# Patient Record
Sex: Female | Born: 1950 | Race: White | Hispanic: No | Marital: Married | Smoking: Never smoker
Health system: Southern US, Community
[De-identification: ages and names within clinical notes are randomized; demographics above are authoritative.]

## PROBLEM LIST (undated history)

## (undated) DIAGNOSIS — T8859XA Other complications of anesthesia, initial encounter: Secondary | ICD-10-CM

## (undated) DIAGNOSIS — K219 Gastro-esophageal reflux disease without esophagitis: Secondary | ICD-10-CM

## (undated) DIAGNOSIS — G4730 Sleep apnea, unspecified: Secondary | ICD-10-CM

## (undated) DIAGNOSIS — T4145XA Adverse effect of unspecified anesthetic, initial encounter: Secondary | ICD-10-CM

## (undated) DIAGNOSIS — E039 Hypothyroidism, unspecified: Secondary | ICD-10-CM

## (undated) DIAGNOSIS — F419 Anxiety disorder, unspecified: Secondary | ICD-10-CM

## (undated) DIAGNOSIS — M1990 Unspecified osteoarthritis, unspecified site: Secondary | ICD-10-CM

## (undated) DIAGNOSIS — K3533 Acute appendicitis with perforation and localized peritonitis, with abscess: Secondary | ICD-10-CM

## (undated) DIAGNOSIS — K3532 Acute appendicitis with perforation and localized peritonitis, without abscess: Secondary | ICD-10-CM

## (undated) HISTORY — PX: CHOLECYSTECTOMY: SHX55

## (undated) HISTORY — PX: REPLACEMENT TOTAL KNEE BILATERAL: SUR1225

## (undated) HISTORY — PX: JOINT REPLACEMENT: SHX530

## (undated) HISTORY — DX: Acute appendicitis with perforation, localized peritonitis, and gangrene, with abscess: K35.33

## (undated) HISTORY — PX: BREAST BIOPSY: SHX20

## (undated) HISTORY — PX: TONSILLECTOMY: SUR1361

## (undated) HISTORY — DX: Acute appendicitis with perforation and localized peritonitis, without abscess: K35.32

## (undated) HISTORY — PX: DILATION AND CURETTAGE OF UTERUS: SHX78

## (undated) HISTORY — PX: NASAL SEPTUM SURGERY: SHX37

## (undated) HISTORY — PX: TOTAL HIP ARTHROPLASTY: SHX124

## (undated) HISTORY — PX: APPENDECTOMY: SHX54

## (undated) SURGERY — APPENDECTOMY, LAPAROSCOPIC
Anesthesia: General

---

## 2001-04-07 ENCOUNTER — Other Ambulatory Visit: Admission: RE | Admit: 2001-04-07 | Discharge: 2001-04-07 | Payer: Self-pay | Admitting: Family Medicine

## 2004-09-15 ENCOUNTER — Ambulatory Visit: Payer: Self-pay | Admitting: General Practice

## 2004-10-26 ENCOUNTER — Ambulatory Visit: Payer: Self-pay | Admitting: Internal Medicine

## 2004-11-30 ENCOUNTER — Encounter: Admission: RE | Admit: 2004-11-30 | Discharge: 2005-02-28 | Payer: Self-pay | Admitting: Orthopedic Surgery

## 2005-04-15 ENCOUNTER — Inpatient Hospital Stay: Payer: Self-pay | Admitting: General Practice

## 2005-04-21 ENCOUNTER — Encounter: Payer: Self-pay | Admitting: Internal Medicine

## 2005-07-24 ENCOUNTER — Emergency Department: Payer: Self-pay | Admitting: Unknown Physician Specialty

## 2005-11-03 ENCOUNTER — Ambulatory Visit: Payer: Self-pay | Admitting: Internal Medicine

## 2005-11-25 ENCOUNTER — Ambulatory Visit: Payer: Self-pay | Admitting: Ophthalmology

## 2007-04-24 ENCOUNTER — Ambulatory Visit: Payer: Self-pay | Admitting: General Practice

## 2007-05-11 ENCOUNTER — Inpatient Hospital Stay: Payer: Self-pay | Admitting: General Practice

## 2007-05-18 ENCOUNTER — Encounter: Payer: Self-pay | Admitting: General Practice

## 2007-06-09 ENCOUNTER — Encounter: Payer: Self-pay | Admitting: General Practice

## 2007-07-10 ENCOUNTER — Encounter: Payer: Self-pay | Admitting: General Practice

## 2007-09-06 ENCOUNTER — Ambulatory Visit: Payer: Self-pay | Admitting: Internal Medicine

## 2008-09-09 ENCOUNTER — Ambulatory Visit: Payer: Self-pay | Admitting: Internal Medicine

## 2009-08-07 ENCOUNTER — Ambulatory Visit: Payer: Self-pay | Admitting: Neurology

## 2009-09-11 ENCOUNTER — Ambulatory Visit: Payer: Self-pay | Admitting: Internal Medicine

## 2010-11-20 ENCOUNTER — Ambulatory Visit: Payer: Self-pay | Admitting: Internal Medicine

## 2011-05-21 ENCOUNTER — Ambulatory Visit: Payer: Self-pay | Admitting: Obstetrics and Gynecology

## 2011-06-07 ENCOUNTER — Ambulatory Visit: Payer: Self-pay | Admitting: Obstetrics and Gynecology

## 2011-06-14 ENCOUNTER — Ambulatory Visit: Payer: Self-pay | Admitting: Obstetrics and Gynecology

## 2011-11-30 ENCOUNTER — Ambulatory Visit: Payer: Self-pay | Admitting: Gastroenterology

## 2012-01-13 ENCOUNTER — Ambulatory Visit: Payer: Self-pay | Admitting: Internal Medicine

## 2012-02-09 ENCOUNTER — Ambulatory Visit: Payer: Self-pay | Admitting: Internal Medicine

## 2012-05-31 ENCOUNTER — Other Ambulatory Visit: Payer: Self-pay | Admitting: Physical Medicine and Rehabilitation

## 2012-05-31 DIAGNOSIS — M545 Low back pain: Secondary | ICD-10-CM

## 2012-06-04 ENCOUNTER — Ambulatory Visit
Admission: RE | Admit: 2012-06-04 | Discharge: 2012-06-04 | Disposition: A | Discharge status: Home / Self Care | Payer: BC Managed Care – PPO | Source: Ambulatory Visit | Attending: Physical Medicine and Rehabilitation | Admitting: Physical Medicine and Rehabilitation

## 2012-06-04 DIAGNOSIS — M545 Low back pain: Secondary | ICD-10-CM

## 2012-08-08 ENCOUNTER — Ambulatory Visit: Payer: Self-pay | Admitting: Obstetrics and Gynecology

## 2012-08-08 LAB — URINALYSIS, COMPLETE
Bilirubin,UR: NEGATIVE
Glucose,UR: NEGATIVE mg/dL (ref 0–75)
Hyaline Cast: 1
Nitrite: NEGATIVE
WBC UR: 1 /HPF (ref 0–5)

## 2012-08-14 ENCOUNTER — Ambulatory Visit: Payer: Self-pay | Admitting: Obstetrics and Gynecology

## 2012-08-15 LAB — PATHOLOGY REPORT

## 2014-04-09 ENCOUNTER — Ambulatory Visit: Payer: Self-pay | Admitting: General Practice

## 2014-04-09 LAB — URINALYSIS, COMPLETE
Bilirubin,UR: NEGATIVE
Blood: NEGATIVE
Glucose,UR: NEGATIVE mg/dL (ref 0–75)
Ketone: NEGATIVE
LEUKOCYTE ESTERASE: NEGATIVE
Nitrite: NEGATIVE
PROTEIN: NEGATIVE
Ph: 7 (ref 4.5–8.0)
RBC,UR: <1 /HPF (ref 0–5)
Specific Gravity: 1.008 (ref 1.003–1.030)
WBC UR: <1 /HPF (ref 0–5)

## 2014-04-09 LAB — BASIC METABOLIC PANEL
Anion Gap: 7 (ref 7–16)
BUN: 15 mg/dL (ref 7–18)
CHLORIDE: 101 mmol/L (ref 98–107)
CREATININE: 0.79 mg/dL (ref 0.60–1.30)
Calcium, Total: 9.2 mg/dL (ref 8.5–10.1)
Co2: 30 mmol/L (ref 21–32)
Glucose: 89 mg/dL (ref 65–99)
OSMOLALITY: 276 (ref 275–301)
POTASSIUM: 3.6 mmol/L (ref 3.5–5.1)
SODIUM: 138 mmol/L (ref 136–145)

## 2014-04-09 LAB — PROTIME-INR
INR: 1
Prothrombin Time: 13.1 secs (ref 11.5–14.7)

## 2014-04-09 LAB — CBC
HCT: 42.6 % (ref 35.0–47.0)
HGB: 14.4 g/dL (ref 12.0–16.0)
MCH: 32 pg (ref 26.0–34.0)
MCHC: 33.8 g/dL (ref 32.0–36.0)
MCV: 95 fL (ref 80–100)
Platelet: 214 10*3/uL (ref 150–440)
RBC: 4.51 10*6/uL (ref 3.80–5.20)
RDW: 13.6 % (ref 11.5–14.5)
WBC: 7.2 10*3/uL (ref 3.6–11.0)

## 2014-04-09 LAB — SEDIMENTATION RATE: ERYTHROCYTE SED RATE: 15 mm/h (ref 0–30)

## 2014-04-09 LAB — APTT: ACTIVATED PTT: 36.6 s — AB (ref 23.6–35.9)

## 2014-04-09 LAB — MRSA PCR SCREENING

## 2014-04-11 LAB — URINE CULTURE

## 2014-04-22 ENCOUNTER — Inpatient Hospital Stay: Payer: Self-pay | Admitting: General Practice

## 2014-04-23 LAB — BASIC METABOLIC PANEL
ANION GAP: 5 — AB (ref 7–16)
BUN: 7 mg/dL (ref 7–18)
CREATININE: 0.8 mg/dL (ref 0.60–1.30)
Calcium, Total: 8 mg/dL — ABNORMAL LOW (ref 8.5–10.1)
Chloride: 97 mmol/L — ABNORMAL LOW (ref 98–107)
Co2: 29 mmol/L (ref 21–32)
EGFR (African American): >60
GLUCOSE: 110 mg/dL — AB (ref 65–99)
OSMOLALITY: 261 (ref 275–301)
Potassium: 3.9 mmol/L (ref 3.5–5.1)
SODIUM: 131 mmol/L — AB (ref 136–145)

## 2014-04-23 LAB — HEMOGLOBIN: HGB: 11.6 g/dL — ABNORMAL LOW (ref 12.0–16.0)

## 2014-04-23 LAB — PLATELET COUNT: PLATELETS: 187 10*3/uL (ref 150–440)

## 2014-04-24 LAB — BASIC METABOLIC PANEL
Anion Gap: 6 — ABNORMAL LOW (ref 7–16)
BUN: 6 mg/dL — AB (ref 7–18)
CALCIUM: 7.9 mg/dL — AB (ref 8.5–10.1)
CO2: 28 mmol/L (ref 21–32)
Chloride: 99 mmol/L (ref 98–107)
Creatinine: 0.74 mg/dL (ref 0.60–1.30)
Glucose: 106 mg/dL — ABNORMAL HIGH (ref 65–99)
Osmolality: 264 (ref 275–301)
Potassium: 3.7 mmol/L (ref 3.5–5.1)
SODIUM: 133 mmol/L — AB (ref 136–145)

## 2014-04-24 LAB — HEMOGLOBIN: HGB: 11.8 g/dL — ABNORMAL LOW (ref 12.0–16.0)

## 2014-04-24 LAB — PLATELET COUNT: PLATELETS: 191 10*3/uL (ref 150–440)

## 2014-04-25 ENCOUNTER — Encounter: Payer: Self-pay | Admitting: Internal Medicine

## 2014-04-27 LAB — URINALYSIS, COMPLETE
BILIRUBIN, UR: NEGATIVE
Blood: NEGATIVE
Glucose,UR: NEGATIVE mg/dL (ref 0–75)
Ketone: NEGATIVE
Leukocyte Esterase: NEGATIVE
Nitrite: NEGATIVE
PH: 6 (ref 4.5–8.0)
Protein: NEGATIVE
RBC, UR: NONE SEEN /HPF (ref 0–5)
Specific Gravity: 1.016 (ref 1.003–1.030)
Squamous Epithelial: 2

## 2014-04-30 LAB — URINE CULTURE

## 2014-05-08 ENCOUNTER — Encounter: Payer: Self-pay | Admitting: Internal Medicine

## 2014-06-08 ENCOUNTER — Encounter: Payer: Self-pay | Admitting: Internal Medicine

## 2014-08-07 DIAGNOSIS — Z8639 Personal history of other endocrine, nutritional and metabolic disease: Secondary | ICD-10-CM | POA: Insufficient documentation

## 2014-08-07 DIAGNOSIS — N189 Chronic kidney disease, unspecified: Secondary | ICD-10-CM | POA: Insufficient documentation

## 2014-08-07 DIAGNOSIS — E669 Obesity, unspecified: Secondary | ICD-10-CM | POA: Insufficient documentation

## 2014-08-07 DIAGNOSIS — G4730 Sleep apnea, unspecified: Secondary | ICD-10-CM | POA: Insufficient documentation

## 2014-08-07 DIAGNOSIS — M1990 Unspecified osteoarthritis, unspecified site: Secondary | ICD-10-CM | POA: Insufficient documentation

## 2014-08-14 DIAGNOSIS — M5416 Radiculopathy, lumbar region: Secondary | ICD-10-CM | POA: Insufficient documentation

## 2014-08-14 DIAGNOSIS — M5136 Other intervertebral disc degeneration, lumbar region: Secondary | ICD-10-CM | POA: Insufficient documentation

## 2014-11-04 ENCOUNTER — Other Ambulatory Visit: Payer: Self-pay | Admitting: Physical Medicine and Rehabilitation

## 2014-11-04 DIAGNOSIS — M5416 Radiculopathy, lumbar region: Secondary | ICD-10-CM

## 2014-11-16 ENCOUNTER — Ambulatory Visit
Admission: RE | Admit: 2014-11-16 | Discharge: 2014-11-16 | Disposition: A | Discharge status: Home / Self Care | Payer: Medicare Other | Source: Ambulatory Visit | Attending: Physical Medicine and Rehabilitation | Admitting: Physical Medicine and Rehabilitation

## 2014-11-16 DIAGNOSIS — M5416 Radiculopathy, lumbar region: Secondary | ICD-10-CM

## 2015-02-25 NOTE — Op Note (Signed)
PATIENT NAME:  NONA, GRACEY MR#:  [id number] DATE OF BIRTH:  10/28/1951  DATE OF PROCEDURE:  08/14/2012  PREOPERATIVE DIAGNOSIS: Postmenopausal bleeding and suspected polyps.   POSTOPERATIVE DIAGNOSIS: Postmenopausal bleeding and suspected polyps.   PROCEDURES:  1. Dilation and curettage.  2. Hysteroscopy.  3. NovaSure ablation.   SURGEON: Ricky L. Amalia Hailey, M.D.   ANESTHESIA: General orotracheal.   FINDINGS: Stenotic cervix. Cavity with possible small polyp at 12 o'clock, at the internal os and one possibly at the left posterior mid uterus.   SPECIMENS: Endometrial curettings.  COMPLICATIONS: Small single-tooth laceration of the cervix controlled with figure-of-eight of 0 chromic.   DRAINS: In and out at end of case with a red rubber.   DESCRIPTION OF PROCEDURE: The patient consented, preoperative antibiotics given. She was taken to the operating room and placed in the supine position where anesthesia was initiated and then placed in the dorsal lithotomy position using Allen stirrups. She was prepped and draped in the usual sterile fashion. The cervix was visualized and grasped with a single-tooth tenaculum and with some difficulty was able to dilate the cervix. Had to alternate between hysteroscope to find the cervical canal and one arm of a Kelly. There was a persistent obstruction at 12 o'clock which is either a polyp or fibroid which after dilation and curettage this area was shaved enough to permit the introduction of the NovaSure device which showed a cavity length of 4 cm and a width of 2.5 and cycle was carried out over approximately 65 seconds.   The instruments were removed. There was a small laceration seen at 12 o'clock  from the single-tooth tenaculum, of the cervix which, was controlled with 0 chromic figure-of-eight. All instrument, needle, and sponge counts were correct. The patient tolerated the procedure well. I anticipate a routine postoperative course.   ____________________________ Rockey Situ. Amalia Hailey, MD rle:slb D: 08/14/2012 08:25:59 ET T: 08/14/2012 09:02:55 ET JOB#: [id number]  cc: Ricky L. Amalia Hailey, MD, <Dictator> Selmer Dominion MD ELECTRONICALLY SIGNED 08/15/2012 8:44

## 2015-03-01 NOTE — Discharge Summary (Signed)
PATIENT NAME:  Kelsey Woods, WITTING MR#:  [id number] DATE OF BIRTH:  1951/01/20  DATE OF ADMISSION:  04/22/2014 DATE OF DISCHARGE:  04/25/2014  DICTATING FOR: Jeneen Rinks P. Holley Bouche., MD  ADMITTING DIAGNOSIS: Degenerative arthrosis of the left knee.   DISCHARGE DIAGNOSIS: Degenerative arthrosis of the left knee.   HISTORY: The patient is a pleasant 64 year old female who has been followed at [facility] for progression of left knee pain. The patient received a series of Synvisc injections in the past, with modest improvement of her symptoms. She had reported start-up stiffness as well as easy fatigability. The pain was noted to be worse with prolonged standing or weight-bearing activities. At the time of surgery, she was not using any ambulatory aid. She has been unable to walk any significant distance due to progressive left knee pain. Her pain had progressed to the point that it was significantly interfering with her activities of daily living. X-rays taken in [facility] showed narrowing of the lateral cartilage space with bone-on-bone articulation and being associated with valgus alignment. Osteophyte as well as subchondral sclerosis were noted. After discussion of the risks and benefits of surgical intervention, the patient expressed her understanding of the risks and benefits and agreed for plans for surgical intervention.   PROCEDURE: Left total knee arthroplasty using computer-assisted navigation.   ANESTHESIA: Spinal.   SOFT TISSUE RELEASE: Anterior cruciate ligament, posterior cruciate ligament, deep medial collateral ligament, patellofemoral ligament as well as posterolateral corner.   IMPLANTS UTILIZED: DePuy PFC Sigma size 4 narrow posterior stabilized femoral component (cemented), size 3 MBT tibial component (cemented), 32 mm 3 pegged oval dome patella (cemented), and a 10 mm stabilized rotating platform polyethylene insert. Gentamicin cement was utilized.   HOSPITAL COURSE: The  patient tolerated the procedure very well. She had no complications. She was then taken to the PACU, where she was stabilized and then transferred to the orthopedic floor. She began receiving anticoagulation therapy of Lovenox 30 mg subcutaneous q.12 hours. The patient was also fitted with the AVI compression foot pumps set at 80 mmHg. Her calves have been nontender. There has been no evidence of any DVTs. Negative Homans sign. Heels were elevated off the bed.   The patient has denied any chest pain or shortness of breath. Vital signs have been stable. She has been afebrile. Hemodynamically, she was stable, and no transfusions were given.   Physical therapy was initiated on day 1 for gait training and transfers. She has done very well. She has had no complications. However, she did not meet requirements for going home and subsequently was recommended short-term rehab. Occupational therapy was also initiated on day 1 for ADLs and assistive devices.   The patient's IV, Foley and Hemovac were discontinued on day 2 along with a dressing change. The wound was free of any drainage or signs of infection. Polar Care was reapplied to the surgical leg, maintaining a temperature of 40 to 50 degrees Fahrenheit.   DISPOSITION: The patient is being discharged to skilled nursing facility in improved stable condition.   DISCHARGE INSTRUCTIONS: She will continue weight-bearing as tolerated. Continue with PT for gait training and transfers. OT for ADLs and assistive devices. Will continue with TED stockings. These are allowed to be removed 1 hour per 8-hour shift. Also recommended that she continue incentive spirometer q.1 hour while awake. Also encouraged the patient to do coughing and deep breathing q.2 hours while awake. She is placed on a diabetic diet.  Also continue with the Polar  Care, maintaining a temperature of 40 to 50 degrees Fahrenheit. Was instructed on wound care. She has a followup appointment with [facility] on June 30th at 8:45. Call the clinic sooner if any complications.   DRUG ALLERGIES: ALEVE, MORPHINE, SULFA DRUGS, VIOXX, RICE, GRAPES, SUGAR, COLA, MALT, EGG, SOY.    MEDICATIONS:  1. Fluconazole nasal spray 1 spray in each nostril b.i.d. 2. Vitamin B12 1 tablet daily.  3. Sertraline 50 mg at bedtime.  4. Saline nasal mist 2 sprays to each nostril daily as needed.  5. Tramadol 50 to 100 mg q.4-6 hours p.r.n. for pain. 6. Lovenox 30 mg subcutaneous q.12 hours for 14 days, then discontinue and begin taking one 81 mg enteric-coated aspirin unless contraindicated.  7. Oxycodone 5 to 10 mg q.4-6 hours p.r.n. for pain. 8. OxyContin 10 mg q.12 hours.  9. Tylenol 500 to 1000 mg q.4-6 hours p.r.n. for temperatures of 100.4 or greater.  10. Milk of Magnesia 30 mL b.i.d. p.r.n.  11. Senokot-S 1 tablet b.i.d.  12. Skelaxin 800 mg 1 tablet every 8 hours p.r.n. 13. Pantoprazole 40 mg b.i.d.   PAST MEDICAL HISTORY: 1. Depression/anxiety.  2. Hypothyroidism.  3. Shingles.  4. Obesity.  5. Sleep apnea, for which she uses CPAP.  6. Chronic limb movements.   ____________________________ Vance Peper, PA jrw:lb D: 05/02/2014 08:19:01 ET T: 05/02/2014 08:42:14 ET JOB#: [id number]  cc: Vance Peper, PA, <Dictator> JON WOLFE PA ELECTRONICALLY SIGNED 05/05/2014 7:20

## 2015-03-01 NOTE — Op Note (Signed)
PATIENT NAME:  Kelsey Woods, BOXX MR#:  [id number] DATE OF BIRTH:  Feb 20, 1951  DATE OF PROCEDURE:  04/22/2014  PREOPERATIVE DIAGNOSIS: Degenerative arthrosis of the left knee.   POSTOPERATIVE DIAGNOSIS: Degenerative arthrosis of the left knee.   PROCEDURE PERFORMED: Left total knee arthroplasty using computer-assisted navigation.   SURGEON: Dr. Skip Estimable  ASSISTANT:  Vance Peper, PA (required to maintain retraction throughout the procedure)   ANESTHESIA: Spinal.   ESTIMATED BLOOD LOSS: 100 mL.   FLUIDS REPLACED: 2000 mL of crystalloid.   TOURNIQUET TIME: 109 minutes.   DRAINS: Two medium drains to reinfusion system.   SOFT TISSUE RELEASES: Anterior cruciate ligament, posterior cruciate ligament, deep medial collateral ligament, patellofemoral ligament, and posterolateral corner.   IMPLANTS UTILIZED: DePuy PFC Sigma size 4N (narrow) posterior stabilized femoral component (cemented), size 3 MBT tibial component (cemented), 32 mm 3 peg oval dome patella (cemented), and a 10 mm stabilized rotating platform polyethylene insert. Gentamicin cement was utilized.   INDICATIONS FOR SURGERY: The patient is a 64 year old female who has been seen for complaints of progressive left knee pain. X-rays demonstrated severe degenerative changes in tricompartmental fashion with relative valgus deformity. After discussion of the risks and benefits of surgical intervention, the patient expressed understanding of the risks and benefits and agreed with plans for surgical intervention.   PROCEDURE IN DETAIL: The patient was brought into the operating room, and after adequate spinal anesthesia was achieved, a tourniquet was placed on the patient's upper left thigh. The patient's left knee and leg were cleaned and prepped with alcohol and DuraPrep draped in the usual sterile fashion. A "timeout" was performed as per usual protocol. The left lower extremity was exsanguinated using an Esmarch, the tourniquet was  inflated to 300 mmHg. An anterior longitudinal incision was made followed by a standard mid vastus approach. A large effusion was evacuated. The deep fibers of the medial collateral ligament were elevated in a subperiosteal fashion off the medial flare of the tibia so as to maintain a continuous soft tissue sleeve. The patella was subluxed laterally and the patellofemoral ligament was incised. Inspection of the knee demonstrates severe degenerative changes in tricompartmental fashion with full-thickness loss of articular cartilage to the lateral compartment. Prominent osteophytes were debrided using a rongeur. Anterior and posterior cruciate ligaments were excised. Two 4.0 mm Schanz pins were inserted into the femur and into the tibia for attachment of the ray of trackers used for computer-assisted navigation. Hip center was identified using circumduction technique. Distal landmarks were mapped using the computer. The distal femur and proximal tibia were mapped using the computer. The distal femoral cutting guide was positioned using computer-assisted navigation so as to achieve a 5 degrees distal valgus cut. Cut was performed and verified using the computer. Distal femur was sized and it was felt that a size 4N (narrow) femoral component was appropriate. A size 4 cutting guide was positioned and the anterior cut was performed and verified using the computer. This was followed by completion of the posterior and chamfer cuts. Femoral cutting guide for the central box was then positioned and the central box cut was performed. Attention was then directed to the proximal tibia. Medial and lateral menisci were excised. The extramedullary tibial cutting guide was positioned using computer-assisted navigation so as to achieve 0 degree varus valgus alignment and 0 degree posterior slope. Cut was performed and verified using the computer. The proximal tibia was sized and it was felt that a size 3 tibial tray was appropriate.  Tibial  and femoral trials were inserted followed by insertion of a 10 mm polyethylene insert.  The knee was tight laterally and there was difficulty with achieving full extension. The extramedullary tibial cutting guide was positioned and an additional 2 mm of bone was resected from the proximal tibia. The knee was then brought into full extension and distracted using the Moreland retractors. The posterolateral corner was carefully released using a combination of electrocautery and Metzenbaum scissors. This allowed for excellent mediolateral soft tissue balancing. The tibial and femoral trials were reinserted followed by insertion of a 10 mm polyethylene insert. This allowed for excellent mediolateral soft tissue balancing both in full extension and in flexion. Finally, the  patella was cut and prepared so as to accommodate a 32 mm 3 peg oval dome patella. Patellar trial was placed and the knee was placed through a range of motion with excellent patellar tracking appreciated.  Femoral trial was removed. Central post hole for the tibial component was reamed followed by insertion of a keel punch. Tibial trials were then removed.  Cut surfaces of the bone were irrigated with copious amounts of normal saline with antibiotic solution using pulsatile lavage and then suctioned dry. Polymethylmethacrylate cement with gentamicin was prepared in the usual fashion using a vacuum mixer. Cement was applied to the cut surface of the proximal tibia as well as on the undersurface of a size 3 MBT tibial component. The tibial component was positioned and impacted into place. Excess cement was removed using freer elevators. Cement was then applied to the cut surface of the femur as well as on the posterior flanges of a size 4N (narrow) posterior stabilized femoral component. Femoral component was positioned and impacted into place. Excess cement was removed using freer elevators. A 10 mm polyethylene insert was inserted and the knee  was brought into full extension with steady axial compression applied. Finally, cement was applied to the back side of a 32 mm 3 peg oval dome patella, and the patellar component was positioned, and patellar clamp applied. Excess cement was removed using freer elevators.   After adequate curing of cement, the tourniquet was deflated after a total tourniquet time of 109 minutes. Hemostasis was achieved using electrocautery. The knee was irrigated with copious amounts of normal saline with antibiotic solution using pulsatile lavage and then suctioned dry. The knee was inspected for any residual cement debris, 20 mL of 1.3% Exparel and 40 mL of normal saline was injected along the posterior capsule, medial and lateral gutters, and along the arthrotomy site. A 10 mm stabilized rotating platform polyethylene insert was inserted and the knee was placed through range of motion. Excellent mediolateral soft tissue balancing was appreciated and excellent patellar tracking appreciated. Two medium drains were placed in the wound bed and brought through separate stab incision to be attached to reinfusion system. The medial parapatellar portion of sutures were reapproximated using interrupted sutures of #1 Vicryl. The subcutaneous tissue was approximated in layers using first #0 Vicryl followed by #2-0 Vicryl. Then, 30 mL of 0.25% Marcaine with epinephrine was injected along the incision site into the subcutaneous tissue. Skin was then reapproximated using skin staples. Sterile dressing was applied. The patient tolerated the procedure well. She was transported to the recovery room in stable condition.    ____________________________ Laurice Record. Holley Bouche., MD jph:dd D: 04/23/2014 00:14:57 ET T: 04/23/2014 04:48:38 ET JOB#: [id number]  cc: Laurice Record. Holley Bouche., MD, <Dictator> JAMES P Holley Bouche MD ELECTRONICALLY SIGNED 04/26/2014 16:17

## 2015-09-24 ENCOUNTER — Other Ambulatory Visit: Payer: Self-pay | Admitting: Obstetrics and Gynecology

## 2015-09-24 DIAGNOSIS — Z1231 Encounter for screening mammogram for malignant neoplasm of breast: Secondary | ICD-10-CM

## 2015-09-30 ENCOUNTER — Ambulatory Visit: Payer: PRIVATE HEALTH INSURANCE

## 2015-10-08 ENCOUNTER — Other Ambulatory Visit: Payer: Self-pay | Admitting: Obstetrics and Gynecology

## 2015-10-08 ENCOUNTER — Ambulatory Visit
Admission: RE | Admit: 2015-10-08 | Discharge: 2015-10-08 | Disposition: A | Discharge status: Home / Self Care | Payer: Medicare Other | Source: Ambulatory Visit | Attending: Obstetrics and Gynecology | Admitting: Obstetrics and Gynecology

## 2015-10-08 DIAGNOSIS — Z1231 Encounter for screening mammogram for malignant neoplasm of breast: Secondary | ICD-10-CM | POA: Diagnosis not present

## 2016-07-13 ENCOUNTER — Emergency Department
Admission: EM | Admit: 2016-07-13 | Discharge: 2016-07-13 | Disposition: A | Discharge status: Home / Self Care | Payer: Medicare Other | Attending: Emergency Medicine | Admitting: Emergency Medicine

## 2016-07-13 DIAGNOSIS — Z79899 Other long term (current) drug therapy: Secondary | ICD-10-CM | POA: Insufficient documentation

## 2016-07-13 DIAGNOSIS — R220 Localized swelling, mass and lump, head: Secondary | ICD-10-CM | POA: Diagnosis present

## 2016-07-13 DIAGNOSIS — T783XXA Angioneurotic edema, initial encounter: Secondary | ICD-10-CM | POA: Diagnosis not present

## 2016-07-13 DIAGNOSIS — Z791 Long term (current) use of non-steroidal anti-inflammatories (NSAID): Secondary | ICD-10-CM | POA: Insufficient documentation

## 2016-07-13 HISTORY — DX: Unspecified osteoarthritis, unspecified site: M19.90

## 2016-07-13 MED ORDER — PREDNISONE 50 MG PO TABS: 50.0000 mg | ORAL_TABLET | Freq: Every day | ORAL | 0 refills | Status: DC

## 2016-07-13 MED ORDER — FAMOTIDINE IN NACL 20-0.9 MG/50ML-% IV SOLN
20.0000 mg | Freq: Once | INTRAVENOUS | Status: AC
  Administered 2016-07-13: 20 mg via INTRAVENOUS

## 2016-07-13 MED ORDER — SODIUM CHLORIDE 0.9 % IV SOLN
1000.0000 mL | Freq: Once | INTRAVENOUS | Status: AC
  Administered 2016-07-13: 1000 mL via INTRAVENOUS

## 2016-07-13 MED ORDER — DIPHENHYDRAMINE HCL 50 MG/ML IJ SOLN
25.0000 mg | Freq: Once | INTRAMUSCULAR | Status: AC
  Administered 2016-07-13: 25 mg via INTRAVENOUS

## 2016-07-13 MED ORDER — METHYLPREDNISOLONE SODIUM SUCC 125 MG IJ SOLR
125.0000 mg | Freq: Once | INTRAMUSCULAR | Status: AC
  Administered 2016-07-13: 125 mg via INTRAVENOUS

## 2016-07-13 NOTE — ED Triage Notes (Signed)
Pt arrived via POV for c/o tongue swelling - tongue is noted to be swollen and thick - speech is thick and she has to speak slowly to get words out - pt has had this happen before but unknown cause - denies pain - has been dizzy for several days but denies any other symptoms - states no new medication or foods - MD at bedside

## 2016-07-13 NOTE — ED Notes (Signed)
Pt arrived via POV for c/o tongue swelling - tongue is noted to be swollen and thick - speech is thick and she has to speak slowly to get words out - pt has had this happen before but unknown cause - denies pain - has been dizzy for several days but denies any other symptoms - states no new medication or foods

## 2016-07-13 NOTE — ED Notes (Signed)
Pt remains without shortness of breath and no difficulty breathing - tongue remains swollen but pt denies any swelling or fullness in throat - skin warm/dry - color race appropriate

## 2016-07-13 NOTE — ED Notes (Signed)
Pt tongue appears to be decreasing in size and she states that it feels some better - continues without difficulty breathing or swallowing

## 2016-07-13 NOTE — ED Provider Notes (Signed)
[facility] Emergency Department Provider Note   ____________________________________________    I have reviewed the triage vital signs and the nursing notes.   HISTORY  Chief Complaint Edema     HPI Kelsey Woods is a 65 y.o. female who presents with complaints of tongue swelling. Patient reports she noticed swelling to the left side of her tongue which started approximately 2 hours prior to arrival. She reports she has had swelling of the lips in the past. She is not on lisinopril. She denies throat involvement or difficulty breathing. She did take an Ativan last night to help her sleep. No fevers or chills. No dental pain.   Past Medical History:  Diagnosis Date  . Arthritis     There are no active problems to display for this patient.   Past Surgical History:  Procedure Laterality Date  . CESAREAN SECTION    . CHOLECYSTECTOMY    . REPLACEMENT TOTAL KNEE BILATERAL    . TOTAL HIP ARTHROPLASTY      Prior to Admission medications   Not on File     Allergies Aleve [naproxen sodium]  No family history on file.  Social History Social History  Substance Use Topics  . Smoking status: Never Smoker  . Smokeless tobacco: Never Used  . Alcohol use Yes     Comment: weekends    Review of Systems  Constitutional: No fever/chills  ENT: No Throat swelling or difficulty swallowing Cardiovascular: Denies chest pain. Respiratory: Denies shortness of breath. Gastrointestinal: No nausea, no vomiting.     Skin: Negative for rash. Neurological: Negative for headaches or weakness  10-point ROS otherwise negative.  ____________________________________________   PHYSICAL EXAM:  VITAL SIGNS: ED Triage Vitals  Enc Vitals Group     BP 07/13/16 1136 133/61     Pulse Rate 07/13/16 1136 72     Resp 07/13/16 1136 14     Temp --      Temp src --      SpO2 07/13/16 1136 99 %     Weight 07/13/16 1124 190 lb (86.2 kg)     Height  07/13/16 1124 5\' 6"  (1.676 m)     Head Circumference --      Peak Flow --      Pain Score 07/13/16 1124 0     Pain Loc --      Pain Edu? --      Excl. in [place]? --     Constitutional: Alert and oriented. No acute distress. Pleasant and interactive Eyes: Conjunctivae are normal.  Head: Atraumatic. Nose: No congestion/rhinnorhea. Mouth/Throat: Mucous membranes are moist.  Left side of tongue is swollen, no pharyngeal swelling, no stridor Neck:  Painless ROM Cardiovascular: Normal rate, regular rhythm. Grossly normal heart sounds.  Good peripheral circulation. Respiratory: Normal respiratory effort.  No retractions. Lungs CTAB. Gastrointestinal: Soft and nontender. No distention.   Genitourinary: deferred Musculoskeletal: No lower extremity tenderness nor edema.  Neurologic:  Normal speech and language. No gross focal neurologic deficits are appreciated.  Skin:  Skin is warm, dry and intact. No rash noted. Psychiatric: Mood and affect are normal. Speech and behavior are normal.  ____________________________________________   LABS (all labs ordered are listed, but only abnormal results are displayed)  Labs Reviewed - No data to display ____________________________________________  EKG  None ____________________________________________  RADIOLOGY  None ____________________________________________   PROCEDURES  Procedure(s) performed: No    Critical Care performed: No ____________________________________________   INITIAL IMPRESSION / ASSESSMENT AND  PLAN / ED COURSE  Pertinent labs & imaging results that were available during my care of the patient were reviewed by me and considered in my medical decision making (see chart for details).  Patient presents with left-sided tongue swelling. We will give IV Solu-Medrol, IV Benadryl, IV Pepcid and closely observe in the emergency department. At this time no airway involvement.  Clinical Course  Patient reports improving  symptoms  ----------------------------------------- 3:07 PM on 07/13/2016 ----------------------------------------- Tongue swelling has resolved completely on my exam. Patient is appropriate for discharge at this time  ____________________________________________   FINAL CLINICAL IMPRESSION(S) / ED DIAGNOSES  Final diagnoses:  Angioedema, initial encounter      NEW MEDICATIONS STARTED DURING THIS VISIT:  New Prescriptions   No medications on file     Note:  This document was prepared using Dragon voice recognition software and may include unintentional dictation errors.    Lavonia Drafts, MD 07/13/16 [id number]

## 2016-07-13 NOTE — ED Notes (Signed)
Pt is A&O x3 - respirations even and unlabored - denies shortness of breath or swelling in throat - denies fullness in throat - tongue remains swollen and thick but not increased from admission - pt spouse at bedside

## 2016-07-27 DIAGNOSIS — E8809 Other disorders of plasma-protein metabolism, not elsewhere classified: Secondary | ICD-10-CM | POA: Insufficient documentation

## 2016-07-27 DIAGNOSIS — E756 Lipid storage disorder, unspecified: Secondary | ICD-10-CM | POA: Insufficient documentation

## 2016-09-28 ENCOUNTER — Other Ambulatory Visit: Payer: Self-pay | Admitting: Obstetrics and Gynecology

## 2016-09-28 DIAGNOSIS — Z1231 Encounter for screening mammogram for malignant neoplasm of breast: Secondary | ICD-10-CM

## 2016-11-17 ENCOUNTER — Ambulatory Visit
Admission: RE | Admit: 2016-11-17 | Discharge: 2016-11-17 | Disposition: A | Discharge status: Home / Self Care | Payer: Medicare Other | Source: Ambulatory Visit | Attending: Obstetrics and Gynecology | Admitting: Obstetrics and Gynecology

## 2016-11-17 DIAGNOSIS — Z1231 Encounter for screening mammogram for malignant neoplasm of breast: Secondary | ICD-10-CM | POA: Insufficient documentation

## 2017-03-17 DIAGNOSIS — G5603 Carpal tunnel syndrome, bilateral upper limbs: Secondary | ICD-10-CM | POA: Insufficient documentation

## 2017-11-28 ENCOUNTER — Inpatient Hospital Stay
Admission: EM | Admit: 2017-11-28 | Discharge: 2017-12-02 | DRG: 373 | Disposition: A | Discharge status: Home / Self Care | Payer: Medicare Other | Attending: General Surgery | Admitting: General Surgery

## 2017-11-28 ENCOUNTER — Other Ambulatory Visit: Payer: Self-pay

## 2017-11-28 ENCOUNTER — Emergency Department: Payer: Medicare Other

## 2017-11-28 ENCOUNTER — Encounter: Payer: Self-pay | Admitting: Emergency Medicine

## 2017-11-28 DIAGNOSIS — Z96653 Presence of artificial knee joint, bilateral: Secondary | ICD-10-CM | POA: Diagnosis present

## 2017-11-28 DIAGNOSIS — K3533 Acute appendicitis with perforation and localized peritonitis, with abscess: Principal | ICD-10-CM

## 2017-11-28 DIAGNOSIS — Z96649 Presence of unspecified artificial hip joint: Secondary | ICD-10-CM | POA: Diagnosis present

## 2017-11-28 DIAGNOSIS — Z9049 Acquired absence of other specified parts of digestive tract: Secondary | ICD-10-CM

## 2017-11-28 DIAGNOSIS — K3532 Acute appendicitis with perforation and localized peritonitis, without abscess: Secondary | ICD-10-CM

## 2017-11-28 DIAGNOSIS — Z79899 Other long term (current) drug therapy: Secondary | ICD-10-CM | POA: Diagnosis not present

## 2017-11-28 DIAGNOSIS — Z7982 Long term (current) use of aspirin: Secondary | ICD-10-CM | POA: Diagnosis not present

## 2017-11-28 HISTORY — DX: Acute appendicitis with perforation, localized peritonitis, and gangrene, without abscess: K35.32

## 2017-11-28 LAB — COMPREHENSIVE METABOLIC PANEL
ALBUMIN: 2.7 g/dL — AB (ref 3.5–5.0)
ALT: 10 U/L — ABNORMAL LOW (ref 14–54)
ANION GAP: 9 (ref 5–15)
AST: 19 U/L (ref 15–41)
Alkaline Phosphatase: 78 U/L (ref 38–126)
BUN: 9 mg/dL (ref 6–20)
CO2: 27 mmol/L (ref 22–32)
Calcium: 8 mg/dL — ABNORMAL LOW (ref 8.9–10.3)
Chloride: 97 mmol/L — ABNORMAL LOW (ref 101–111)
Creatinine, Ser: 0.72 mg/dL (ref 0.44–1.00)
GFR calc Af Amer: >60 mL/min (ref >60)
GFR calc non Af Amer: >60 mL/min (ref >60)
GLUCOSE: 107 mg/dL — AB (ref 65–99)
POTASSIUM: 3.5 mmol/L (ref 3.5–5.1)
SODIUM: 133 mmol/L — AB (ref 135–145)
Total Bilirubin: 0.7 mg/dL (ref 0.3–1.2)
Total Protein: 7 g/dL (ref 6.5–8.1)

## 2017-11-28 LAB — CBC
HCT: 36.4 % (ref 35.0–47.0)
Hemoglobin: 11.9 g/dL — ABNORMAL LOW (ref 12.0–16.0)
MCH: 31.2 pg (ref 26.0–34.0)
MCHC: 32.8 g/dL (ref 32.0–36.0)
MCV: 95.3 fL (ref 80.0–100.0)
PLATELETS: 329 10*3/uL (ref 150–440)
RBC: 3.82 MIL/uL (ref 3.80–5.20)
RDW: 13.2 % (ref 11.5–14.5)
WBC: 11.7 10*3/uL — ABNORMAL HIGH (ref 3.6–11.0)

## 2017-11-28 LAB — URINALYSIS, COMPLETE (UACMP) WITH MICROSCOPIC
Glucose, UA: NEGATIVE mg/dL
Hgb urine dipstick: NEGATIVE
KETONES UR: NEGATIVE mg/dL
Leukocytes, UA: NEGATIVE
Nitrite: NEGATIVE
Protein, ur: 30 mg/dL — AB
SPECIFIC GRAVITY, URINE: 1.034 — AB (ref 1.005–1.030)
pH: 5 (ref 5.0–8.0)

## 2017-11-28 LAB — LIPASE, BLOOD: Lipase: 20 U/L (ref 11–51)

## 2017-11-28 MED ORDER — ONDANSETRON HCL 4 MG/2ML IJ SOLN: 4.0000 mg | Freq: Four times a day (QID) | INTRAMUSCULAR | Status: DC | PRN

## 2017-11-28 MED ORDER — MORPHINE SULFATE (PF) 4 MG/ML IV SOLN
4.0000 mg | INTRAVENOUS | Status: DC | PRN
  Administered 2017-11-29 – 2017-11-30 (×3): 4 mg via INTRAVENOUS
  Filled 2017-11-28 (×3): qty 1

## 2017-11-28 MED ORDER — ONDANSETRON 4 MG PO TBDP: 4.0000 mg | ORAL_TABLET | Freq: Four times a day (QID) | ORAL | Status: DC | PRN

## 2017-11-28 MED ORDER — LACTATED RINGERS IV SOLN
INTRAVENOUS | Status: DC
  Administered 2017-11-28 – 2017-12-01 (×7): via INTRAVENOUS

## 2017-11-28 MED ORDER — DIPHENHYDRAMINE HCL 12.5 MG/5ML PO ELIX
12.5000 mg | ORAL_SOLUTION | Freq: Four times a day (QID) | ORAL | Status: DC | PRN
  Filled 2017-11-28: qty 5

## 2017-11-28 MED ORDER — PIPERACILLIN-TAZOBACTAM 3.375 G IVPB 30 MIN
3.3750 g | Freq: Once | INTRAVENOUS | Status: AC
  Administered 2017-11-28: 3.375 g via INTRAVENOUS
  Filled 2017-11-28: qty 50

## 2017-11-28 MED ORDER — DIPHENHYDRAMINE HCL 50 MG/ML IJ SOLN: 12.5000 mg | Freq: Four times a day (QID) | INTRAMUSCULAR | Status: DC | PRN

## 2017-11-28 MED ORDER — SODIUM CHLORIDE 0.9 % IV BOLUS (SEPSIS)
1000.0000 mL | Freq: Once | INTRAVENOUS | Status: AC
Start: 2017-11-28 — End: 2017-11-28
  Administered 2017-11-28: 1000 mL via INTRAVENOUS

## 2017-11-28 MED ORDER — FENTANYL CITRATE (PF) 100 MCG/2ML IJ SOLN
100.0000 ug | Freq: Once | INTRAMUSCULAR | Status: AC
  Administered 2017-11-28: 100 ug via INTRAVENOUS
  Filled 2017-11-28: qty 2

## 2017-11-28 MED ORDER — PANTOPRAZOLE SODIUM 40 MG IV SOLR
40.0000 mg | Freq: Every day | INTRAVENOUS | Status: DC
  Administered 2017-11-28 – 2017-12-01 (×4): 40 mg via INTRAVENOUS
  Filled 2017-11-28 (×4): qty 40

## 2017-11-28 MED ORDER — IOPAMIDOL (ISOVUE-300) INJECTION 61%
100.0000 mL | Freq: Once | INTRAVENOUS | Status: AC | PRN
  Administered 2017-11-28: 100 mL via INTRAVENOUS
  Filled 2017-11-28: qty 100

## 2017-11-28 MED ORDER — ONDANSETRON HCL 4 MG/2ML IJ SOLN
4.0000 mg | Freq: Once | INTRAMUSCULAR | Status: AC
  Administered 2017-11-28: 4 mg via INTRAVENOUS
  Filled 2017-11-28: qty 2

## 2017-11-28 MED ORDER — PIPERACILLIN-TAZOBACTAM 3.375 G IVPB
3.3750 g | Freq: Three times a day (TID) | INTRAVENOUS | Status: DC
  Administered 2017-11-29 – 2017-12-01 (×8): 3.375 g via INTRAVENOUS
  Filled 2017-11-28 (×8): qty 50

## 2017-11-28 MED ORDER — HYDRALAZINE HCL 20 MG/ML IJ SOLN: 10.0000 mg | INTRAMUSCULAR | Status: DC | PRN

## 2017-11-28 MED ORDER — FENTANYL CITRATE (PF) 100 MCG/2ML IJ SOLN
INTRAMUSCULAR | Status: AC
  Filled 2017-11-28: qty 2

## 2017-11-28 MED ORDER — KETOROLAC TROMETHAMINE 30 MG/ML IJ SOLN
30.0000 mg | Freq: Four times a day (QID) | INTRAMUSCULAR | Status: DC | PRN
  Administered 2017-11-29 – 2017-12-01 (×4): 30 mg via INTRAVENOUS
  Filled 2017-11-28 (×4): qty 1

## 2017-11-28 NOTE — ED Triage Notes (Addendum)
R lower abdominal pain x 3 weeks. Has been seeing Dr Edwina Barth at [facility]. Began fevers 2 weeks ago.

## 2017-11-28 NOTE — ED Provider Notes (Signed)
[facility] Emergency Department Provider Note  ____________________________________________  Time seen: Approximately 7:58 PM  I have reviewed the triage vital signs and the nursing notes.   HISTORY  Chief Complaint Abdominal Pain   HPI Kelsey Woods is a 67 y.o. female who presents for evaluation of abdominal pain.Patient reports that her abdominal pain started 3 weeks ago initially generalized. Thought to be a side effect of NSAIDs that she was placed on for arthritis by PCP. After discontinuing the NSAIDs the pain improved. About 2 weeks ago she started having pain on the right side of her abdomen which is sharp, constant, initially on the right upper quadrant but recently migrated down to the right lower quadrant. She reports 2 weeks of fever as high as 102F. She has had nausea but no vomiting. No dysuria or hematuria. Patient denies ever having similar pain. She reports the pain now radiates to her back.  Past Medical History:  Diagnosis Date  . Arthritis     There are no active problems to display for this patient.   Past Surgical History:  Procedure Laterality Date  . BREAST BIOPSY Right 1980's   Benign  . CESAREAN SECTION    . CHOLECYSTECTOMY    . REPLACEMENT TOTAL KNEE BILATERAL    . TOTAL HIP ARTHROPLASTY      Prior to Admission medications   Medication Sig Start Date End Date Taking? Authorizing Provider  esomeprazole (NEXIUM) 20 MG capsule Take 20 mg by mouth daily.    [provider]  meloxicam (MOBIC) 7.5 MG tablet Take 7.5 mg by mouth 2 (two) times daily.    [provider]  predniSONE (DELTASONE) 50 MG tablet Take 1 tablet (50 mg total) by mouth daily with breakfast. 07/13/16   Lavonia Drafts, MD    Allergies Aleve [naproxen sodium]  No family history on file.  Social History Social History   Tobacco Use  . Smoking status: Never Smoker  . Smokeless tobacco: Never Used  Substance Use Topics  .  Alcohol use: Yes    Comment: weekends  . Drug use: No    Review of Systems  Constitutional: Negative for fever. Eyes: Negative for visual changes. ENT: Negative for sore throat. Neck: No neck pain  Cardiovascular: Negative for chest pain. Respiratory: Negative for shortness of breath. Gastrointestinal: + R sided abdominal pain and nausea. No vomiting or diarrhea. Genitourinary: Negative for dysuria. Musculoskeletal: Negative for back pain. Skin: Negative for rash. Neurological: Negative for headaches, weakness or numbness. Psych: No SI or HI  ____________________________________________   PHYSICAL EXAM:  VITAL SIGNS: ED Triage Vitals  Enc Vitals Group     BP 11/28/17 1432 119/66     Pulse Rate 11/28/17 1432 85     Resp 11/28/17 1432 16     Temp 11/28/17 1432 99.2 F (37.3 C)     Temp Source 11/28/17 1432 Oral     SpO2 11/28/17 1432 97 %     Weight 11/28/17 1432 270 lb (122.5 kg)     Height 11/28/17 1432 5\' 7"  (1.702 m)     Head Circumference --      Peak Flow --      Pain Score 11/28/17 1436 4     Pain Loc --      Pain Edu? --      Excl. in [place]? --     Constitutional: Alert and oriented. Well appearing and in no apparent distress. HEENT:      Head: Normocephalic  and atraumatic.         Eyes: Conjunctivae are normal. Sclera is non-icteric.       Mouth/Throat: Mucous membranes are moist.       Neck: Supple with no signs of meningismus. Cardiovascular: Regular rate and rhythm. No murmurs, gallops, or rubs. 2+ symmetrical distal pulses are present in all extremities. No JVD. Respiratory: Normal respiratory effort. Lungs are clear to auscultation bilaterally. No wheezes, crackles, or rhonchi.  Gastrointestinal: Obese, non tender, and non distended with positive bowel sounds. No rebound or guarding. Musculoskeletal: Nontender with normal range of motion in all extremities. No edema, cyanosis, or erythema of extremities. Neurologic: Normal speech and language. Face is  symmetric. Moving all extremities. No gross focal neurologic deficits are appreciated. Skin: Skin is warm, dry and intact. No rash noted. Psychiatric: Mood and affect are normal. Speech and behavior are normal.  ____________________________________________   LABS (all labs ordered are listed, but only abnormal results are displayed)  Labs Reviewed  CBC - Abnormal; Notable for the following components:      Result Value   WBC 11.7 (*)    Hemoglobin 11.9 (*)    All other components within normal limits  URINALYSIS, COMPLETE (UACMP) WITH MICROSCOPIC - Abnormal; Notable for the following components:   Color, Urine AMBER (*)    APPearance CLOUDY (*)    Specific Gravity, Urine 1.034 (*)    Bilirubin Urine SMALL (*)    Protein, ur 30 (*)    Bacteria, UA RARE (*)    Squamous Epithelial / LPF 6-30 (*)    All other components within normal limits  COMPREHENSIVE METABOLIC PANEL - Abnormal; Notable for the following components:   Sodium 133 (*)    Chloride 97 (*)    Glucose, Bld 107 (*)    Calcium 8.0 (*)    Albumin 2.7 (*)    ALT 10 (*)    All other components within normal limits  LIPASE, BLOOD   ____________________________________________  EKG  none  ____________________________________________  RADIOLOGY  CT a/p:  6.1 cm right lower quadrant retroperitoneal abscess. The presence of a focal calcification suggests this may be secondary to ruptured appendicitis, containing appendicolith. The appendix is not discretely identified. This is likely approachable for percutaneous drainage, if needed. ____________________________________________   PROCEDURES  Procedure(s) performed: None Procedures Critical Care performed: yes  CRITICAL CARE Performed by: Rudene Re  ?  Total critical care time: 35 min  Critical care time was exclusive of separately billable procedures and treating other patients.  Critical care was necessary to treat or prevent imminent or  life-threatening deterioration.  Critical care was time spent personally by me on the following activities: development of treatment plan with patient and/or surrogate as well as nursing, discussions with consultants, evaluation of patient's response to treatment, examination of patient, obtaining history from patient or surrogate, ordering and performing treatments and interventions, ordering and review of laboratory studies, ordering and review of radiographic studies, pulse oximetry and re-evaluation of patient's condition.  ____________________________________________   INITIAL IMPRESSION / ASSESSMENT AND PLAN / ED COURSE  67 y.o. female who presents for evaluation of RLQ abdominal pain x 2 weeks associated with fever 102F daily and nausea. Patient is well-appearing, has a low-grade fever of 99.2, hemodynamically stable, abdomen obese with no tenderness on exam. Labs showing mild leukocytosis with white count of 11.7. Urinalysis with no evidence of infection. differential diagnoses including appendicitis versus diverticulitis versus colitis versus kidney stone. CT consistent with a ruptured appendix with  a 6.1 cm retroperitoneal abscess. Patient was started on Zosyn. Discussed with Dr. Adonis Huguenin for surgical management.       As part of my medical decision making, I reviewed the following data within the [facility] notes reviewed and incorporated, Labs reviewed , Old chart reviewed, Radiograph reviewed , Discussed with admitting physician , Notes from prior ED visits and [place] Controlled Substance Database    Pertinent labs & imaging results that were available during my care of the patient were reviewed by me and considered in my medical decision making (see chart for details).    ____________________________________________   FINAL CLINICAL IMPRESSION(S) / ED DIAGNOSES  Final diagnoses:  Acute appendicitis with perforation and peritoneal abscess      NEW  MEDICATIONS STARTED DURING THIS VISIT:  ED Discharge Orders    None       Note:  This document was prepared using Dragon voice recognition software and may include unintentional dictation errors.    Alfred Levins, [place], MD 11/28/17 2008

## 2017-11-28 NOTE — H&P (Signed)
Patient ID: Kelsey Woods, female   DOB: May 13, 1951, 67 y.o.   MRN: [id number]  CC: Abdominal pain  HPI Kelsey Woods is a 67 y.o. female who presents the emergency room for further workup of 3 weeks of abdominal pain and fevers and chills.  Patient reports that it started 67 pain abdominal pain and gradually localized to the right lower quadrant.  Her history was complicated by being started on new medication by her primary care provider in December which was known to cause abdominal discomfort.  She was treated by having the medication stopped approximately 10 days ago that was followed by some relief.  That relief lasted for only approximately 2 days and then the pain returned and gradually worsened prompting her to present for evaluation today.  She was referred to the ER by her primary care provider due to the length of symptoms, fevers, right lower quadrant abdominal pain and fear of appendicitis.  She was found to have a ruptured appendicitis on presentation.  Patient reports she has had significant nausea and fevers but denies any chest pain, shortness of breath, vomiting, diarrhea, constipation.  HPI  Past Medical History:  Diagnosis Date  . Arthritis     Past Surgical History:  Procedure Laterality Date  . BREAST BIOPSY Right 1980's   Benign  . CESAREAN SECTION    . CHOLECYSTECTOMY    . REPLACEMENT TOTAL KNEE BILATERAL    . TOTAL HIP ARTHROPLASTY      No family history on file.  Patient denies any known family history of cancer, diabetes, heart disease  Social History Social History   Tobacco Use  . Smoking status: Never Smoker  . Smokeless tobacco: Never Used  Substance Use Topics  . Alcohol use: Yes    Comment: weekends  . Drug use: No    Allergies  Allergen Reactions  . Aleve [Naproxen Sodium]     Current Facility-Administered Medications  Medication Dose Route Frequency Provider Last Rate Last Dose  . piperacillin-tazobactam (ZOSYN) IVPB 3.375 g   3.375 g Intravenous Once Alfred Levins, [place], MD 100 mL/hr at 11/28/17 2005 3.375 g at 11/28/17 2005   Current Outpatient Medications  Medication Sig Dispense Refill  . aspirin EC 81 MG tablet Take 81 mg by mouth daily.    . cetirizine (ZYRTEC) 10 MG tablet Take 10 mg by mouth daily.    . meloxicam (MOBIC) 7.5 MG tablet Take 7.5 mg by mouth daily.     . predniSONE (DELTASONE) 50 MG tablet Take 1 tablet (50 mg total) by mouth daily with breakfast. (Patient not taking: Reported on 11/28/2017) 3 tablet 0     Review of Systems A multi-point review of systems was asked and was negative except for the findings documented in the HPI  Physical Exam Blood pressure 114/64, pulse 75, temperature 99.6 F (37.6 C), temperature source Oral, resp. rate 16, height 5\' 7"  (1.702 m), weight 122.5 kg (270 lb), SpO2 100 %. CONSTITUTIONAL: Resting in no acute distress. EYES: Pupils are equal, round, and reactive to light, Sclera are non-icteric. EARS, NOSE, MOUTH AND THROAT: The oropharynx is clear. The oral mucosa is pink and moist. Hearing is intact to voice. LYMPH NODES:  Lymph nodes in the neck are normal. RESPIRATORY:  Lungs are clear. There is normal respiratory effort, with equal breath sounds bilaterally, and without pathologic use of accessory muscles. CARDIOVASCULAR: Heart is regular without murmurs, gallops, or rubs. GI: The abdomen is obese, soft, mildly tender to deep palpation in the  right lower quadrant but without rebound or guarding, and nondistended. There are no palpable masses. There is no hepatosplenomegaly. There are normal bowel sounds in all quadrants. GU: Rectal deferred.   MUSCULOSKELETAL: Normal muscle strength and tone. No cyanosis or edema.   SKIN: Turgor is good and there are no pathologic skin lesions or ulcers. NEUROLOGIC: Motor and sensation is grossly normal. Cranial nerves are grossly intact. PSYCH:  Oriented to person, place and time. Affect is normal.  Data Reviewed Images  and labs reviewed which showed a white count of 11.7, multiple electrolyte abnormality including a sodium of 133, chloride of 97, glucose 107, albumin of 2.7, ALT of 10.  The remainder of her labs are within normal limits.  CT scan of the abdomen shows a 6 cm abscess with a well-formed wall in the right lower quadrant with a fecalith within the middle of the abscess. I have personally reviewed the patient's imaging, laboratory findings and medical records.    Assessment    Ruptured appendicitis with abscess    Plan    67 year old female with a ruptured appendicitis and abscess.  Discussed with the patient and her husband about the diagnosis and the treatment options and plans.  Discussed the recommendation is for admission, IV fluids, IV antibiotics, percutaneous drainage of the abscess.  Discussed that this should allow for clearing the infection prior to surgical intervention.  If this fails to improve her infection she may require an urgent operation with a higher risk of damage or removal of additional sections of bowel other than the appendix.  They voiced understanding and agree with this plan.     Time spent with the patient was 50 minutes, with more than 50% of the time spent in face-to-face education, counseling and care coordination.     Clayburn Pert, MD FACS General Surgeon 11/28/2017, 8:33 PM

## 2017-11-28 NOTE — ED Notes (Signed)
ED Provider at bedside. 

## 2017-11-29 ENCOUNTER — Other Ambulatory Visit: Payer: Self-pay

## 2017-11-29 ENCOUNTER — Inpatient Hospital Stay: Payer: Medicare Other

## 2017-11-29 DIAGNOSIS — K3533 Acute appendicitis with perforation and localized peritonitis, with abscess: Principal | ICD-10-CM

## 2017-11-29 LAB — COMPREHENSIVE METABOLIC PANEL
ALBUMIN: 2.6 g/dL — AB (ref 3.5–5.0)
ALT: 10 U/L — ABNORMAL LOW (ref 14–54)
AST: 22 U/L (ref 15–41)
Alkaline Phosphatase: 79 U/L (ref 38–126)
Anion gap: 9 (ref 5–15)
BILIRUBIN TOTAL: 0.9 mg/dL (ref 0.3–1.2)
BUN: 7 mg/dL (ref 6–20)
CHLORIDE: 100 mmol/L — AB (ref 101–111)
CO2: 26 mmol/L (ref 22–32)
Calcium: 7.9 mg/dL — ABNORMAL LOW (ref 8.9–10.3)
Creatinine, Ser: 0.73 mg/dL (ref 0.44–1.00)
GFR calc Af Amer: >60 mL/min (ref >60)
GFR calc non Af Amer: >60 mL/min (ref >60)
GLUCOSE: 92 mg/dL (ref 65–99)
POTASSIUM: 3.4 mmol/L — AB (ref 3.5–5.1)
Sodium: 135 mmol/L (ref 135–145)
TOTAL PROTEIN: 6.6 g/dL (ref 6.5–8.1)

## 2017-11-29 LAB — PROTIME-INR
INR: 1.26
Prothrombin Time: 15.7 seconds — ABNORMAL HIGH (ref 11.4–15.2)

## 2017-11-29 LAB — APTT: aPTT: 36 seconds (ref 24–36)

## 2017-11-29 LAB — CBC
HEMATOCRIT: 34 % — AB (ref 35.0–47.0)
HEMOGLOBIN: 11.3 g/dL — AB (ref 12.0–16.0)
MCH: 31.5 pg (ref 26.0–34.0)
MCHC: 33.2 g/dL (ref 32.0–36.0)
MCV: 94.7 fL (ref 80.0–100.0)
Platelets: 317 10*3/uL (ref 150–440)
RBC: 3.59 MIL/uL — ABNORMAL LOW (ref 3.80–5.20)
RDW: 13.2 % (ref 11.5–14.5)
WBC: 11.2 10*3/uL — ABNORMAL HIGH (ref 3.6–11.0)

## 2017-11-29 LAB — PHOSPHORUS: Phosphorus: 3.4 mg/dL (ref 2.5–4.6)

## 2017-11-29 LAB — MAGNESIUM: Magnesium: 2.1 mg/dL (ref 1.7–2.4)

## 2017-11-29 MED ORDER — MIDAZOLAM HCL 2 MG/2ML IJ SOLN
INTRAMUSCULAR | Status: AC | PRN
  Administered 2017-11-29 (×2): 1 mg via INTRAVENOUS

## 2017-11-29 MED ORDER — LIDOCAINE HCL 1 % IJ SOLN
INTRAMUSCULAR | Status: AC | PRN
Start: 2017-11-29 — End: 2017-11-29
  Administered 2017-11-29: 5 mL via INTRADERMAL

## 2017-11-29 MED ORDER — FENTANYL CITRATE (PF) 100 MCG/2ML IJ SOLN
INTRAMUSCULAR | Status: AC | PRN
  Administered 2017-11-29 (×2): 50 ug via INTRAVENOUS

## 2017-11-29 MED ORDER — MIDAZOLAM HCL 2 MG/2ML IJ SOLN
INTRAMUSCULAR | Status: AC
  Filled 2017-11-29: qty 4

## 2017-11-29 MED ORDER — ACETAMINOPHEN 325 MG PO TABS
650.0000 mg | ORAL_TABLET | Freq: Four times a day (QID) | ORAL | Status: DC | PRN
  Administered 2017-11-29 – 2017-11-30 (×2): 650 mg via ORAL
  Filled 2017-11-29 (×2): qty 2

## 2017-11-29 MED ORDER — SODIUM CHLORIDE 0.9% FLUSH
5.0000 mL | Freq: Three times a day (TID) | INTRAVENOUS | Status: DC
  Administered 2017-11-29 – 2017-12-02 (×9): 5 mL via INTRAVENOUS

## 2017-11-29 MED ORDER — FENTANYL CITRATE (PF) 100 MCG/2ML IJ SOLN
INTRAMUSCULAR | Status: AC
  Filled 2017-11-29: qty 4

## 2017-11-29 NOTE — Progress Notes (Signed)
Initial Nutrition Assessment  DOCUMENTATION CODES:   Morbid obesity  INTERVENTION:   - Encourage PO intake once diet is advanced - Consider ordering oral nutrition supplement once diet is advanced if pt is agreeable to receiving a supplement that contains whey protein  NUTRITION DIAGNOSIS:   Inadequate oral intake related to poor appetite, nausea as evidenced by per patient/family report.  GOAL:   Patient will meet greater than or equal to 90% of their needs  MONITOR:   Diet advancement, Weight trends  REASON FOR ASSESSMENT:   Consult, Malnutrition Screening Tool Assessment of nutrition requirement/status  ASSESSMENT:   67 yo female who presented to ED with abdominal pain, fever, and nausea. PMH significant only for arthritis and cholecystectomy. CT scan of the abdomen showed retroperitoneal abscess and suspected perforated appendix. Plan for abscess drain.  Spoke with pt and family members at bedside. Pt reports nausea and poor appetite over the past 2-3 weeks. Pt states that due to nausea, she could only eat 1-2 meals that might include a scrambled egg or bowl of soup. Prior to these past 2-3 weeks, pt reports having a good appetite and eating 3 meals and 0-1 snacks daily. Pt states she does not eat red meat, dairy products, or products containing whey. 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 Pt states she does not consume dairy products due to gastrointestinal distress after eating that includes stomachaches and diarrhea. Pt denies having these symptoms prior to her alpha-gal allergy diagnosis. Pt would like to ensure that she is not provided with foods containing these items.  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 Pt's weight listed as 190 lbs in September 2017. No charted weights since that time.  Pt states that she might like to have an oral  nutrition supplement once her diet is advanced but does not want supplements that contain whey or milk proteins. All currently available supplements contain these ingredients.  Medications reviewed and include: Protonix  Labs reviewed: potassium 3.4 (L), calcium 7.0 (L)  NUTRITION - FOCUSED PHYSICAL EXAM:    Most Recent Value  Orbital Region  No depletion  Upper Arm Region  No depletion  Thoracic and Lumbar Region  No depletion  Buccal Region  No depletion  Temple Region  No depletion  Clavicle Bone Region  No depletion  Clavicle and Acromion Bone Region  No depletion  Scapular Bone Region  Unable to assess  Dorsal Hand  No depletion  Patellar Region  No depletion  Anterior Thigh Region  No depletion  Posterior Calf Region  No depletion  Edema (RD Assessment)  None  Hair  Reviewed  Eyes  Reviewed  Mouth  Reviewed  Skin  Reviewed  Nails  Reviewed       Diet Order:  Diet NPO time specified  EDUCATION NEEDS:   Education needs have been addressed  Skin:  Skin Assessment: Reviewed RN Assessment  Last BM:  11/28/17  Height:   Ht Readings from Last 1 Encounters:  11/28/17 5\' 7"  (1.702 m)    Weight:   Wt Readings from Last 1 Encounters:  11/28/17 270 lb 3.2 oz (122.6 kg)    Ideal Body Weight:  61.4 kg  BMI:  Body mass index is 42.32 kg/m.  Estimated Nutritional Needs:   Kcal:  2150-2350 kcal/day  Protein:  140-150 grams/day  Fluid:  >2.0 L/day  Kate Jablonski Giordan [place], MS, RD, LDN Pager: [phone] Weekend/After Hours: [phone]  

## 2017-11-29 NOTE — Procedures (Signed)
appy abscess  S/p CT drain 40cc pus aspirated No comp Stable cx sent Full report in pacs

## 2017-11-29 NOTE — Progress Notes (Signed)
11/29/2017  Subjective: Patient admitted overnight with ruptured appendicitis with abscess.  This morning with some right sided pain and low grade temp.  Vital signs: Temp:  [98.2 F (36.8 C)-101.5 F (38.6 C)] 99.2 F (37.3 C) (01/22 1644) Pulse Rate:  [61-78] 66 (01/22 1644) Resp:  [14-23] 18 (01/22 1644) BP: (91-123)/(49-75) 110/49 (01/22 1644) SpO2:  [93 %-100 %] 96 % (01/22 1644) Weight:  [122.6 kg (270 lb 3.2 oz)] 122.6 kg (270 lb 3.2 oz) (01/21 2200)   Intake/Output: 01/21 0701 - 01/22 0700 In: 3295 [I.V.:1000; IV Piggyback:90] Out: 700 [Urine:700] Last BM Date: 11/28/17  Physical Exam: Constitutional: No acute distress Abdomen:  Soft, nondistended, with tenderness to palpation in the right lower quadrant and right flank.  Labs:  Recent Labs    11/28/17 1438 11/29/17 0317  WBC 11.7* 11.2*  HGB 11.9* 11.3*  HCT 36.4 34.0*  PLT 329 317   Recent Labs    11/28/17 1820 11/29/17 0317  NA 133* 135  K 3.5 3.4*  CL 97* 100*  CO2 27 26  GLUCOSE 107* 92  BUN 9 7  CREATININE 0.72 0.73  CALCIUM 8.0* 7.9*   Recent Labs    11/29/17 0317  LABPROT 15.7*  INR 1.26    Imaging: Ct Abdomen Pelvis W Contrast  Result Date: 11/28/2017 CLINICAL DATA:  R lower abdominal pain x 3 weeks. Began fevers 2 weeks ago. Hx of chole and c-section EXAM: CT ABDOMEN AND PELVIS WITH CONTRAST TECHNIQUE: Multidetector CT imaging of the abdomen and pelvis was performed using the standard protocol following bolus administration of intravenous contrast. CONTRAST:  134mL ISOVUE-300 IOPAMIDOL (ISOVUE-300) INJECTION 61% COMPARISON:  None. FINDINGS: Lower chest: No acute abnormality. Hepatobiliary: No focal liver abnormality is seen. Status post cholecystectomy. No biliary dilatation. Pancreas: Unremarkable. No pancreatic ductal dilatation or surrounding inflammatory changes. Spleen: Normal in size without focal abnormality. Adrenals/Urinary Tract: Adrenal glands are unremarkable. Kidneys are normal,  without renal calculi, focal lesion, or hydronephrosis. Bladder is unremarkable. Stomach/Bowel: Stomach is nondistended.  Small bowel decompressed. The appendix is not discretely identified. There is a right lower quadrant retroperitoneal multiloculated fluid collection measuring 6.1 x 5.2 cm containing a 1.1 cm focal calcification. The collection is abuts the iliopsoas complex. There are moderate surrounding inflammatory/edematous changes. The process abuts the terminal ileum. The colon is nondilated, unremarkable. Vascular/Lymphatic: Minimal scattered aortoiliac arterial plaque without aneurysm or stenosis. No adenopathy localized. Reproductive: Uterus and bilateral adnexa are unremarkable. Other: No ascites.  No free air. Musculoskeletal: Right iliac fossa abscess as above. Spondylitic changes throughout the lumbar spine. No fracture or worrisome bone lesion. Right hip arthroplasty hardware partially visualized. IMPRESSION: 1. 6.1 cm right lower quadrant retroperitoneal abscess. The presence of a focal calcification suggests this may be secondary to ruptured appendicitis, containing appendicolith. The appendix is not discretely identified. This is likely approachable for percutaneous drainage, if needed. Electronically Signed   By: Lucrezia Europe M.D.   On: 11/28/2017 19:50   Ct Image Guided Drainage By Percutaneous Catheter  Result Date: 11/29/2017 INDICATION: Ruptured appendicitis, right lower quadrant abscess EXAM: CT DRAINAGE RIGHT LOWER QUADRANT APPENDICITIS ABSCESS MEDICATIONS: The patient is currently admitted to the hospital and receiving intravenous antibiotics. The antibiotics were administered within an appropriate time frame prior to the initiation of the procedure. ANESTHESIA/SEDATION: Fentanyl 100 mcg IV; Versed 2.0 mg IV Moderate Sedation Time:  30 MINUTES The patient was continuously monitored during the procedure by the interventional radiology nurse under my direct supervision. COMPLICATIONS:  None immediate. PROCEDURE:  Informed written consent was obtained from the patient after a thorough discussion of the procedural risks, benefits and alternatives. All questions were addressed. Maximal Sterile Barrier Technique was utilized including caps, mask, sterile gowns, sterile gloves, sterile drape, hand hygiene and skin antiseptic. A timeout was performed prior to the initiation of the procedure. Previous imaging reviewed. Patient positioned supine. Noncontrast localization CT performed. The right lower quadrant appendiceal abscess was localized. Overlying skin marked for an anterolateral approach. Under sterile conditions and local anesthesia, an 18 gauge 15 cm access needle was advanced from a lateral oblique approach to the abscess. Needle position confirmed with CT. Syringe aspiration yielded purulent fluid. Sample sent for culture. Guidewire inserted followed by dilatation to insert a 10 [place] drain. Drain catheter retention loop formed in the abscess cavity and confirmed with CT. Syringe aspiration yielded 40 cc total fluid. Catheter secured with a Prolene suture and connected to external suction bulb. Sterile dressing applied. No immediate complication. Patient tolerated the procedure well. IMPRESSION: Successful CT-guided right lower quadrant appendiceal abscess drain insertion. Electronically Signed   By: Jerilynn Mages.  Shick M.D.   On: 11/29/2017 15:15    Assessment/Plan: 67 yo female with ruptured appendicitis with abscess  --discussed with IR for percutaneous drain placement. --continue NPO with IV fluid hydration and pain control --tylenol for temp.   Kelsey Woods, [place]

## 2017-11-29 NOTE — Consult Note (Signed)
Chief Complaint: Patient was seen in consultation today for intra-abdominal fluid collection  Referring Physician(s): Dr. Adonis Huguenin  History of Present Illness: Kelsey Woods is a 67 y.o. female with past medical history of significant for cholecystectomy and arthritis presented to [facility] with complaint of abdominal pain.  Patient is found to have suspected perforated appendix.  She has had intermittent fevers and elevated WBC of 11.2.  CT Abdomen 11/28/16: 1. 6.1 cm right lower quadrant retroperitoneal abscess. The presence of a focal calcification suggests this may be secondary to ruptured appendicitis, containing appendicolith. The appendix is not discretely identified. This is likely approachable for percutaneous drainage, if needed.  IR consulted for intra-abdominal fluid collection aspiration and drainage. Case reviewed and approved by Dr. Annamaria Boots.  Patient has been NPO.  She does not take blood thinners.   Past Medical History:  Diagnosis Date  . Arthritis     Past Surgical History:  Procedure Laterality Date  . BREAST BIOPSY Right 1980's   Benign  . CESAREAN SECTION    . CHOLECYSTECTOMY    . REPLACEMENT TOTAL KNEE BILATERAL    . TOTAL HIP ARTHROPLASTY      Allergies: Aleve [naproxen sodium]  Medications: Prior to Admission medications   Medication Sig Start Date End Date Taking? Authorizing Provider  aspirin EC 81 MG tablet Take 81 mg by mouth daily.   Yes [provider]  cetirizine (ZYRTEC) 10 MG tablet Take 10 mg by mouth daily.   Yes [provider]  meloxicam (MOBIC) 7.5 MG tablet Take 7.5 mg by mouth daily.    Yes [provider]  predniSONE (DELTASONE) 50 MG tablet Take 1 tablet (50 mg total) by mouth daily with breakfast. Patient not taking: Reported on 11/28/2017 07/13/16   Lavonia Drafts, MD     No family history on file.  Social History   Socioeconomic History  . Marital status: Married    Spouse name: None  .  Number of children: None  . Years of education: None  . Highest education level: None  Social Needs  . Financial resource strain: None  . Food insecurity - worry: None  . Food insecurity - inability: None  . Transportation needs - medical: None  . Transportation needs - non-medical: None  Occupational History  . None  Tobacco Use  . Smoking status: Never Smoker  . Smokeless tobacco: Never Used  Substance and Sexual Activity  . Alcohol use: Yes    Comment: weekends  . Drug use: No  . Sexual activity: Yes  Other Topics Concern  . None  Social History Narrative  . None    Review of Systems  Constitutional: Positive for fever. Negative for fatigue.  Respiratory: Negative for cough and shortness of breath.   Cardiovascular: Negative for chest pain.  Gastrointestinal: Positive for abdominal pain.  Psychiatric/Behavioral: Negative for behavioral problems and confusion.    Vital Signs: BP (!) 96/50 (BP Location: Left Arm)   Pulse 76   Temp 98.2 F (36.8 C) (Oral)   Resp 14   Ht 5\' 7"  (1.702 m)   Wt 270 lb 3.2 oz (122.6 kg)   SpO2 93%   BMI 42.32 kg/m   Physical Exam  Constitutional: She is oriented to person, place, and time. She appears well-developed.  Cardiovascular: Normal rate and regular rhythm.  Pulmonary/Chest: Effort normal and breath sounds normal. No respiratory distress.  Abdominal: Normal appearance. There is tenderness.  Neurological: She is alert and oriented to person, place, and  time.  Skin: Skin is warm and dry.  Psychiatric: She has a normal mood and affect. Her behavior is normal.  Nursing note and vitals reviewed.   Mallampati Score:  MD Evaluation Airway: WNL Heart: WNL Abdomen: WNL Chest/ Lungs: WNL ASA  Classification: 3 Mallampati/Airway Score: One  Imaging: Ct Abdomen Pelvis W Contrast  Result Date: 11/28/2017 CLINICAL DATA:  R lower abdominal pain x 3 weeks. Began fevers 2 weeks ago. Hx of chole and c-section EXAM: CT ABDOMEN AND  PELVIS WITH CONTRAST TECHNIQUE: Multidetector CT imaging of the abdomen and pelvis was performed using the standard protocol following bolus administration of intravenous contrast. CONTRAST:  1102mL ISOVUE-300 IOPAMIDOL (ISOVUE-300) INJECTION 61% COMPARISON:  None. FINDINGS: Lower chest: No acute abnormality. Hepatobiliary: No focal liver abnormality is seen. Status post cholecystectomy. No biliary dilatation. Pancreas: Unremarkable. No pancreatic ductal dilatation or surrounding inflammatory changes. Spleen: Normal in size without focal abnormality. Adrenals/Urinary Tract: Adrenal glands are unremarkable. Kidneys are normal, without renal calculi, focal lesion, or hydronephrosis. Bladder is unremarkable. Stomach/Bowel: Stomach is nondistended.  Small bowel decompressed. The appendix is not discretely identified. There is a right lower quadrant retroperitoneal multiloculated fluid collection measuring 6.1 x 5.2 cm containing a 1.1 cm focal calcification. The collection is abuts the iliopsoas complex. There are moderate surrounding inflammatory/edematous changes. The process abuts the terminal ileum. The colon is nondilated, unremarkable. Vascular/Lymphatic: Minimal scattered aortoiliac arterial plaque without aneurysm or stenosis. No adenopathy localized. Reproductive: Uterus and bilateral adnexa are unremarkable. Other: No ascites.  No free air. Musculoskeletal: Right iliac fossa abscess as above. Spondylitic changes throughout the lumbar spine. No fracture or worrisome bone lesion. Right hip arthroplasty hardware partially visualized. IMPRESSION: 1. 6.1 cm right lower quadrant retroperitoneal abscess. The presence of a focal calcification suggests this may be secondary to ruptured appendicitis, containing appendicolith. The appendix is not discretely identified. This is likely approachable for percutaneous drainage, if needed. Electronically Signed   By: Lucrezia Europe M.D.   On: 11/28/2017 19:50     Labs:  CBC: Recent Labs    11/28/17 1438 11/29/17 0317  WBC 11.7* 11.2*  HGB 11.9* 11.3*  HCT 36.4 34.0*  PLT 329 317    COAGS: Recent Labs    11/29/17 0317  INR 1.26  APTT 36    BMP: Recent Labs    11/28/17 1820 11/29/17 0317  NA 133* 135  K 3.5 3.4*  CL 97* 100*  CO2 27 26  GLUCOSE 107* 92  BUN 9 7  CALCIUM 8.0* 7.9*  CREATININE 0.72 0.73  GFRNONAA >60 >60  GFRAA >60 >60    LIVER FUNCTION TESTS: Recent Labs    11/28/17 1820 11/29/17 0317  BILITOT 0.7 0.9  AST 19 22  ALT 10* 10*  ALKPHOS 78 79  PROT 7.0 6.6  ALBUMIN 2.7* 2.6*    TUMOR MARKERS: No results for input(s): AFPTM, CEA, CA199, CHROMGRNA in the last 8760 hours.  Assessment and Plan: Intra-abdominal fluid collection, suspected perforated appendix Patient admitted with abdominal pain.  Found to have a retroperitoneal abscess.   IR consulted for aspiration and drainage at the request of Dr. Adonis Huguenin.  Case reviewed and approved by Dr. Annamaria Boots.  Patient has been NPO.  She does not take blood thinners.  Risks and benefits discussed with the patient including bleeding, infection, damage to adjacent structures, bowel perforation/fistula connection, and sepsis. All of the patient's questions were answered, patient is agreeable to proceed. Consent signed and in chart.  Thank you for this interesting consult.  I greatly enjoyed meeting CANDIACE WEST and look forward to participating in their care.  A copy of this report was sent to the requesting provider on this date.  Electronically Signed: Docia Barrier 11/29/2017, 10:20 AM   I spent a total of 40 Minutes    in face to face in clinical consultation, greater than 50% of which was counseling/coordinating care for intra-abdominal fluid collection.

## 2017-11-29 NOTE — Sedation Documentation (Signed)
REPORT CALLED TO FLOOR NURSE Ander Purpura

## 2017-11-30 LAB — BASIC METABOLIC PANEL
Anion gap: 8 (ref 5–15)
BUN: 10 mg/dL (ref 6–20)
CHLORIDE: 100 mmol/L — AB (ref 101–111)
CO2: 27 mmol/L (ref 22–32)
CREATININE: 0.85 mg/dL (ref 0.44–1.00)
Calcium: 7.6 mg/dL — ABNORMAL LOW (ref 8.9–10.3)
GFR calc non Af Amer: >60 mL/min (ref >60)
GLUCOSE: 82 mg/dL (ref 65–99)
Potassium: 3.5 mmol/L (ref 3.5–5.1)
Sodium: 135 mmol/L (ref 135–145)

## 2017-11-30 LAB — CBC WITH DIFFERENTIAL/PLATELET
Basophils Absolute: 0 10*3/uL (ref 0–0.1)
Basophils Relative: 0 %
EOS ABS: 0.2 10*3/uL (ref 0–0.7)
Eosinophils Relative: 2 %
HEMATOCRIT: 30 % — AB (ref 35.0–47.0)
HEMOGLOBIN: 9.9 g/dL — AB (ref 12.0–16.0)
Lymphocytes Relative: 14 %
Lymphs Abs: 1.4 10*3/uL (ref 1.0–3.6)
MCH: 31.4 pg (ref 26.0–34.0)
MCHC: 33 g/dL (ref 32.0–36.0)
MCV: 95.1 fL (ref 80.0–100.0)
MONOS PCT: 5 %
Monocytes Absolute: 0.5 10*3/uL (ref 0.2–0.9)
NEUTROS ABS: 7.5 10*3/uL — AB (ref 1.4–6.5)
NEUTROS PCT: 79 %
Platelets: 322 10*3/uL (ref 150–440)
RBC: 3.15 MIL/uL — ABNORMAL LOW (ref 3.80–5.20)
RDW: 13.5 % (ref 11.5–14.5)
WBC: 9.5 10*3/uL (ref 3.6–11.0)

## 2017-11-30 LAB — MAGNESIUM: Magnesium: 2.1 mg/dL (ref 1.7–2.4)

## 2017-11-30 NOTE — Progress Notes (Signed)
11/30/2017  Subjective: No acute events overnight.  Had percutaneous drain placed yesterday.  Some sweats this morning but she had had some low grade temps.  Denies any further nausea and pain has improved.  Vital signs: Temp:  [98.4 F (36.9 C)-99.4 F (37.4 C)] 98.4 F (36.9 C) (01/23 0606) Pulse Rate:  [61-74] 74 (01/23 0436) Resp:  [16-23] 16 (01/23 0436) BP: (91-120)/(48-75) 109/52 (01/23 0436) SpO2:  [93 %-100 %] 93 % (01/23 0436)   Intake/Output: 01/22 0701 - 01/23 0700 In: 2977 [I.V.:2776; IV Piggyback:186] Out: 995 [Urine:875; Drains:120] Last BM Date: (prior to admission)  Physical Exam: Constitutional: No acute distress Abdomen:  Soft, obese, nondistended, with focal tenderness to palpation over the drain insertion site in right lower quadrant.  The RLQ otherwise does not have much pain on palpation.  Drain in place with seropurulent fluid in bulb.  Labs:  Recent Labs    11/29/17 0317 11/30/17 0252  WBC 11.2* 9.5  HGB 11.3* 9.9*  HCT 34.0* 30.0*  PLT 317 322   Recent Labs    11/29/17 0317 11/30/17 0252  NA 135 135  K 3.4* 3.5  CL 100* 100*  CO2 26 27  GLUCOSE 92 82  BUN 7 10  CREATININE 0.73 0.85  CALCIUM 7.9* 7.6*   Recent Labs    11/29/17 0317  LABPROT 15.7*  INR 1.26    Imaging: Ct Image Guided Drainage By Percutaneous Catheter  Result Date: 11/29/2017 INDICATION: Ruptured appendicitis, right lower quadrant abscess EXAM: CT DRAINAGE RIGHT LOWER QUADRANT APPENDICITIS ABSCESS MEDICATIONS: The patient is currently admitted to the hospital and receiving intravenous antibiotics. The antibiotics were administered within an appropriate time frame prior to the initiation of the procedure. ANESTHESIA/SEDATION: Fentanyl 100 mcg IV; Versed 2.0 mg IV Moderate Sedation Time:  30 MINUTES The patient was continuously monitored during the procedure by the interventional radiology nurse under my direct supervision. COMPLICATIONS: None immediate. PROCEDURE:  Informed written consent was obtained from the patient after a thorough discussion of the procedural risks, benefits and alternatives. All questions were addressed. Maximal Sterile Barrier Technique was utilized including caps, mask, sterile gowns, sterile gloves, sterile drape, hand hygiene and skin antiseptic. A timeout was performed prior to the initiation of the procedure. Previous imaging reviewed. Patient positioned supine. Noncontrast localization CT performed. The right lower quadrant appendiceal abscess was localized. Overlying skin marked for an anterolateral approach. Under sterile conditions and local anesthesia, an 18 gauge 15 cm access needle was advanced from a lateral oblique approach to the abscess. Needle position confirmed with CT. Syringe aspiration yielded purulent fluid. Sample sent for culture. Guidewire inserted followed by dilatation to insert a 10 [place] drain. Drain catheter retention loop formed in the abscess cavity and confirmed with CT. Syringe aspiration yielded 40 cc total fluid. Catheter secured with a Prolene suture and connected to external suction bulb. Sterile dressing applied. No immediate complication. Patient tolerated the procedure well. IMPRESSION: Successful CT-guided right lower quadrant appendiceal abscess drain insertion. Electronically Signed   By: Jerilynn Mages.  Shick M.D.   On: 11/29/2017 15:15    Assessment/Plan: 67 yo female with ruptured appendicitis.  --continue drain in place today --start clear liquid diet today. --decrease IV fluids --continue IV antibiotics.  Will transition to po antibiotics tomorrow.   Kelsey Woods, [place]

## 2017-12-01 MED ORDER — OXYCODONE HCL 5 MG PO TABS: 5.0000 mg | ORAL_TABLET | ORAL | Status: DC | PRN

## 2017-12-01 MED ORDER — METRONIDAZOLE 500 MG PO TABS
500.0000 mg | ORAL_TABLET | Freq: Three times a day (TID) | ORAL | Status: DC
  Administered 2017-12-01 – 2017-12-02 (×3): 500 mg via ORAL
  Filled 2017-12-01 (×4): qty 1

## 2017-12-01 MED ORDER — AMOXICILLIN-POT CLAVULANATE 875-125 MG PO TABS: 1.0000 | ORAL_TABLET | Freq: Two times a day (BID) | ORAL | Status: DC

## 2017-12-01 MED ORDER — CEFUROXIME AXETIL 500 MG PO TABS
500.0000 mg | ORAL_TABLET | Freq: Two times a day (BID) | ORAL | Status: DC
  Administered 2017-12-01 – 2017-12-02 (×3): 500 mg via ORAL
  Filled 2017-12-01 (×3): qty 1

## 2017-12-01 NOTE — Progress Notes (Signed)
12/01/2017  Subjective: No acute events overnight.  Patient reports that she is feeling better.  Tolerated clear liquids with no issues.  No fevers.  No nausea.  Had a bowel movement yesterday.  Vital signs: Temp:  [97.8 F (36.6 C)-98 F (36.7 C)] 98 F (36.7 C) (01/24 1302) Pulse Rate:  [55-65] 62 (01/24 1302) Resp:  [16-20] 16 (01/24 1302) BP: (99-118)/(52-66) 118/64 (01/24 1302) SpO2:  [95 %-99 %] 98 % (01/24 1302)   Intake/Output: 01/23 0701 - 01/24 0700 In: 3062.4 [P.O.:860; I.V.:2072.7; IV Piggyback:119.7] Out: 1530 [Urine:1500; Drains:30] Last BM Date: 11/30/17  Physical Exam: Constitutional: No acute distress Abdomen: Soft, nondistended, with mild tenderness to palpation in the right lower quadrant.  There is spot tenderness at the drain site insertion but otherwise no peritonitis.  Drain in place with seropurulent fluid in bulb.  Labs:  Recent Labs    11/29/17 0317 11/30/17 0252  WBC 11.2* 9.5  HGB 11.3* 9.9*  HCT 34.0* 30.0*  PLT 317 322   Recent Labs    11/29/17 0317 11/30/17 0252  NA 135 135  K 3.4* 3.5  CL 100* 100*  CO2 26 27  GLUCOSE 92 82  BUN 7 10  CREATININE 0.73 0.85  CALCIUM 7.9* 7.6*   Recent Labs    11/29/17 0317  LABPROT 15.7*  INR 1.26    Imaging: No results found.  Assessment/Plan: 67 year old female with ruptured appendicitis with large abscess status post percutaneous drainage.  - Cultures this afternoon came back growing E. coli.  Initially had switch the patient to Augmentin but this is intermediate resistance on cultures.  With discussion with pharmacy, she has been changed now to Ceftin and Flagyl. -Her diet will be advanced today. -IV fluids discontinued. -Possible discharge tomorrow.   Melvyn Neth, [place]

## 2017-12-02 LAB — CBC WITH DIFFERENTIAL/PLATELET
Basophils Absolute: 0 10*3/uL (ref 0–0.1)
Basophils Relative: 1 %
EOS PCT: 4 %
Eosinophils Absolute: 0.2 10*3/uL (ref 0–0.7)
HCT: 31.1 % — ABNORMAL LOW (ref 35.0–47.0)
Hemoglobin: 10.3 g/dL — ABNORMAL LOW (ref 12.0–16.0)
LYMPHS ABS: 1.4 10*3/uL (ref 1.0–3.6)
Lymphocytes Relative: 27 %
MCH: 31.2 pg (ref 26.0–34.0)
MCHC: 33 g/dL (ref 32.0–36.0)
MCV: 94.4 fL (ref 80.0–100.0)
MONO ABS: 0.4 10*3/uL (ref 0.2–0.9)
MONOS PCT: 7 %
Neutro Abs: 3.1 10*3/uL (ref 1.4–6.5)
Neutrophils Relative %: 61 %
PLATELETS: 346 10*3/uL (ref 150–440)
RBC: 3.29 MIL/uL — AB (ref 3.80–5.20)
RDW: 13.3 % (ref 11.5–14.5)
WBC: 5.1 10*3/uL (ref 3.6–11.0)

## 2017-12-02 MED ORDER — OXYCODONE HCL 5 MG PO TABS: 5.0000 mg | ORAL_TABLET | Freq: Four times a day (QID) | ORAL | 0 refills | Status: DC | PRN

## 2017-12-02 MED ORDER — CEFUROXIME AXETIL 500 MG PO TABS: 500.0000 mg | ORAL_TABLET | Freq: Two times a day (BID) | ORAL | 0 refills | Status: AC

## 2017-12-02 MED ORDER — METRONIDAZOLE 500 MG PO TABS: 500.0000 mg | ORAL_TABLET | Freq: Three times a day (TID) | ORAL | 0 refills | Status: AC

## 2017-12-02 NOTE — Care Management (Signed)
Patient to discharge home today.  Reported patient is independent.  Patient to discharge with drain in place, bedside RN to provide drain education prior to discharge

## 2017-12-02 NOTE — Discharge Summary (Signed)
Patient ID: Kelsey Woods MRN: [id number] DOB/AGE: January 15, 1951 67 y.o.  Admit date: 11/28/2017 Discharge date: 12/02/2017   Discharge Diagnoses:  Active Problems:   Ruptured appendicitis   Acute appendicitis with perforation and peritoneal abscess   Procedures:  IR percutaneous drain placement  Hospital Course:  Patient was admitted on 1/21 with ruptured appendicitis with abscess.  She had a percutaneous drain placed by IR on 1/22.  Her diet was slowly advanced and she was tolerating this without nausea.  She had bowel movements.  Her pain was improved and well controlled.  Her cultures grew back E coli and her oral antibiotics were changed to appropriate ones based on sensitivities.  She was deemed ready for discharge.  Consults:  None  Disposition: 01-Home or Self Care  Discharge Instructions    Call MD for:  difficulty breathing, headache or visual disturbances   Complete by:  As directed    Call MD for:  persistant nausea and vomiting   Complete by:  As directed    Call MD for:  severe uncontrolled pain   Complete by:  As directed    Call MD for:  temperature >100.4   Complete by:  As directed    Diet - low sodium heart healthy   Complete by:  As directed    Discharge instructions   Complete by:  As directed    1.  Patient may shower, but do not submerge in pool/tub. 2.  Change dressing once daily and as needed. 3.  Flush drain twice daily as instructed.  Please record drain output daily and bring record with you to office appointment.   Driving Restrictions   Complete by:  As directed    Do not drive while taking narcotics for pain control.   Increase activity slowly   Complete by:  As directed      Allergies as of 12/02/2017      Reactions   Aleve [naproxen Sodium]       Medication List    TAKE these medications   aspirin EC 81 MG tablet Take 81 mg by mouth daily.   cefUROXime 500 MG tablet Commonly known as:  CEFTIN Take 1 tablet (500 mg total) by  mouth 2 (two) times daily with a meal for 13 days.   cetirizine 10 MG tablet Commonly known as:  ZYRTEC Take 10 mg by mouth daily.   meloxicam 7.5 MG tablet Commonly known as:  MOBIC Take 7.5 mg by mouth daily.   metroNIDAZOLE 500 MG tablet Commonly known as:  FLAGYL Take 1 tablet (500 mg total) by mouth every 8 (eight) hours for 13 days.   oxyCODONE 5 MG immediate release tablet Commonly known as:  Oxy IR/ROXICODONE Take 1-2 tablets (5-10 mg total) by mouth every 6 (six) hours as needed for severe pain.   predniSONE 50 MG tablet Commonly known as:  DELTASONE Take 1 tablet (50 mg total) by mouth daily with breakfast.      Follow-up Information    Olean Ree, MD Follow up on 12/09/2017.   Specialty:  Surgery Why:  Appointment on 12/09/17 at 10:15 am.  Please arrive by 10 am. Contact information: [address] [place] [address] [phone]

## 2017-12-04 LAB — AEROBIC/ANAEROBIC CULTURE (SURGICAL/DEEP WOUND)

## 2017-12-04 LAB — AEROBIC/ANAEROBIC CULTURE W GRAM STAIN (SURGICAL/DEEP WOUND): Special Requests: NORMAL

## 2017-12-05 ENCOUNTER — Telehealth: Payer: Self-pay | Admitting: Surgery

## 2017-12-05 MED ORDER — FLUCONAZOLE 100 MG PO TABS: 100.0000 mg | ORAL_TABLET | Freq: Every day | ORAL | 0 refills | Status: DC

## 2017-12-05 NOTE — Telephone Encounter (Signed)
Call made to patient at this time. I advised her that I sent in a prescription for the yeast infection. I also advised her of some topical creams to try as well such as A&D, Destin, Monistat, and Butt Paste. She stated that she would try one of these with the prescription. I verbalized to her if nothing was working to give [place] a call. She verbalized understanding.

## 2017-12-05 NOTE — Telephone Encounter (Signed)
Patients calling and was see in the hospital by Dr. Hampton Abbot and was given some antibiotics, patient said she believes to have a yeast infection and is asking if we could call her in something for it. Patient is using [facility] in [place], please call and advise.

## 2017-12-06 ENCOUNTER — Other Ambulatory Visit: Payer: Self-pay

## 2017-12-09 ENCOUNTER — Encounter: Payer: Self-pay | Admitting: Surgery

## 2017-12-09 ENCOUNTER — Ambulatory Visit: Payer: Medicare Other | Admitting: Surgery

## 2017-12-09 VITALS — BP 116/80 | HR 120 | Temp 97.6°F | Ht 67.0 in | Wt 269.0 lb

## 2017-12-09 DIAGNOSIS — K3533 Acute appendicitis with perforation and localized peritonitis, with abscess: Secondary | ICD-10-CM | POA: Diagnosis not present

## 2017-12-09 MED ORDER — FLUCONAZOLE 150 MG PO TABS: 150.0000 mg | ORAL_TABLET | ORAL | 0 refills | Status: DC

## 2017-12-09 NOTE — Patient Instructions (Signed)
Please give [place] a call in case you have any questions or concerns.  We will refer you to have a colonoscopy in 6 weeks and then we will need to see you back to schedule your surgery.

## 2017-12-09 NOTE — Progress Notes (Signed)
12/09/2017  History of Present Illness: Kelsey Woods is a 67 y.o. female who presents as a follow-up for ruptured appendicitis with a large abscess.  She had a percutaneous drain placed on 1/22 and she was discharged to home on 1/25.  She presents today for her first follow-up.  She reports she has been doing well overall with some minor issues.  She does endorse having some nausea that she has been having since her discharge from the hospital which she thinks is attributed to medication.  She is also been having some pain at the drain insertion site but otherwise no worsening right lower quadrant pain.  Denies any fevers or chills.  She has been eating better but her appetite is not back to normal yet.  She did have a yeast infection and was given Diflucan earlier this week.  She does report she still has somewhat of a rash.  Past Medical History: Past Medical History:  Diagnosis Date  . Arthritis      Past Surgical History: Past Surgical History:  Procedure Laterality Date  . BREAST BIOPSY Right 1980's   Benign  . CESAREAN SECTION    . CHOLECYSTECTOMY    . REPLACEMENT TOTAL KNEE BILATERAL    . TOTAL HIP ARTHROPLASTY      Home Medications: Prior to Admission medications   Medication Sig Start Date End Date Taking? Authorizing Provider  aspirin EC 81 MG tablet Take 81 mg by mouth daily.   Yes [provider]  cefUROXime (CEFTIN) 500 MG tablet Take 1 tablet (500 mg total) by mouth 2 (two) times daily with a meal for 13 days. 12/02/17 12/15/17 Yes Alija Riano, Jacqulyn Bath, MD  cetirizine (ZYRTEC) 10 MG tablet Take 10 mg by mouth daily.   Yes [provider]  EPINEPHrine 0.3 mg/0.3 mL IJ SOAJ injection Inject 1 mg into the muscle Once PRN. 07/28/16  Yes [provider]  esomeprazole (NEXIUM) 20 MG capsule Take 1 capsule by mouth 1 day or 1 dose. 10/11/17  Yes [provider]  fluconazole (DIFLUCAN) 150 MG tablet Take 1 tablet (150 mg total) by mouth every 3  (three) days. Take 1 tablet every 72 hours. 12/09/17  Yes Avalynne Diver, Jacqulyn Bath, MD  fluticasone (FLONASE) 50 MCG/ACT nasal spray Place 2 sprays into both nostrils 1 day or 1 dose. 06/16/17  Yes [provider]  LORazepam (ATIVAN) 0.5 MG tablet Take 1 tablet by mouth 3 (three) times daily. 07/02/15  Yes [provider]  meloxicam (MOBIC) 7.5 MG tablet Take 7.5 mg by mouth daily.    Yes [provider]  metroNIDAZOLE (FLAGYL) 500 MG tablet Take 1 tablet (500 mg total) by mouth every 8 (eight) hours for 13 days. 12/02/17 12/15/17 Yes Taylore Hinde, Jacqulyn Bath, MD  oxyCODONE (OXY IR/ROXICODONE) 5 MG immediate release tablet Take 1-2 tablets (5-10 mg total) by mouth every 6 (six) hours as needed for severe pain. 12/02/17  Yes Meloney Feld, Jacqulyn Bath, MD  tiZANidine (ZANAFLEX) 4 MG tablet Take 1 tablet by mouth 2 (two) times daily as needed. 11/13/15  Yes [provider]  traMADol (ULTRAM) 50 MG tablet Take 1 tablet by mouth 2 (two) times daily as needed. 12/24/15  Yes [provider]    Allergies: Allergies  Allergen Reactions  . Naproxen Sodium Swelling  . Sulfa Antibiotics     Other reaction(s): Other (See Comments) Eye drop only, can take oral    Review of Systems: Review of Systems  Constitutional: Negative for chills and fever.  Respiratory: Negative  for shortness of breath.   Cardiovascular: Negative for chest pain.  Gastrointestinal: Positive for abdominal pain and nausea. Negative for constipation, diarrhea and vomiting.    Physical Exam BP 116/80   Pulse (!) 120   Temp 97.6 F (36.4 C) (Oral)   Ht 5\' 7"  (1.702 m)   Wt 122 kg (269 lb)   BMI 42.13 kg/m  CONSTITUTIONAL: No acute distress RESPIRATORY:  Lungs are clear, and breath sounds are equal bilaterally. Normal respiratory effort without pathologic use of accessory muscles. CARDIOVASCULAR: Heart is regular without murmurs, gallops, or rubs. GI: The abdomen is soft, obese, nondistended, nontender to palpation.  Right  lower quadrant drain is in place and intact with serosanguineous fluid coming out.  SKIN: Skin turgor is normal. There are no pathologic skin lesions.  NEUROLOGIC:  Motor and sensation is grossly normal.  Cranial nerves are grossly intact. PSYCH:  Alert and oriented to person, place and time. Affect is normal.  Labs/Imaging: None since her discharge.  Assessment and Plan: This is a 67 y.o. female who presents for follow-up from a perforated appendicitis with abscess.  The patient has had low volume coming out from the drain and is serosanguineous.  We will remove the drain today.  There were no complications on drain removal.  Dry gauze dressing applied to the drain site.  Instructed the patient to continue dressing change once daily until the drain site has closed up.  She still has a few more days of antibiotics and she should continue them until completed.  Discussed with the patient that the next step for management of her appendicitis would be to obtain a colonoscopy somewhere in the 6-8-week range to evaluate her colon for any potential or suspicious masses that could have contributed to the appendicitis.  This would be less likely but the patient understands that this is just to confirm no other issues.  If the colonoscopy is negative then we should be able to schedule her for a laparoscopic appendectomy in the future.  She will follow-up with [place] after the colonoscopy is done to discuss surgery.  For now we will give her a prescription to refill her Diflucan for treatment of her yeast infection.  Patient understands this plan and all of her questions have been answered.  Face-to-face time spent with the patient and care providers was 25 minutes, with more than 50% of the time spent counseling, educating, and coordinating care of the patient.     Melvyn Neth, [place]

## 2017-12-20 ENCOUNTER — Other Ambulatory Visit: Payer: Self-pay

## 2017-12-20 DIAGNOSIS — Z1211 Encounter for screening for malignant neoplasm of colon: Secondary | ICD-10-CM

## 2017-12-29 ENCOUNTER — Encounter
Admission: RE | Disposition: A | Discharge status: Home / Self Care | Payer: Self-pay | Source: Ambulatory Visit | Attending: Gastroenterology

## 2017-12-29 ENCOUNTER — Ambulatory Visit: Payer: Medicare Other | Admitting: Certified Registered Nurse Anesthetist

## 2017-12-29 ENCOUNTER — Encounter: Payer: Self-pay | Admitting: Certified Registered Nurse Anesthetist

## 2017-12-29 ENCOUNTER — Ambulatory Visit
Admission: RE | Admit: 2017-12-29 | Discharge: 2017-12-29 | Disposition: A | Discharge status: Home / Self Care | Payer: Medicare Other | Source: Ambulatory Visit | Attending: Gastroenterology | Admitting: Gastroenterology

## 2017-12-29 DIAGNOSIS — Z96649 Presence of unspecified artificial hip joint: Secondary | ICD-10-CM | POA: Insufficient documentation

## 2017-12-29 DIAGNOSIS — Z7982 Long term (current) use of aspirin: Secondary | ICD-10-CM | POA: Insufficient documentation

## 2017-12-29 DIAGNOSIS — Z882 Allergy status to sulfonamides status: Secondary | ICD-10-CM | POA: Insufficient documentation

## 2017-12-29 DIAGNOSIS — Z886 Allergy status to analgesic agent status: Secondary | ICD-10-CM | POA: Diagnosis not present

## 2017-12-29 DIAGNOSIS — G4730 Sleep apnea, unspecified: Secondary | ICD-10-CM | POA: Insufficient documentation

## 2017-12-29 DIAGNOSIS — Z96653 Presence of artificial knee joint, bilateral: Secondary | ICD-10-CM | POA: Insufficient documentation

## 2017-12-29 DIAGNOSIS — Z79899 Other long term (current) drug therapy: Secondary | ICD-10-CM | POA: Insufficient documentation

## 2017-12-29 DIAGNOSIS — M1990 Unspecified osteoarthritis, unspecified site: Secondary | ICD-10-CM | POA: Diagnosis not present

## 2017-12-29 DIAGNOSIS — K5730 Diverticulosis of large intestine without perforation or abscess without bleeding: Secondary | ICD-10-CM | POA: Diagnosis not present

## 2017-12-29 DIAGNOSIS — D120 Benign neoplasm of cecum: Secondary | ICD-10-CM | POA: Insufficient documentation

## 2017-12-29 DIAGNOSIS — E039 Hypothyroidism, unspecified: Secondary | ICD-10-CM | POA: Insufficient documentation

## 2017-12-29 DIAGNOSIS — Z1211 Encounter for screening for malignant neoplasm of colon: Secondary | ICD-10-CM | POA: Diagnosis present

## 2017-12-29 HISTORY — PX: COLONOSCOPY WITH PROPOFOL: SHX5780

## 2017-12-29 SURGERY — COLONOSCOPY WITH PROPOFOL
Anesthesia: General

## 2017-12-29 MED ORDER — PROPOFOL 10 MG/ML IV BOLUS
INTRAVENOUS | Status: AC
  Filled 2017-12-29: qty 20

## 2017-12-29 MED ORDER — LIDOCAINE HCL (CARDIAC) 20 MG/ML IV SOLN
INTRAVENOUS | Status: DC | PRN
  Administered 2017-12-29: 30 mg via INTRAVENOUS

## 2017-12-29 MED ORDER — PROPOFOL 500 MG/50ML IV EMUL
INTRAVENOUS | Status: DC | PRN
  Administered 2017-12-29: 125 ug/kg/min via INTRAVENOUS

## 2017-12-29 MED ORDER — SODIUM CHLORIDE 0.9 % IV SOLN
INTRAVENOUS | Status: DC
  Administered 2017-12-29: 12:00:00 via INTRAVENOUS
  Administered 2017-12-29: 1000 mL via INTRAVENOUS

## 2017-12-29 MED ORDER — PROPOFOL 10 MG/ML IV BOLUS
INTRAVENOUS | Status: DC | PRN
  Administered 2017-12-29: 80 mg via INTRAVENOUS
  Administered 2017-12-29: 30 mg via INTRAVENOUS

## 2017-12-29 MED ORDER — ETOMIDATE 2 MG/ML IV SOLN
INTRAVENOUS | Status: AC
  Filled 2017-12-29: qty 10

## 2017-12-29 NOTE — Op Note (Signed)
[facility] Patient Name: Kelsey Woods Procedure Date: 12/29/2017 11:49 AM MRN: [id number] Account #: [id number] Date of Birth: 01-Jul-1951 Admit Type: Outpatient Age: 67 Room: [facility] ENDO ROOM 4 Gender: Female Note Status: Finalized Procedure:            Colonoscopy Indications:          Screening for colorectal malignant neoplasm, Last                        colonoscopy: January 2013 Providers:            Lin Landsman MD, MD Referring MD:         Baxter Hire, MD (Referring MD) Medicines:            Monitored Anesthesia Care Complications:        No immediate complications. Estimated blood loss: None. Procedure:            Pre-Anesthesia Assessment:                       - Prior to the procedure, a History and Physical was                        performed, and patient medications and allergies were                        reviewed. The patient is competent. The risks and                        benefits of the procedure and the sedation options and                        risks were discussed with the patient. All questions                        were answered and informed consent was obtained.                        Patient identification and proposed procedure were                        verified by the physician, the nurse, the                        anesthesiologist, the anesthetist and the technician in                        the pre-procedure area in the procedure room in the                        endoscopy suite. Mental Status Examination: alert and                        oriented. Airway Examination: normal oropharyngeal                        airway and neck mobility. Respiratory Examination:                        clear to auscultation. CV Examination: normal.  Prophylactic Antibiotics: The patient does not require                        prophylactic antibiotics. Prior Anticoagulants: The           patient has taken aspirin, last dose was 1 day prior to                        procedure. ASA Grade Assessment: III - A patient with                        severe systemic disease. After reviewing the risks and                        benefits, the patient was deemed in satisfactory                        condition to undergo the procedure. The anesthesia plan                        was to use monitored anesthesia care (MAC). Immediately                        prior to administration of medications, the patient was                        re-assessed for adequacy to receive sedatives. The                        heart rate, respiratory rate, oxygen saturations, blood                        pressure, adequacy of pulmonary ventilation, and                        response to care were monitored throughout the                        procedure. The physical status of the patient was                        re-assessed after the procedure.                       After obtaining informed consent, the colonoscope was                        passed under direct vision. Throughout the procedure,                        the patient's blood pressure, pulse, and oxygen                        saturations were monitored continuously. The                        Colonoscope was introduced through the anus and                        advanced to the the cecum, identified by appendiceal  orifice and ileocecal valve. The colonoscopy was                        performed without difficulty. The patient tolerated the                        procedure well. The quality of the bowel preparation                        was evaluated using the BBPS [facility] Bowel Preparation                        Scale) with scores of: Right Colon = 3, Transverse                        Colon = 3 and Left Colon = 3 (entire mucosa seen well                        with no residual staining, small fragments of stool or                         opaque liquid). The total BBPS score equals 9. Findings:      A 8 mm polyp was found in the cecum. The polyp was sessile. The polyp       was removed with a hot snare. Resection and retrieval were complete.      A few diverticula were found in the sigmoid colon.      The retroflexed view of the distal rectum and anal verge was normal and       showed no anal or rectal abnormalities.      The exam was otherwise without abnormality. Impression:           - One 8 mm polyp in the cecum, removed with a hot                        snare. Resected and retrieved.                       - Diverticulosis in the sigmoid colon.                       - The distal rectum and anal verge are normal on                        retroflexion view.                       - The examination was otherwise normal. Recommendation:       - Discharge patient to home.                       - Resume previous diet today.                       - Continue present medications.                       - Await pathology results.                       -  Repeat colonoscopy in 5 years for surveillance. Procedure Code(s):    --- Professional ---                       [phone], Colonoscopy, flexible; with removal of tumor(s),                        polyp(s), or other lesion(s) by snare technique Diagnosis Code(s):    --- Professional ---                       Z12.11, Encounter for screening for malignant neoplasm                        of colon                       D12.0, Benign neoplasm of cecum                       K57.30, Diverticulosis of large intestine without                        perforation or abscess without bleeding CPT copyright 2016 American Medical Association. All rights reserved. The codes documented in this report are preliminary and upon coder review may  be revised to meet current compliance requirements. Dr. Ulyess Mort Lin Landsman MD, MD 12/29/2017 12:30:18 PM This report has  been signed electronically. Number of Addenda: 0 Note Initiated On: 12/29/2017 11:49 AM Scope Withdrawal Time: 0 hours 11 minutes 46 seconds  Total Procedure Duration: 0 hours 18 minutes 34 seconds       [facility]

## 2017-12-29 NOTE — Transfer of Care (Signed)
Immediate Anesthesia Transfer of Care Note  Patient: Kelsey Woods  Procedure(s) Performed: COLONOSCOPY WITH PROPOFOL (N/A )  Patient Location: PACU and Endoscopy Unit  Anesthesia Type:General  Level of Consciousness: awake  Airway & Oxygen Therapy: Patient Spontanous Breathing  Post-op Assessment: Report given to RN  Post vital signs: stable  Last Vitals:  Vitals:   12/29/17 1133 12/29/17 1232  BP:  109/67  Pulse: (!) 102   Resp: 16   Temp: (!) 35.8 C (!) 36.1 C  SpO2: 96%     Last Pain:  Vitals:   12/29/17 1232  TempSrc: Tympanic  PainSc:          Complications: No apparent anesthesia complications

## 2017-12-29 NOTE — Anesthesia Procedure Notes (Signed)
Performed by: Dawayne Cirri I, CRNA Patient Re-evaluated:Patient Re-evaluated prior to induction Oxygen Delivery Method: Nasal cannula Induction Type: IV induction Intubation method: Natural airway maintained by pt.

## 2017-12-29 NOTE — Anesthesia Post-op Follow-up Note (Signed)
Anesthesia QCDR form completed.        

## 2017-12-29 NOTE — Anesthesia Postprocedure Evaluation (Signed)
Anesthesia Post Note  Patient: Kelsey Woods  Procedure(s) Performed: COLONOSCOPY WITH PROPOFOL (N/A )  Patient location during evaluation: Endoscopy Anesthesia Type: General Level of consciousness: awake and alert Pain management: pain level controlled Vital Signs Assessment: post-procedure vital signs reviewed and stable Respiratory status: spontaneous breathing and respiratory function stable Cardiovascular status: stable Anesthetic complications: no     Last Vitals:  Vitals:   12/29/17 1133 12/29/17 1232  BP:  109/67  Pulse: (!) 102   Resp: 16   Temp: (!) 35.8 C (!) 36.1 C  SpO2: 96%     Last Pain:  Vitals:   12/29/17 1232  TempSrc: Tympanic  PainSc:                  [name]

## 2017-12-29 NOTE — Anesthesia Preprocedure Evaluation (Signed)
Anesthesia Evaluation  Patient identified by MRN, date of birth, ID band Patient awake    Reviewed: Allergy & Precautions, NPO status , Patient's Chart, lab work & pertinent test results  History of Anesthesia Complications Negative for: history of anesthetic complications  Airway Mallampati: III       Dental   Pulmonary sleep apnea (not using CPAP, was told she did not really have sleep apnea by Dr. Vella Kohler) , neg COPD,           Cardiovascular (-) hypertension(-) Past MI and (-) CHF (-) dysrhythmias (-) Valvular Problems/Murmurs     Neuro/Psych neg Seizures    GI/Hepatic Neg liver ROS, neg GERD  ,  Endo/Other  neg diabetesHypothyroidism (was on meds for a couple years, now mildly hypothyroid but no need for meds)   Renal/GU Renal InsufficiencyRenal disease     Musculoskeletal   Abdominal   Peds  Hematology   Anesthesia Other Findings   Reproductive/Obstetrics                             Anesthesia Physical Anesthesia Plan  ASA: III  Anesthesia Plan: General   Post-op Pain Management:    Induction:   PONV Risk Score and Plan: 3 and TIVA, Propofol infusion and Midazolam  Airway Management Planned: Nasal Cannula  Additional Equipment:   Intra-op Plan:   Post-operative Plan:   Informed Consent: I have reviewed the patients History and Physical, chart, labs and discussed the procedure including the risks, benefits and alternatives for the proposed anesthesia with the patient or authorized representative who has indicated his/her understanding and acceptance.     Plan Discussed with:   Anesthesia Plan Comments:         Anesthesia Quick Evaluation

## 2017-12-29 NOTE — H&P (Signed)
Cephas Darby, MD [address]  [place]  [place], [place] [address]  Main: [phone]  Fax: [phone] Pager: [phone]  Primary Care Physician:  Katheren Shams Primary Gastroenterologist:  Dr. Cephas Darby  Pre-Procedure History & Physical: HPI:  Kelsey Woods is a 67 y.o. female is here for an colonoscopy.   Past Medical History:  Diagnosis Date  . Arthritis     Past Surgical History:  Procedure Laterality Date  . BREAST BIOPSY Right 1980's   Benign  . CESAREAN SECTION    . CHOLECYSTECTOMY    . REPLACEMENT TOTAL KNEE BILATERAL    . TOTAL HIP ARTHROPLASTY      Prior to Admission medications   Medication Sig Start Date End Date Taking? Authorizing Provider  aspirin EC 81 MG tablet Take 81 mg by mouth daily.    [provider]  cetirizine (ZYRTEC) 10 MG tablet Take 10 mg by mouth daily.    [provider]  EPINEPHrine 0.3 mg/0.3 mL IJ SOAJ injection Inject 1 mg into the muscle Once PRN. 07/28/16   [provider]  esomeprazole (NEXIUM) 20 MG capsule Take 1 capsule by mouth 1 day or 1 dose. 10/11/17   [provider]  fluconazole (DIFLUCAN) 150 MG tablet Take 1 tablet (150 mg total) by mouth every 3 (three) days. Take 1 tablet every 72 hours. 12/09/17   Piscoya, Jacqulyn Bath, MD  fluticasone (FLONASE) 50 MCG/ACT nasal spray Place 2 sprays into both nostrils 1 day or 1 dose. 06/16/17   [provider]  LORazepam (ATIVAN) 0.5 MG tablet Take 1 tablet by mouth 3 (three) times daily. 07/02/15   [provider]  meloxicam (MOBIC) 7.5 MG tablet Take 7.5 mg by mouth daily.     [provider]  oxyCODONE (OXY IR/ROXICODONE) 5 MG immediate release tablet Take 1-2 tablets (5-10 mg total) by mouth every 6 (six) hours as needed for severe pain. 12/02/17   Olean Ree, MD  tiZANidine (ZANAFLEX) 4 MG tablet Take 1 tablet by mouth 2 (two) times daily as needed. 11/13/15   [provider]  traMADol (ULTRAM)  50 MG tablet Take 1 tablet by mouth 2 (two) times daily as needed. 12/24/15   [provider]    Allergies as of 12/20/2017 - Review Complete 12/09/2017  Allergen Reaction Noted  . Naproxen sodium Swelling 07/13/2016  . Sulfa antibiotics      No family history on file.  Social History   Socioeconomic History  . Marital status: Married    Spouse name: Not on file  . Number of children: Not on file  . Years of education: Not on file  . Highest education level: Not on file  Social Needs  . Financial resource strain: Not on file  . Food insecurity - worry: Not on file  . Food insecurity - inability: Not on file  . Transportation needs - medical: Not on file  . Transportation needs - non-medical: Not on file  Occupational History  . Not on file  Tobacco Use  . Smoking status: Never Smoker  . Smokeless tobacco: Never Used  Substance and Sexual Activity  . Alcohol use: Yes    Comment: weekends  . Drug use: No  . Sexual activity: Yes  Other Topics Concern  . Not on file  Social History Narrative  . Not on file    Review of Systems: See HPI, otherwise negative ROS  Physical Exam: Pulse (!) 102   Temp (!) 96.5 F (  35.8 C) (Tympanic)   Resp 16   Ht 5\' 7"  (1.702 m)   Wt 269 lb (122 kg)   SpO2 96%   BMI 42.13 kg/m  General:   Alert,  pleasant and cooperative in NAD Head:  Normocephalic and atraumatic. Neck:  Supple; no masses or thyromegaly. Lungs:  Clear throughout to auscultation.    Heart:  Regular rate and rhythm. Abdomen:  Soft, nontender and nondistended. Normal bowel sounds, without guarding, and without rebound.   Neurologic:  Alert and  oriented x4;  grossly normal neurologically.  Impression/Plan: Kelsey Woods is here for an colonoscopy to be performed for colon cancer screening  Risks, benefits, limitations, and alternatives regarding  colonoscopy have been reviewed with the patient.  Questions have been answered.  All parties  agreeable.   Sherri Sear, MD  12/29/2017, 11:53 AM

## 2017-12-30 LAB — SURGICAL PATHOLOGY

## 2018-01-02 ENCOUNTER — Encounter: Payer: Self-pay | Admitting: Gastroenterology

## 2018-01-12 ENCOUNTER — Ambulatory Visit: Payer: Medicare Other | Admitting: Surgery

## 2018-01-12 ENCOUNTER — Encounter: Payer: Self-pay | Admitting: Surgery

## 2018-01-12 VITALS — BP 139/75 | HR 78 | Temp 97.8°F | Ht 67.0 in | Wt 267.0 lb

## 2018-01-12 DIAGNOSIS — K3533 Acute appendicitis with perforation and localized peritonitis, with abscess: Secondary | ICD-10-CM

## 2018-01-12 MED ORDER — METRONIDAZOLE 500 MG PO TABS: 500.0000 mg | ORAL_TABLET | Freq: Two times a day (BID) | ORAL | 0 refills | Status: AC

## 2018-01-12 MED ORDER — CEFUROXIME AXETIL 500 MG PO TABS: 500.0000 mg | ORAL_TABLET | Freq: Two times a day (BID) | ORAL | 0 refills | Status: DC

## 2018-01-12 NOTE — Progress Notes (Signed)
01/12/2018  History of Present Illness: Kelsey Woods is a 67 y.o. female who presents for follow-up from a ruptured appendicitis with abscess which required drainage by interventional radiology on 11/29/17.  She was discharged to home with antibiotics and has completed her course.  She was seen by gastroenterology and had a colonoscopy on 2/21.  A cecal polyp was found and resected and is showed a tubular adenoma negative for high-grade dysplasia or malignancy.  Presents now to schedule surgery for her appendectomy.  She reports that over the last few days, she has been having some mild vague symptoms of right lower quadrant abdominal pain with a "stabbing" sensation at times.  Denies having any fevers or chills.  However she is worried that her abscess may be coming back again.  Denies any nausea or vomiting has been eating well but feels that her appetite has decreased slightly.  Past Medical History: Past Medical History:  Diagnosis Date  . Arthritis      Past Surgical History: Past Surgical History:  Procedure Laterality Date  . BREAST BIOPSY Right 1980's   Benign  . CESAREAN SECTION    . CHOLECYSTECTOMY    . COLONOSCOPY WITH PROPOFOL N/A 12/29/2017   Procedure: COLONOSCOPY WITH PROPOFOL;  Surgeon: Lin Landsman, MD;  Location[facility] ENDOSCOPY;  Service: Gastroenterology;  Laterality: N/A;  . REPLACEMENT TOTAL KNEE BILATERAL    . TOTAL HIP ARTHROPLASTY      Home Medications: Prior to Admission medications   Medication Sig Start Date End Date Taking? Authorizing Provider  aspirin EC 81 MG tablet Take 81 mg by mouth daily.   Yes [provider]  cetirizine (ZYRTEC) 10 MG tablet Take 10 mg by mouth daily.   Yes [provider]  meloxicam (MOBIC) 7.5 MG tablet Take 7.5 mg by mouth daily.    Yes [provider]  cefUROXime (CEFTIN) 500 MG tablet Take 1 tablet (500 mg total) by mouth 2 (two) times daily with a meal. 01/12/18   Afton Lavalle, MD   EPINEPHrine 0.3 mg/0.3 mL IJ SOAJ injection Inject 1 mg into the muscle Once PRN. 07/28/16   [provider]  esomeprazole (NEXIUM) 20 MG capsule Take 1 capsule by mouth 1 day or 1 dose. 10/11/17   [provider]  fluticasone (FLONASE) 50 MCG/ACT nasal spray Place 2 sprays into both nostrils 1 day or 1 dose. 06/16/17   [provider]  LORazepam (ATIVAN) 0.5 MG tablet Take 1 tablet by mouth 3 (three) times daily. 07/02/15   [provider]  metroNIDAZOLE (FLAGYL) 500 MG tablet Take 1 tablet (500 mg total) by mouth 2 (two) times daily for 10 days. 01/12/18 01/22/18  Olean Ree, MD  oxyCODONE (OXY IR/ROXICODONE) 5 MG immediate release tablet Take 1-2 tablets (5-10 mg total) by mouth every 6 (six) hours as needed for severe pain. Patient not taking: Reported on 01/12/2018 12/02/17   Olean Ree, MD  tiZANidine (ZANAFLEX) 4 MG tablet Take 1 tablet by mouth 2 (two) times daily as needed. 11/13/15   [provider]  traMADol (ULTRAM) 50 MG tablet Take 1 tablet by mouth 2 (two) times daily as needed. 12/24/15   [provider]    Allergies: Allergies  Allergen Reactions  . Naproxen Sodium Swelling  . Sulfa Antibiotics     Other reaction(s): Other (See Comments) Eye drop only, can take oral    Review of Systems: Review of Systems  Constitutional: Negative for chills and fever.  Respiratory: Negative for  shortness of breath.   Cardiovascular: Negative for chest pain.  Gastrointestinal: Positive for abdominal pain. Negative for constipation, diarrhea, nausea and vomiting.       Decreased appetite    Physical Exam BP 139/75   Pulse 78   Temp 97.8 F (36.6 C) (Oral)   Ht 5\' 7"  (1.702 m)   Wt 121.1 kg (267 lb)   BMI 41.82 kg/m  CONSTITUTIONAL: No acute distress RESPIRATORY:  Lungs are clear, and breath sounds are equal bilaterally. Normal respiratory effort without pathologic use of accessory muscles. CARDIOVASCULAR: Heart is regular without  murmurs, gallops, or rubs. GI: The abdomen is soft, obese, nondistended, with mild discomfort to palpation in the right lower quadrant.  No peritoneal signs.  MUSCULOSKELETAL:  Normal muscle strength and tone in all four extremities.  No peripheral edema or cyanosis. SKIN: Skin turgor is normal. There are no pathologic skin lesions.  NEUROLOGIC:  Motor and sensation is grossly normal.  Cranial nerves are grossly intact. PSYCH:  Alert and oriented to person, place and time. Affect is normal.  Labs/Imaging: Pathology report 2/21: A. COLON POLYP, CECUM; HOT SNARE:  - TUBULAR ADENOMA.  - NEGATIVE FOR HIGH GRADE DYSPLASIA AND MALIGNANCY.  Assessment and Plan: This is a 67 y.o. female who presents for follow-up from a ruptured appendicitis requiring interventional radiology drainage.  Discussed with the patient now she has had a colonoscopy, we can schedule her for laparoscopic appendectomy.  Currently given that she is having mild symptom recurrence, will start her on a course of antibiotics which would be the same was that she was discharged to home with particularly cefuroxime and Flagyl.  She will have a prescription for a 10-day course.  Then we will schedule her surgery for 3/19 for laparoscopic appendectomy.  Discussed with the patient the risks of bleeding, infection, injury to surrounding structures.  Discussed also that if the appendix base is significantly inflamed, we may have to remove his liver of cecum as well and she is also understanding that we may need to do more extensive resection if the inflammation is very significant.  She is willing to do this and understands the risks of this as well.  If there is any abscess remnant, would also leave a drain and she understands this too.  She also knows to stop her aspirin and Mobic 3 days prior to her surgery.  Face-to-face time spent with the patient and care providers was 25 minutes, with more than 50% of the time spent counseling,  educating, and coordinating care of the patient.     Melvyn Neth, [place]

## 2018-01-12 NOTE — Patient Instructions (Signed)
We have scheduled you for an elective Appendectomy. Please see the following information regarding your surgery.   This has been scheduled for 01/24/2018 with Dr. Hampton Abbot at [facility]. Please see your blue (pre-care) sheet for further instructions.   Typically, our patients are out of work 1-2 weeks following the surgery and may return with a lifting restriction of no more than 15 lbs. For a complete 6 weeks following surgery. If you need [place] to fill out any FMLA or disability paperwork for your employer, please submit that to our office as soon as you can. This is completed within 3 days of your scheduled surgery and requires a $25.00 one time fee that can be paid over the phone or at our front desk.  Also, please review the [facility]) Pre-Care sheet for further information regarding your surgery. If you have any questions, please do not hesitate to contact our office.  Laparoscopic Appendectomy, Adult, Care After Refer to this sheet in the next few weeks. These instructions provide you with information about caring for yourself after your procedure. Your health care provider may also give you more specific instructions. Your treatment has been planned according to current medical practices, but problems sometimes occur. Call your health care provider if you have any problems or questions after your procedure. What can I expect after the procedure? After the procedure, it is common to have:  A decrease in your energy level.  Mild pain in the area where the surgical cuts (incisions) were made.  Constipation. This can be caused by pain medicine and a decrease in your activity.  Follow these instructions at home: Medicines  Take over-the-counter and prescription medicines only as told by your health care provider.  Do not drive for 24 hours if you received a sedative.  Do not drive or operate heavy machinery while taking prescription pain medicine.  If you were prescribed an antibiotic medicine, take  it as told by your health care provider. Do not stop taking the antibiotic even if you start to feel better. Activity  For 3 weeks or as long as told by your health care provider: ? Do not lift anything that is heavier than 10 pounds (4.5 kg). ? Do not play contact sports.  Gradually return to your normal activities. Ask your health care provider what activities are safe for you. Bathing  Keep your incisions clean and dry. Clean them as often as told by your health care provider: ? Gently wash the incisions with soap and water. ? Rinse the incisions with water to remove all soap. ? Pat the incisions dry with a clean towel. Do not rub the incisions.  You may take showers after 48 hours.  Do not take baths, swim, or use hot tubs for 2 weeks or as told by your health care provider. Incision care  Follow instructions from your healthcare provider about how to take care of your incisions. Make sure you: ? Wash your hands with soap and water before you change your bandage (dressing). If soap and water are not available, use hand sanitizer. ? Change your dressing as told by your health care provider. ? Leave stitches (sutures), skin glue, or adhesive strips in place. These skin closures may need to stay in place for 2 weeks or longer. If adhesive strip edges start to loosen and curl up, you may trim the loose edges. Do not remove adhesive strips completely unless your health care provider tells you to do that.  Check your incision areas every  day for signs of infection. Check for: ? More redness, swelling, or pain. ? More fluid or blood. ? Warmth. ? Pus or a bad smell. Other Instructions  If you were sent home with a drain, follow instructions from your health care provider about how to care for the drain and how to empty it.  Take deep breaths. This helps to prevent your lungs from becoming inflamed.  To relieve and prevent constipation: ? Drink plenty of fluids. ? Eat plenty of  fruits and vegetables.  Keep all follow-up visits as told by your health care provider. This is important. Contact a health care provider if:  You have more redness, swelling, or pain around an incision.  You have more fluid or blood coming from an incision.  Your incision feels warm to the touch.  You have pus or a bad smell coming from an incision or dressing.  Your incision edges break open after your sutures have been removed.  You have increasing pain in your shoulders.  You feel dizzy or you faint.  You develop shortness of breath.  You keep feeling nauseous or vomiting.  You have diarrhea or you cannot control your bowel functions.  You lose your appetite.  You develop swelling or pain in your legs. Get help right away if:  You have a fever.  You develop a rash.  You have difficulty breathing.  You have sharp pains in your chest. This information is not intended to replace advice given to you by your health care provider. Make sure you discuss any questions you have with your health care provider. Document Released: 10/25/2005 Document Revised: 03/26/2016 Document Reviewed: 04/14/2015 Elsevier Interactive Patient Education  2017 [employer].

## 2018-01-12 NOTE — Addendum Note (Signed)
Addended by: Riki Sheer on: 01/12/2018 06:18 PM   Modules accepted: Orders, SmartSet

## 2018-01-12 NOTE — H&P (View-Only) (Signed)
01/12/2018  History of Present Illness: Kelsey Woods is a 67 y.o. female who presents for follow-up from a ruptured appendicitis with abscess which required drainage by interventional radiology on 11/29/17.  She was discharged to home with antibiotics and has completed her course.  She was seen by gastroenterology and had a colonoscopy on 2/21.  A cecal polyp was found and resected and is showed a tubular adenoma negative for high-grade dysplasia or malignancy.  Presents now to schedule surgery for her appendectomy.  She reports that over the last few days, she has been having some mild vague symptoms of right lower quadrant abdominal pain with a "stabbing" sensation at times.  Denies having any fevers or chills.  However she is worried that her abscess may be coming back again.  Denies any nausea or vomiting has been eating well but feels that her appetite has decreased slightly.  Past Medical History: Past Medical History:  Diagnosis Date  . Arthritis      Past Surgical History: Past Surgical History:  Procedure Laterality Date  . BREAST BIOPSY Right 1980's   Benign  . CESAREAN SECTION    . CHOLECYSTECTOMY    . COLONOSCOPY WITH PROPOFOL N/A 12/29/2017   Procedure: COLONOSCOPY WITH PROPOFOL;  Surgeon: Lin Landsman, MD;  Location: [facility] ENDOSCOPY;  Service: Gastroenterology;  Laterality: N/A;  . REPLACEMENT TOTAL KNEE BILATERAL    . TOTAL HIP ARTHROPLASTY      Home Medications: Prior to Admission medications   Medication Sig Start Date End Date Taking? Authorizing Provider  aspirin EC 81 MG tablet Take 81 mg by mouth daily.   Yes [provider]  cetirizine (ZYRTEC) 10 MG tablet Take 10 mg by mouth daily.   Yes [provider]  meloxicam (MOBIC) 7.5 MG tablet Take 7.5 mg by mouth daily.    Yes [provider]  cefUROXime (CEFTIN) 500 MG tablet Take 1 tablet (500 mg total) by mouth 2 (two) times daily with a meal. 01/12/18   Natalina Wieting, MD   EPINEPHrine 0.3 mg/0.3 mL IJ SOAJ injection Inject 1 mg into the muscle Once PRN. 07/28/16   [provider]  esomeprazole (NEXIUM) 20 MG capsule Take 1 capsule by mouth 1 day or 1 dose. 10/11/17   [provider]  fluticasone (FLONASE) 50 MCG/ACT nasal spray Place 2 sprays into both nostrils 1 day or 1 dose. 06/16/17   [provider]  LORazepam (ATIVAN) 0.5 MG tablet Take 1 tablet by mouth 3 (three) times daily. 07/02/15   [provider]  metroNIDAZOLE (FLAGYL) 500 MG tablet Take 1 tablet (500 mg total) by mouth 2 (two) times daily for 10 days. 01/12/18 01/22/18  Olean Ree, MD  oxyCODONE (OXY IR/ROXICODONE) 5 MG immediate release tablet Take 1-2 tablets (5-10 mg total) by mouth every 6 (six) hours as needed for severe pain. Patient not taking: Reported on 01/12/2018 12/02/17   Olean Ree, MD  tiZANidine (ZANAFLEX) 4 MG tablet Take 1 tablet by mouth 2 (two) times daily as needed. 11/13/15   [provider]  traMADol (ULTRAM) 50 MG tablet Take 1 tablet by mouth 2 (two) times daily as needed. 12/24/15   [provider]    Allergies: Allergies  Allergen Reactions  . Naproxen Sodium Swelling  . Sulfa Antibiotics     Other reaction(s): Other (See Comments) Eye drop only, can take oral    Review of Systems: Review of Systems  Constitutional: Negative for chills and fever.  Respiratory: Negative for  shortness of breath.   Cardiovascular: Negative for chest pain.  Gastrointestinal: Positive for abdominal pain. Negative for constipation, diarrhea, nausea and vomiting.       Decreased appetite    Physical Exam BP 139/75   Pulse 78   Temp 97.8 F (36.6 C) (Oral)   Ht 5\' 7"  (1.702 m)   Wt 121.1 kg (267 lb)   BMI 41.82 kg/m  CONSTITUTIONAL: No acute distress RESPIRATORY:  Lungs are clear, and breath sounds are equal bilaterally. Normal respiratory effort without pathologic use of accessory muscles. CARDIOVASCULAR: Heart is regular without  murmurs, gallops, or rubs. GI: The abdomen is soft, obese, nondistended, with mild discomfort to palpation in the right lower quadrant.  No peritoneal signs.  MUSCULOSKELETAL:  Normal muscle strength and tone in all four extremities.  No peripheral edema or cyanosis. SKIN: Skin turgor is normal. There are no pathologic skin lesions.  NEUROLOGIC:  Motor and sensation is grossly normal.  Cranial nerves are grossly intact. PSYCH:  Alert and oriented to person, place and time. Affect is normal.  Labs/Imaging: Pathology report 2/21: A. COLON POLYP, CECUM; HOT SNARE:  - TUBULAR ADENOMA.  - NEGATIVE FOR HIGH GRADE DYSPLASIA AND MALIGNANCY.  Assessment and Plan: This is a 67 y.o. female who presents for follow-up from a ruptured appendicitis requiring interventional radiology drainage.  Discussed with the patient now she has had a colonoscopy, we can schedule her for laparoscopic appendectomy.  Currently given that she is having mild symptom recurrence, will start her on a course of antibiotics which would be the same was that she was discharged to home with particularly cefuroxime and Flagyl.  She will have a prescription for a 10-day course.  Then we will schedule her surgery for 3/19 for laparoscopic appendectomy.  Discussed with the patient the risks of bleeding, infection, injury to surrounding structures.  Discussed also that if the appendix base is significantly inflamed, we may have to remove his liver of cecum as well and she is also understanding that we may need to do more extensive resection if the inflammation is very significant.  She is willing to do this and understands the risks of this as well.  If there is any abscess remnant, would also leave a drain and she understands this too.  She also knows to stop her aspirin and Mobic 3 days prior to her surgery.  Face-to-face time spent with the patient and care providers was 25 minutes, with more than 50% of the time spent counseling,  educating, and coordinating care of the patient.     Melvyn Neth, [place]

## 2018-01-17 ENCOUNTER — Telehealth: Payer: Self-pay | Admitting: Surgery

## 2018-01-17 ENCOUNTER — Other Ambulatory Visit: Payer: Self-pay

## 2018-01-17 ENCOUNTER — Encounter
Admission: RE | Admit: 2018-01-17 | Discharge: 2018-01-17 | Disposition: A | Discharge status: Home / Self Care | Payer: Medicare Other | Source: Ambulatory Visit | Attending: Surgery | Admitting: Surgery

## 2018-01-17 DIAGNOSIS — Z01812 Encounter for preprocedural laboratory examination: Secondary | ICD-10-CM | POA: Diagnosis present

## 2018-01-17 DIAGNOSIS — Z01810 Encounter for preprocedural cardiovascular examination: Secondary | ICD-10-CM | POA: Insufficient documentation

## 2018-01-17 HISTORY — DX: Gastro-esophageal reflux disease without esophagitis: K21.9

## 2018-01-17 HISTORY — DX: Adverse effect of unspecified anesthetic, initial encounter: T41.45XA

## 2018-01-17 HISTORY — DX: Anxiety disorder, unspecified: F41.9

## 2018-01-17 HISTORY — DX: Hypothyroidism, unspecified: E03.9

## 2018-01-17 HISTORY — DX: Other complications of anesthesia, initial encounter: T88.59XA

## 2018-01-17 HISTORY — DX: Sleep apnea, unspecified: G47.30

## 2018-01-17 LAB — CBC
HCT: 39.5 % (ref 35.0–47.0)
Hemoglobin: 13.2 g/dL (ref 12.0–16.0)
MCH: 31.7 pg (ref 26.0–34.0)
MCHC: 33.4 g/dL (ref 32.0–36.0)
MCV: 94.9 fL (ref 80.0–100.0)
PLATELETS: 192 10*3/uL (ref 150–440)
RBC: 4.16 MIL/uL (ref 3.80–5.20)
RDW: 15.1 % — ABNORMAL HIGH (ref 11.5–14.5)
WBC: 4.4 10*3/uL (ref 3.6–11.0)

## 2018-01-17 NOTE — Patient Instructions (Signed)
Your procedure is scheduled on: Tuesday 01/24/18 Report to [facility]. To find out your arrival time please call [phone] between 1PM - 3PM on Monday 01/23/18.  Remember: Instructions that are not followed completely may result in serious medical risk, up to and including death, or upon the discretion of your surgeon and anesthesiologist your surgery may need to be rescheduled.     _X__ 1. Do not eat food after midnight the night before your procedure.                 No gum chewing or hard candies. You may drink clear liquids up to 2 hours                 before you are scheduled to arrive for your surgery- DO not drink clear                 liquids within 2 hours of the start of your surgery.                 Clear Liquids include:  water, apple juice without pulp, clear carbohydrate                 drink such as Clearfast or Gatorade, Black Coffee or Tea (Do not add                 anything to coffee or tea).  __X__2.  On the morning of surgery brush your teeth with toothpaste and water, you                 may rinse your mouth with mouthwash if you wish.  Do not swallow any              toothpaste of mouthwash.     _X__ 3.  No Alcohol for 24 hours before or after surgery.   _X__ 4.  Do Not Smoke or use e-cigarettes For 24 Hours Prior to Your Surgery.                 Do not use any chewable tobacco products for at least 6 hours prior to                 surgery.  ____  5.  Bring all medications with you on the day of surgery if instructed.   __X__  6.  Notify your doctor if there is any change in your medical condition      (cold, fever, infections).     Do not wear jewelry, make-up, hairpins, clips or nail polish. Do not wear lotions, powders, or perfumes.  Do not shave 48 hours prior to surgery. Men may shave face and neck. Do not bring valuables to the hospital.    [facility] is not responsible for any belongings or  valuables.  Contacts, dentures/partials or body piercings may not be worn into surgery. Bring a case for your contacts, glasses or hearing aids, a denture cup will be supplied. Leave your suitcase in the car. After surgery it may be brought to your room. For patients admitted to the hospital, discharge time is determined by your treatment team.   Patients discharged the day of surgery will not be allowed to drive home.   Please read over the following fact sheets that you were given:   MRSA Information  __X__ Take these medicines the morning of surgery with A SIP OF WATER:  1. Nexium  2.   3.   4.  5.  6.  ____ Fleet Enema (as directed)   __X__ Use CHG Soap/SAGE wipes as directed  ____ Use inhalers on the day of surgery  ____ Stop metformin/Janumet/Farxiga 2 days prior to surgery    ____ Take 1/2 of usual insulin dose the night before surgery. No insulin the morning          of surgery.   __X__ Stop Blood Thinners Coumadin/Plavix/Xarelto/Pleta/Pradaxa/Eliquis/Effient/Aspirin AS INSTRUCTED BY DR Hampton Abbot  Or contact your Surgeon, Cardiologist or Medical Doctor regarding  ability to stop your blood thinners  __X__ Stop Anti-inflammatories 7 days before surgery such as Advil, Ibuprofen, Motrin,  BC or Goodies Powder, Naprosyn, Naproxen, Aleve, Aspirin    __X__ Stop all herbal supplements, fish oil or vitamin E until after surgery.    ____ Bring C-Pap to the hospital.

## 2018-01-17 NOTE — Telephone Encounter (Signed)
Pt advised of pre op date/time and sx date. Sx: 01/24/18 with Dr Piscoya-laparoscopic appendectomy.  Pre op: 01/17/18 @ 12:45 pm--office interview.   Patient made aware to call [phone], between 1-3:00pm the day before surgery, to find out what time to arrive.

## 2018-01-19 ENCOUNTER — Telehealth: Payer: Self-pay | Admitting: General Practice

## 2018-01-19 MED ORDER — FLUCONAZOLE 50 MG PO TABS: 50.0000 mg | ORAL_TABLET | Freq: Every day | ORAL | 0 refills | Status: DC

## 2018-01-19 NOTE — Telephone Encounter (Signed)
Patients calling said she was put on some antibiotics and now she has a yeast infection, [facility]. Please call patient and advise.

## 2018-01-19 NOTE — Telephone Encounter (Signed)
Returned call to patient at this time. I advised her that I have sent in Diflucan for her yeast infection. She verbalized understanding.

## 2018-01-23 MED ORDER — SODIUM CHLORIDE 0.9 % IV SOLN
2.0000 g | INTRAVENOUS | Status: AC
  Administered 2018-01-24: 2 g via INTRAVENOUS
  Filled 2018-01-23: qty 2

## 2018-01-24 ENCOUNTER — Encounter: Payer: Self-pay | Admitting: *Deleted

## 2018-01-24 ENCOUNTER — Ambulatory Visit: Payer: Medicare Other | Admitting: Certified Registered"

## 2018-01-24 ENCOUNTER — Ambulatory Visit
Admission: RE | Admit: 2018-01-24 | Discharge: 2018-01-24 | Disposition: A | Discharge status: Home / Self Care | Payer: Medicare Other | Source: Ambulatory Visit | Attending: Surgery | Admitting: Surgery

## 2018-01-24 ENCOUNTER — Encounter
Admission: RE | Disposition: A | Discharge status: Home / Self Care | Payer: Self-pay | Source: Ambulatory Visit | Attending: Surgery

## 2018-01-24 DIAGNOSIS — Z96653 Presence of artificial knee joint, bilateral: Secondary | ICD-10-CM | POA: Insufficient documentation

## 2018-01-24 DIAGNOSIS — Z7982 Long term (current) use of aspirin: Secondary | ICD-10-CM | POA: Insufficient documentation

## 2018-01-24 DIAGNOSIS — Z7951 Long term (current) use of inhaled steroids: Secondary | ICD-10-CM | POA: Diagnosis not present

## 2018-01-24 DIAGNOSIS — Z6841 Body Mass Index (BMI) 40.0 and over, adult: Secondary | ICD-10-CM | POA: Insufficient documentation

## 2018-01-24 DIAGNOSIS — G4730 Sleep apnea, unspecified: Secondary | ICD-10-CM | POA: Insufficient documentation

## 2018-01-24 DIAGNOSIS — Z79899 Other long term (current) drug therapy: Secondary | ICD-10-CM | POA: Diagnosis not present

## 2018-01-24 DIAGNOSIS — F419 Anxiety disorder, unspecified: Secondary | ICD-10-CM | POA: Diagnosis not present

## 2018-01-24 DIAGNOSIS — N9489 Other specified conditions associated with female genital organs and menstrual cycle: Secondary | ICD-10-CM | POA: Insufficient documentation

## 2018-01-24 DIAGNOSIS — Z96649 Presence of unspecified artificial hip joint: Secondary | ICD-10-CM | POA: Diagnosis not present

## 2018-01-24 DIAGNOSIS — K3532 Acute appendicitis with perforation and localized peritonitis, without abscess: Secondary | ICD-10-CM | POA: Insufficient documentation

## 2018-01-24 DIAGNOSIS — K3533 Acute appendicitis with perforation and localized peritonitis, with abscess: Secondary | ICD-10-CM | POA: Diagnosis present

## 2018-01-24 DIAGNOSIS — Z791 Long term (current) use of non-steroidal anti-inflammatories (NSAID): Secondary | ICD-10-CM | POA: Diagnosis not present

## 2018-01-24 DIAGNOSIS — K219 Gastro-esophageal reflux disease without esophagitis: Secondary | ICD-10-CM | POA: Diagnosis not present

## 2018-01-24 HISTORY — PX: LAPAROSCOPIC APPENDECTOMY: SHX408

## 2018-01-24 SURGERY — APPENDECTOMY, LAPAROSCOPIC
Anesthesia: General | Wound class: Clean Contaminated

## 2018-01-24 MED ORDER — PHENYLEPHRINE HCL 10 MG/ML IJ SOLN
INTRAMUSCULAR | Status: AC
  Filled 2018-01-24: qty 1

## 2018-01-24 MED ORDER — SUCCINYLCHOLINE CHLORIDE 20 MG/ML IJ SOLN
INTRAMUSCULAR | Status: DC | PRN
  Administered 2018-01-24: 100 mg via INTRAVENOUS

## 2018-01-24 MED ORDER — GABAPENTIN 300 MG PO CAPS
300.0000 mg | ORAL_CAPSULE | ORAL | Status: AC
  Administered 2018-01-24: 300 mg via ORAL

## 2018-01-24 MED ORDER — ONDANSETRON HCL 4 MG/2ML IJ SOLN: 4.0000 mg | Freq: Once | INTRAMUSCULAR | Status: DC | PRN

## 2018-01-24 MED ORDER — OXYCODONE HCL 5 MG PO TABS
5.0000 mg | ORAL_TABLET | Freq: Four times a day (QID) | ORAL | Status: AC | PRN
  Administered 2018-01-24: 5 mg via ORAL

## 2018-01-24 MED ORDER — BUPIVACAINE-EPINEPHRINE (PF) 0.5% -1:200000 IJ SOLN
INTRAMUSCULAR | Status: DC | PRN
  Administered 2018-01-24: 30 mL via PERINEURAL

## 2018-01-24 MED ORDER — SUGAMMADEX SODIUM 500 MG/5ML IV SOLN
INTRAVENOUS | Status: AC
  Filled 2018-01-24: qty 5

## 2018-01-24 MED ORDER — ACETAMINOPHEN 500 MG PO TABS
1000.0000 mg | ORAL_TABLET | ORAL | Status: AC
  Administered 2018-01-24: 1000 mg via ORAL

## 2018-01-24 MED ORDER — ROCURONIUM BROMIDE 50 MG/5ML IV SOLN
INTRAVENOUS | Status: AC
  Filled 2018-01-24: qty 1

## 2018-01-24 MED ORDER — GABAPENTIN 300 MG PO CAPS
ORAL_CAPSULE | ORAL | Status: AC
  Administered 2018-01-24: 300 mg via ORAL
  Filled 2018-01-24: qty 1

## 2018-01-24 MED ORDER — LIDOCAINE HCL (CARDIAC) 20 MG/ML IV SOLN
INTRAVENOUS | Status: DC | PRN
  Administered 2018-01-24: 50 mg via INTRAVENOUS

## 2018-01-24 MED ORDER — GLYCOPYRROLATE 0.2 MG/ML IJ SOLN
INTRAMUSCULAR | Status: AC
Start: 2018-01-24 — End: 2018-01-24
  Filled 2018-01-24: qty 1

## 2018-01-24 MED ORDER — MIDAZOLAM HCL 2 MG/2ML IJ SOLN
INTRAMUSCULAR | Status: AC
  Filled 2018-01-24: qty 2

## 2018-01-24 MED ORDER — SUCCINYLCHOLINE CHLORIDE 20 MG/ML IJ SOLN
INTRAMUSCULAR | Status: AC
Start: 2018-01-24 — End: 2018-01-24
  Filled 2018-01-24: qty 1

## 2018-01-24 MED ORDER — DIPHENHYDRAMINE HCL 50 MG/ML IJ SOLN
INTRAMUSCULAR | Status: AC
Start: 2018-01-24 — End: 2018-01-24
  Filled 2018-01-24: qty 1

## 2018-01-24 MED ORDER — ROCURONIUM BROMIDE 100 MG/10ML IV SOLN
INTRAVENOUS | Status: DC | PRN
  Administered 2018-01-24: 30 mg via INTRAVENOUS
  Administered 2018-01-24 (×2): 10 mg via INTRAVENOUS

## 2018-01-24 MED ORDER — MIDAZOLAM HCL 2 MG/2ML IJ SOLN
INTRAMUSCULAR | Status: DC | PRN
  Administered 2018-01-24: 2 mg via INTRAVENOUS

## 2018-01-24 MED ORDER — ACETAMINOPHEN 500 MG PO TABS
ORAL_TABLET | ORAL | Status: AC
  Administered 2018-01-24: 1000 mg via ORAL
  Filled 2018-01-24: qty 2

## 2018-01-24 MED ORDER — DIPHENHYDRAMINE HCL 50 MG/ML IJ SOLN
INTRAMUSCULAR | Status: DC | PRN
  Administered 2018-01-24: 25 mg via INTRAVENOUS

## 2018-01-24 MED ORDER — PROPOFOL 10 MG/ML IV BOLUS
INTRAVENOUS | Status: DC | PRN
  Administered 2018-01-24: 100 mg via INTRAVENOUS

## 2018-01-24 MED ORDER — PHENYLEPHRINE HCL 10 MG/ML IJ SOLN
INTRAMUSCULAR | Status: DC | PRN
  Administered 2018-01-24 (×2): 100 ug via INTRAVENOUS
  Administered 2018-01-24: 50 ug via INTRAVENOUS

## 2018-01-24 MED ORDER — PROPOFOL 10 MG/ML IV BOLUS
INTRAVENOUS | Status: AC
  Filled 2018-01-24: qty 20

## 2018-01-24 MED ORDER — FENTANYL CITRATE (PF) 250 MCG/5ML IJ SOLN
INTRAMUSCULAR | Status: AC
  Filled 2018-01-24: qty 5

## 2018-01-24 MED ORDER — DEXAMETHASONE SODIUM PHOSPHATE 10 MG/ML IJ SOLN
INTRAMUSCULAR | Status: AC
  Filled 2018-01-24: qty 1

## 2018-01-24 MED ORDER — SUGAMMADEX SODIUM 200 MG/2ML IV SOLN
INTRAVENOUS | Status: DC | PRN
  Administered 2018-01-24: 240 mg via INTRAVENOUS

## 2018-01-24 MED ORDER — ONDANSETRON HCL 4 MG/2ML IJ SOLN
INTRAMUSCULAR | Status: DC | PRN
  Administered 2018-01-24: 4 mg via INTRAVENOUS

## 2018-01-24 MED ORDER — ONDANSETRON HCL 4 MG/2ML IJ SOLN
INTRAMUSCULAR | Status: AC
  Filled 2018-01-24: qty 2

## 2018-01-24 MED ORDER — DEXAMETHASONE SODIUM PHOSPHATE 10 MG/ML IJ SOLN
INTRAMUSCULAR | Status: DC | PRN
  Administered 2018-01-24: 10 mg via INTRAVENOUS

## 2018-01-24 MED ORDER — CHLORHEXIDINE GLUCONATE CLOTH 2 % EX PADS: 6.0000 | MEDICATED_PAD | Freq: Once | CUTANEOUS | Status: DC

## 2018-01-24 MED ORDER — FENTANYL CITRATE (PF) 100 MCG/2ML IJ SOLN: 25.0000 ug | INTRAMUSCULAR | Status: DC | PRN

## 2018-01-24 MED ORDER — FENTANYL CITRATE (PF) 100 MCG/2ML IJ SOLN
INTRAMUSCULAR | Status: DC | PRN
  Administered 2018-01-24 (×5): 50 ug via INTRAVENOUS

## 2018-01-24 MED ORDER — CHLORHEXIDINE GLUCONATE CLOTH 2 % EX PADS
6.0000 | MEDICATED_PAD | Freq: Once | CUTANEOUS | Status: AC
  Administered 2018-01-24: 6 via TOPICAL

## 2018-01-24 MED ORDER — GLYCOPYRROLATE 0.2 MG/ML IJ SOLN
INTRAMUSCULAR | Status: DC | PRN
  Administered 2018-01-24: 0.2 mg via INTRAVENOUS

## 2018-01-24 MED ORDER — OXYCODONE HCL 5 MG PO TABS
ORAL_TABLET | ORAL | Status: AC
  Filled 2018-01-24: qty 1

## 2018-01-24 MED ORDER — LACTATED RINGERS IV SOLN
INTRAVENOUS | Status: DC
  Administered 2018-01-24: 08:00:00 via INTRAVENOUS

## 2018-01-24 MED ORDER — LIDOCAINE HCL (PF) 2 % IJ SOLN
INTRAMUSCULAR | Status: AC
  Filled 2018-01-24: qty 10

## 2018-01-24 MED ORDER — OXYCODONE HCL 5 MG PO TABS: 5.0000 mg | ORAL_TABLET | Freq: Four times a day (QID) | ORAL | 0 refills | Status: DC | PRN

## 2018-01-24 SURGICAL SUPPLY — 46 items
ADH SKN CLS APL DERMABOND .7 (GAUZE/BANDAGES/DRESSINGS) ×1
APL SRG 38 LTWT LNG FL B (MISCELLANEOUS) ×1
APPLICATOR ARISTA FLEXITIP XL (MISCELLANEOUS) ×2 IMPLANT
BAG SPEC RTRVL LRG 6X4 10 (ENDOMECHANICALS) ×1
CANISTER SUCT 1200ML W/VALVE (MISCELLANEOUS) ×3 IMPLANT
CHLORAPREP W/TINT 26ML (MISCELLANEOUS) ×3 IMPLANT
CUTTER FLEX LINEAR 45M (STAPLE) IMPLANT
DERMABOND ADVANCED (GAUZE/BANDAGES/DRESSINGS) ×2
DERMABOND ADVANCED .7 DNX12 (GAUZE/BANDAGES/DRESSINGS) ×1 IMPLANT
ELECT CAUTERY BLADE 6.4 (BLADE) ×3 IMPLANT
ELECT REM PT RETURN 9FT ADLT (ELECTROSURGICAL) ×3
ELECTRODE REM PT RTRN 9FT ADLT (ELECTROSURGICAL) ×1 IMPLANT
GLOVE SURG SYN 7.0 (GLOVE) ×3 IMPLANT
GLOVE SURG SYN 7.0 PF PI (GLOVE) ×1 IMPLANT
GLOVE SURG SYN 7.5  E (GLOVE) ×2
GLOVE SURG SYN 7.5 E (GLOVE) ×1 IMPLANT
GLOVE SURG SYN 7.5 PF PI (GLOVE) ×1 IMPLANT
GOWN STRL REUS W/ TWL LRG LVL3 (GOWN DISPOSABLE) ×2 IMPLANT
GOWN STRL REUS W/TWL LRG LVL3 (GOWN DISPOSABLE) ×6
HEMOSTAT ARISTA ABSORB 1G (MISCELLANEOUS) ×2 IMPLANT
IRRIGATION STRYKERFLOW (MISCELLANEOUS) ×1 IMPLANT
IRRIGATOR STRYKERFLOW (MISCELLANEOUS) ×3
IV NS 1000ML (IV SOLUTION) ×3
IV NS 1000ML BAXH (IV SOLUTION) ×1 IMPLANT
KIT TURNOVER KIT A (KITS) ×3 IMPLANT
LABEL OR SOLS (LABEL) ×3 IMPLANT
LIGASURE LAP MARYLAND 5MM 37CM (ELECTROSURGICAL) ×3 IMPLANT
NEEDLE HYPO 22GX1.5 SAFETY (NEEDLE) ×3 IMPLANT
NS IRRIG 500ML POUR BTL (IV SOLUTION) ×3 IMPLANT
PACK LAP CHOLECYSTECTOMY (MISCELLANEOUS) ×3 IMPLANT
PENCIL ELECTRO HAND CTR (MISCELLANEOUS) ×3 IMPLANT
POUCH SPECIMEN RETRIEVAL 10MM (ENDOMECHANICALS) ×3 IMPLANT
RELOAD 45 VASCULAR/THIN (ENDOMECHANICALS) IMPLANT
RELOAD STAPLE 45 2.5 WHT GRN (ENDOMECHANICALS) IMPLANT
RELOAD STAPLE 45 3.5 BLU ETS (ENDOMECHANICALS) ×1 IMPLANT
RELOAD STAPLE TA45 3.5 REG BLU (ENDOMECHANICALS) ×3 IMPLANT
SCISSORS METZENBAUM CVD 33 (INSTRUMENTS) ×3 IMPLANT
SLEEVE ADV FIXATION 5X100MM (TROCAR) ×6 IMPLANT
SUT MNCRL 4-0 (SUTURE) ×3
SUT MNCRL 4-0 27XMFL (SUTURE) ×1
SUT VICRYL 0 AB UR-6 (SUTURE) ×3 IMPLANT
SUTURE MNCRL 4-0 27XMF (SUTURE) ×1 IMPLANT
TRAY FOLEY W/METER SILVER 16FR (SET/KITS/TRAYS/PACK) ×3 IMPLANT
TROCAR BALLN GELPORT 12X130M (ENDOMECHANICALS) ×3 IMPLANT
TROCAR Z-THREAD OPTICAL 5X100M (TROCAR) ×3 IMPLANT
TUBING INSUFFLATION (TUBING) ×3 IMPLANT

## 2018-01-24 NOTE — Progress Notes (Signed)
Dr. Hampton Abbot at bedside, patient coughing, wound To her belly button is open.  Re-enforced with gauze.

## 2018-01-24 NOTE — Anesthesia Procedure Notes (Signed)
Procedure Name: Intubation Performed by: Rolla Plate, CRNA Pre-anesthesia Checklist: Patient identified, Patient being monitored, Timeout performed, Emergency Drugs available and Suction available Patient Re-evaluated:Patient Re-evaluated prior to induction Oxygen Delivery Method: Circle system utilized Preoxygenation: Pre-oxygenation with 100% oxygen Induction Type: IV induction and Rapid sequence Ventilation: Mask ventilation without difficulty Laryngoscope Size: 3 and McGraph Grade View: Grade I Tube type: Oral Tube size: 7.0 mm Number of attempts: 1 Airway Equipment and Method: Stylet Placement Confirmation: ETT inserted through vocal cords under direct vision,  positive ETCO2 and breath sounds checked- equal and bilateral Secured at: 21 cm Tube secured with: Tape Dental Injury: Teeth and Oropharynx as per pre-operative assessment  Difficulty Due To: Difficulty was anticipated, Difficult Airway- due to dentition and Difficult Airway- due to reduced neck mobility Future Recommendations: Recommend- induction with short-acting agent, and alternative techniques readily available

## 2018-01-24 NOTE — Transfer of Care (Signed)
Immediate Anesthesia Transfer of Care Note  Patient: Kelsey Woods  Procedure(s) Performed: APPENDECTOMY LAPAROSCOPIC (N/A )  Patient Location: PACU  Anesthesia Type:General  Level of Consciousness: awake, alert  and oriented  Airway & Oxygen Therapy: Patient Spontanous Breathing  Post-op Assessment: Report given to RN and Post -op Vital signs reviewed and stable  Post vital signs: Reviewed and stable  Last Vitals:  Vitals:   01/24/18 0809 01/24/18 1111  BP: 135/73 113/73  Pulse: 78 96  Resp: 16 19  Temp: 36.5 C 36.5 C  SpO2: 97% 98%    Last Pain:  Vitals:   01/24/18 0809  TempSrc: Tympanic         Complications: No apparent anesthesia complications

## 2018-01-24 NOTE — Interval H&P Note (Signed)
History and Physical Interval Note:  01/24/2018 8:41 AM  Kelsey Woods  has presented today for surgery, with the diagnosis of ACUTE APPENDICITIS  The various methods of treatment have been discussed with the patient and family. After consideration of risks, benefits and other options for treatment, the patient has consented to  Procedure(s): APPENDECTOMY LAPAROSCOPIC (N/A) as a surgical intervention .  The patient's history has been reviewed, patient examined, no change in status, stable for surgery.  I have reviewed the patient's chart and labs.  Questions were answered to the patient's satisfaction.     Taja Pentland

## 2018-01-24 NOTE — Anesthesia Post-op Follow-up Note (Signed)
Anesthesia QCDR form completed.        

## 2018-01-24 NOTE — Op Note (Signed)
  Procedure Date:  01/24/2018  Pre-operative Diagnosis:  Ruptured appendicitis  Post-operative Diagnosis:  Ruptured appendicitis  Procedure:  Laparoscopic appendectomy  Surgeon:  Melvyn Neth, MD  Anesthesia:  General endotracheal  Estimated Blood Loss:  25 ml  Specimens:  appendix  Complications:  None  Indications for Procedure:  This is a 67 y.o. female who presented in January 2019 with abdominal pain and workup revealing perforated appendicitis with abscess.  This was treated with percutaneous drainage and now presents for appendectomy. The risks of bleeding, infection, recurrence of symptoms, negative laparoscopy, potential for an open procedure, bowel injury, abscess or infection, were all discussed with the patient and she was willing to proceed.  Description of Procedure: The patient was correctly identified in the preoperative area and brought into the operating room.  The patient was placed supine with VTE prophylaxis in place.  Appropriate time-outs were performed.  Anesthesia was induced and the patient was intubated.  Foley catheter was placed.  Appropriate antibiotics were infused.  The abdomen was prepped and draped in a sterile fashion. An infraumbilical incision was made. A cutdown technique was used to enter the abdominal cavity without injury, and a Hasson trocar was inserted.  Pneumoperitoneum was obtained with appropriate opening pressures.  Two 5-mm ports were placed in the suprapubic and left lateral positions under direct visualization.  The right lower quadrant was inspected.  There were adhesions from the colon to the right abdominal wall.  The appendix was identified and the tip was scarred down to the low lateral abdominal wall.  The appendix was carefully dissected off the abdominal wall and adhesions to the terminal ileum without injury.  The base of the appendix was dissected out and noted to be healthy.  It was divided with a standard load Endo GIA. The  mesoappendix was divided using the LigaSure.  The appendix was placed in an Endocatch bag and brought out through the umbilical incision.  There was mild oozing from raw surfaces in the dissection field.  The area was thoroughly irrigated.  Hemostasis was achieved with Arista without issues. The right lower quadrant was then inspected again revealing an intact staple line, no bleeding, and no bowel injury.    The 5 mm ports were removed under direct visualization and the Hasson trocar was removed.  The fascial opening was closed using 0 vicryl suture.  Local anesthetic was infused in all incisions and the incisions were closed with 4-0 Monocryl.  The wounds were cleaned and sealed with DermaBond.  Foley catheter was removed and the patient was emerged from anesthesia and extubated and brought to the recovery room for further management.  The patient tolerated the procedure well and all counts were correct at the end of the case.   Melvyn Neth, MD

## 2018-01-24 NOTE — Progress Notes (Signed)
01/24/18  Patient seen in PACU after arrival from operating room.  Her umbilical incision was oozing serosanguinous fluid.  Upon applying pressure with gauze, the umbilical incision opened up.  Uncertain if the Monocryl suture broke, but her skin had opened the full length of the wound.  Discussed with patient that at that point, we would be unable to close the wound again due to risk of infection.  Applied dry gauze dressing to pack the wound and covered with dry gauze and tape.  Gave instructions to patient and her husband for dressing changes.  She will follow up next week in office for wound check.   Olean Ree, MD

## 2018-01-24 NOTE — Anesthesia Preprocedure Evaluation (Signed)
Anesthesia Evaluation  Patient identified by MRN, date of birth, ID band Patient awake    Reviewed: Allergy & Precautions, H&P , NPO status , Patient's Chart, lab work & pertinent test results, reviewed documented beta blocker date and time   History of Anesthesia Complications Negative for: history of anesthetic complications  Airway Mallampati: II  TM Distance: >3 FB Neck ROM: full    Dental  (+) Caps, Dental Advidsory Given, Missing   Pulmonary neg shortness of breath, sleep apnea , neg COPD, neg recent URI,           Cardiovascular Exercise Tolerance: Good negative cardio ROS       Neuro/Psych PSYCHIATRIC DISORDERS Anxiety negative neurological ROS     GI/Hepatic Neg liver ROS, GERD  ,  Endo/Other  neg diabetesHypothyroidism Morbid obesity  Renal/GU CRFRenal disease  negative genitourinary   Musculoskeletal   Abdominal   Peds  Hematology negative hematology ROS (+)   Anesthesia Other Findings Past Medical History: No date: Anxiety No date: Arthritis No date: Complication of anesthesia     Comment:  swelling of lips after gallblader surgery No date: GERD (gastroesophageal reflux disease) No date: Hypothyroidism No date: Sleep apnea   Reproductive/Obstetrics negative OB ROS                             Anesthesia Physical Anesthesia Plan  ASA: III  Anesthesia Plan: General   Post-op Pain Management:    Induction: Intravenous  PONV Risk Score and Plan: 3 and Ondansetron and Dexamethasone  Airway Management Planned: Oral ETT  Additional Equipment:   Intra-op Plan:   Post-operative Plan: Extubation in OR  Informed Consent: I have reviewed the patients History and Physical, chart, labs and discussed the procedure including the risks, benefits and alternatives for the proposed anesthesia with the patient or authorized representative who has indicated his/her understanding  and acceptance.   Dental Advisory Given  Plan Discussed with: Anesthesiologist, CRNA and Surgeon  Anesthesia Plan Comments:         Anesthesia Quick Evaluation

## 2018-01-24 NOTE — Interval H&P Note (Signed)
History and Physical Interval Note:  01/24/2018 8:42 AM  [name]  has presented today for surgery, with the diagnosis of ACUTE APPENDICITIS  The various methods of treatment have been discussed with the patient and family. After consideration of risks, benefits and other options for treatment, the patient has consented to  Procedure(s): APPENDECTOMY LAPAROSCOPIC (N/A) as a surgical intervention .  The patient's history has been reviewed, patient examined, no change in status, stable for surgery.  I have reviewed the patient's chart and labs.  Questions were answered to the patient's satisfaction.  Discussed with patient that depending on any degree of inflammation left at the appendix, it may be that we have to take a sliver of cecum as well with the appendix.  Do not expect to have to do more extensive surgery such as a right colectomy, but did discuss this low possibility with the patient and she agrees.   Arlyn Buerkle

## 2018-01-24 NOTE — Progress Notes (Signed)
Dr. Hampton Abbot into see and check

## 2018-01-25 NOTE — Anesthesia Postprocedure Evaluation (Signed)
Anesthesia Post Note  Patient: Kelsey Woods  Procedure(s) Performed: APPENDECTOMY LAPAROSCOPIC (N/A )  Patient location during evaluation: PACU Anesthesia Type: General Level of consciousness: awake and alert Pain management: pain level controlled Vital Signs Assessment: post-procedure vital signs reviewed and stable Respiratory status: spontaneous breathing, nonlabored ventilation, respiratory function stable and patient connected to nasal cannula oxygen Cardiovascular status: blood pressure returned to baseline and stable Postop Assessment: no apparent nausea or vomiting Anesthetic complications: no     Last Vitals:  Vitals:   01/24/18 1143 01/24/18 1222  BP: 138/77 131/72  Pulse: 73 (!) 55  Resp: 16   Temp: (!) 36 C   SpO2: 100% 97%    Last Pain:  Vitals:   01/24/18 1222  TempSrc:   PainSc: 4                  Martha Clan

## 2018-01-26 LAB — SURGICAL PATHOLOGY

## 2018-02-01 ENCOUNTER — Ambulatory Visit (INDEPENDENT_AMBULATORY_CARE_PROVIDER_SITE_OTHER): Payer: Medicare Other | Admitting: Surgery

## 2018-02-01 ENCOUNTER — Encounter: Payer: Self-pay | Admitting: Surgery

## 2018-02-01 DIAGNOSIS — Z09 Encounter for follow-up examination after completed treatment for conditions other than malignant neoplasm: Secondary | ICD-10-CM

## 2018-02-02 ENCOUNTER — Encounter: Payer: Self-pay | Admitting: Surgery

## 2018-02-02 NOTE — Progress Notes (Signed)
S/p lap appy 3/19 Dr. Hampton Abbot Doing well except dehiscence of periumbilical incision Path d/w pt  She is doing daily dressing changes + PO, no fevers  PE NAD Abd: soft, umbilical wound good granulation tissue, no infection. Packed. Other incisions c/d/i, no infection or peritonitis  A/P Doing well Continue daily packing of wound RTC 1-2 weeks w Dr. Hampton Abbot for wound check

## 2018-02-08 ENCOUNTER — Ambulatory Visit: Payer: Medicare Other | Admitting: Surgery

## 2018-02-09 ENCOUNTER — Ambulatory Visit (INDEPENDENT_AMBULATORY_CARE_PROVIDER_SITE_OTHER): Payer: Medicare Other | Admitting: Surgery

## 2018-02-09 ENCOUNTER — Encounter: Payer: Self-pay | Admitting: Surgery

## 2018-02-09 VITALS — BP 130/86 | HR 101 | Temp 98.1°F | Ht 67.0 in | Wt 265.6 lb

## 2018-02-09 DIAGNOSIS — K3533 Acute appendicitis with perforation and localized peritonitis, with abscess: Secondary | ICD-10-CM

## 2018-02-09 DIAGNOSIS — Z4889 Encounter for other specified surgical aftercare: Secondary | ICD-10-CM

## 2018-02-09 NOTE — Progress Notes (Signed)
Surgical Clinic Progress/Follow-up Note   HPI:  67 y.o. Female presents to clinic for post-op follow-up umbilical wound evaluation. Patient reports she feels well and has been continuing daily moist-to-dry dressing changes, denies pain, fever/chills, N/V, CP, or SOB while tolerating regular diet with +flatus and +BM's WNL.  Review of Systems:  Constitutional: denies any other weight loss, fever, chills, or sweats  Eyes: denies any other vision changes, history of eye injury  ENT: denies sore throat, hearing problems  Respiratory: denies shortness of breath, wheezing  Cardiovascular: denies chest pain, palpitations  Gastrointestinal: abdominal pain, N/V, and bowel function as per HPI Musculoskeletal: denies any other joint pains or cramps  Skin: Denies any other rashes or skin discolorations  Neurological: denies any other headache, dizziness, weakness  Psychiatric: denies any other depression, anxiety  All other review of systems: otherwise negative   Vital Signs:  BP 130/86   Pulse (!) 101   Temp 98.1 F (36.7 C) (Oral)   Ht 5\' 7"  (1.702 m)   Wt 265 lb 9.6 oz (120.5 kg)   BMI 41.60 kg/m    Physical Exam:  Constitutional:  -- Obese body habitus  -- Awake, alert, and oriented x3  Eyes:  -- Pupils equally round and reactive to light  -- No scleral icterus  Ear, nose, throat:  -- No jugular venous distension  -- No nasal drainage, bleeding Pulmonary:  -- No crackles -- Equal breath sounds bilaterally -- Breathing non-labored at rest Cardiovascular:  -- S1, S2 present  -- No pericardial rubs  Gastrointestinal:  -- Soft, nontender, non-distended, no guarding/rebound -- Post-surgical incisional scars well-approximated without erythema or drainage except skin umbilical port site skin edges separated with healing granulating subcutaneous fat intact -- No abdominal masses appreciated, pulsatile or otherwise, no evidence of hernia Musculoskeletal / Integumentary:  --  Wounds or skin discoloration: None appreciated except as described above (GI)  -- Extremities: B/L UE and LE FROM, hands and feet warm, no edema  Neurologic:  -- Motor function: intact and symmetric  -- Sensation: intact and symmetric   Assessment:  67 y.o. yo Female with a problem list including...  Patient Active Problem List   Diagnosis Date Noted  . Colon cancer screening   . Acute appendicitis with perforation and peritoneal abscess   . Ruptured appendicitis 11/28/2017  . Bilateral carpal tunnel syndrome 03/17/2017  . 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 Lumbar radiculitis 08/14/2014  . DDD (degenerative disc disease), lumbar 08/14/2014  . Sleep apnea 08/07/2014  . Obesity, unspecified 08/07/2014  . History of hypothyroidism 08/07/2014  . Chronic kidney disease 08/07/2014  . Arthritis 08/07/2014    presents to clinic for post-op follow-up evaluation, doing overall well 2 weeks s/p laparoscopic appendectomy, complicated by non-infected umbilical wound dehiscence in PACU.  Plan:   - continue current management  - okay to continue showering with soap/water, no submerging under water until healed  - return to clinic in 3 weeks, instructed to call office if any questions or concerns  - medical management of comorbidities with home medications  All of the above recommendations were discussed with the patient, and all of patient's questions were answered to her expressed satisfaction.  -- Marilynne Drivers Rosana Hoes, MD, [place]: [facility] Partnering for exceptional care. Office: [phone]

## 2018-02-09 NOTE — Patient Instructions (Signed)
Please continue to apply a dressing over the incision.   Please see your follow up appointment listed below.

## 2018-03-02 ENCOUNTER — Encounter: Payer: Self-pay | Admitting: Surgery

## 2018-03-02 ENCOUNTER — Ambulatory Visit (INDEPENDENT_AMBULATORY_CARE_PROVIDER_SITE_OTHER): Payer: Medicare Other | Admitting: Surgery

## 2018-03-02 VITALS — BP 125/82 | HR 75 | Temp 98.1°F | Ht 67.0 in | Wt 269.0 lb

## 2018-03-02 DIAGNOSIS — Z09 Encounter for follow-up examination after completed treatment for conditions other than malignant neoplasm: Secondary | ICD-10-CM

## 2018-03-02 NOTE — Progress Notes (Signed)
03/02/2018  HPI: Patient is s/p laparoscopic appendectomy on 3/19.  Her umbilical wound had opened up immediately post-op and has been healing by secondary intention.  She reports doing well and tolerating a diet, without any GI issues.  Her umbilical incision is almost healed but she did notice some drainage from the inferior incision.  Vital signs: There were no vitals taken for this visit.   Physical Exam: Constitutional: No acute distress Abdomen:  Soft, nondistended, obese, nontender to palpation.  Umbilical incision has a 5 mm opening with subcutaneous fat lobule protruding.  This was cut off at bedside flush with the skin.  The inferior incision looks like the scab broke off and left a small superficial wound with some serous drainage.  No evidence of infection.  Silver nitrate was applied to both wounds.  Assessment/Plan: 67 yo female s/p lap appy  --may apply band-aid to both wounds once daily as needed.  Reassured patient that these wounds should heal soon.  No evidence of infection. --may follow up prn.   Melvyn Neth, [place]

## 2018-03-02 NOTE — Patient Instructions (Signed)
Pease give [place] a call in case you have any questions or concerns.

## 2018-03-02 NOTE — Addendum Note (Signed)
Addended by: Wayna Chalet on: 03/02/2018 09:42 AM   Modules accepted: Orders

## 2018-04-05 ENCOUNTER — Encounter: Payer: Self-pay | Admitting: Emergency Medicine

## 2018-04-05 ENCOUNTER — Emergency Department
Admission: EM | Admit: 2018-04-05 | Discharge: 2018-04-05 | Disposition: A | Discharge status: Home / Self Care | Payer: Medicare Other | Attending: Student in an Organized Health Care Education/Training Program | Admitting: Student in an Organized Health Care Education/Training Program

## 2018-04-05 ENCOUNTER — Emergency Department: Payer: Medicare Other

## 2018-04-05 ENCOUNTER — Other Ambulatory Visit: Payer: Self-pay

## 2018-04-05 DIAGNOSIS — Z96641 Presence of right artificial hip joint: Secondary | ICD-10-CM | POA: Insufficient documentation

## 2018-04-05 DIAGNOSIS — R42 Dizziness and giddiness: Secondary | ICD-10-CM | POA: Insufficient documentation

## 2018-04-05 DIAGNOSIS — R0789 Other chest pain: Secondary | ICD-10-CM | POA: Diagnosis not present

## 2018-04-05 DIAGNOSIS — Z96652 Presence of left artificial knee joint: Secondary | ICD-10-CM | POA: Insufficient documentation

## 2018-04-05 DIAGNOSIS — Z79899 Other long term (current) drug therapy: Secondary | ICD-10-CM | POA: Diagnosis not present

## 2018-04-05 DIAGNOSIS — Z96651 Presence of right artificial knee joint: Secondary | ICD-10-CM | POA: Diagnosis not present

## 2018-04-05 DIAGNOSIS — Z7982 Long term (current) use of aspirin: Secondary | ICD-10-CM | POA: Insufficient documentation

## 2018-04-05 DIAGNOSIS — E039 Hypothyroidism, unspecified: Secondary | ICD-10-CM | POA: Insufficient documentation

## 2018-04-05 DIAGNOSIS — R079 Chest pain, unspecified: Secondary | ICD-10-CM | POA: Diagnosis present

## 2018-04-05 LAB — BASIC METABOLIC PANEL
Anion gap: 7 (ref 5–15)
BUN: 17 mg/dL (ref 6–20)
CHLORIDE: 104 mmol/L (ref 101–111)
CO2: 26 mmol/L (ref 22–32)
Calcium: 9.1 mg/dL (ref 8.9–10.3)
Creatinine, Ser: 0.79 mg/dL (ref 0.44–1.00)
GFR calc Af Amer: >60 mL/min (ref >60)
GFR calc non Af Amer: >60 mL/min (ref >60)
GLUCOSE: 99 mg/dL (ref 65–99)
POTASSIUM: 4.4 mmol/L (ref 3.5–5.1)
Sodium: 137 mmol/L (ref 135–145)

## 2018-04-05 LAB — CBC
HEMATOCRIT: 41.5 % (ref 35.0–47.0)
Hemoglobin: 14.4 g/dL (ref 12.0–16.0)
MCH: 33.3 pg (ref 26.0–34.0)
MCHC: 34.7 g/dL (ref 32.0–36.0)
MCV: 96 fL (ref 80.0–100.0)
Platelets: 189 10*3/uL (ref 150–440)
RBC: 4.32 MIL/uL (ref 3.80–5.20)
RDW: 13.1 % (ref 11.5–14.5)
WBC: 5.1 10*3/uL (ref 3.6–11.0)

## 2018-04-05 LAB — TROPONIN I
Troponin I: <0.03 ng/mL (ref <0.03)
Troponin I: <0.03 ng/mL (ref <0.03)

## 2018-04-05 NOTE — Discharge Instructions (Addendum)
Your exam, cardiac labs, CXR, and EKG are all normal at this time. We are pleased that your labs don't indicate a cardiac cause for your symptoms. Continue to monitor for worsening of symptoms. Return to the ED as needed. See Dr. Wynetta Emery for routine follow-up.

## 2018-04-05 NOTE — ED Triage Notes (Signed)
Pt to ED from home c/o mid generalized chest pain started last Thursday intermittently that is sharp, denies n/v/d, denies SOB.  States playing with grandchild and thought maybe pulled muscle.  Denies cardiac hx.  No acute distress noted, speaking in complete and coherent sentences.

## 2018-04-05 NOTE — ED Notes (Signed)
See triage note   States she developed pain to upper chest about 1 week ago  States pain is continuous    But has not taken anything for pain  resp even and non tender

## 2018-04-05 NOTE — ED Provider Notes (Signed)
[facility] Emergency Department Provider Note ____________________________________________  Time seen: 1339  I have reviewed the triage vital signs and the nursing notes.  HISTORY  Chief Complaint  Chest Pain  HPI Kelsey Woods is a 67 y.o. female presents to the ED, accompanied by her husband, from [facility].  Patient was referred here for further evaluation of complaints of left-sided and central anterior chest pain.  Patient describes onset of her symptoms on Thursday, noting a sharp pain to the left side of the sternum over the breast.  She reports since that time her symptoms have decreased, but have not resolved completely.  Denies any referral of the pain denies any aggravating or alleviating measures.  She is also had some episodic dizziness and feelings of unease.  She denies any interim fevers, chills, sweats, or nausea.  She also denies any cough, congestion, or shortness of breath.  She denies any significant cardiac history and has taken aspirin daily since the onset of her symptoms.  Past Medical History:  Diagnosis Date  . Acute appendicitis with perforation and peritoneal abscess   . Anxiety   . Arthritis   . Complication of anesthesia    swelling of lips after gallblader surgery  . GERD (gastroesophageal reflux disease)   . Hypothyroidism   . Ruptured appendicitis 11/28/2017  . Sleep apnea     Patient Active Problem List   Diagnosis Date Noted  . Colon cancer screening   . Bilateral carpal tunnel syndrome 03/17/2017  . 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 Lumbar radiculitis 08/14/2014  . DDD (degenerative disc disease), lumbar 08/14/2014  . Sleep apnea 08/07/2014  . Obesity, unspecified 08/07/2014  . History of hypothyroidism 08/07/2014  . Chronic kidney disease 08/07/2014  . Arthritis 08/07/2014    Past Surgical History:  Procedure Laterality Date  . BREAST BIOPSY Right 1980's   Benign  . CESAREAN SECTION    .  CHOLECYSTECTOMY    . COLONOSCOPY WITH PROPOFOL N/A 12/29/2017   Procedure: COLONOSCOPY WITH PROPOFOL;  Surgeon: Lin Landsman, MD;  Location: [facility] ENDOSCOPY;  Service: Gastroenterology;  Laterality: N/A;  . DILATION AND CURETTAGE OF UTERUS    . JOINT REPLACEMENT Bilateral    knees  . JOINT REPLACEMENT Right    hip  . LAPAROSCOPIC APPENDECTOMY N/A 01/24/2018   Procedure: APPENDECTOMY LAPAROSCOPIC;  Surgeon: Olean Ree, MD;  Location: [facility] ORS;  Service: General;  Laterality: N/A;  . NASAL SEPTUM SURGERY    . REPLACEMENT TOTAL KNEE BILATERAL    . TONSILLECTOMY    . TOTAL HIP ARTHROPLASTY      Prior to Admission medications   Medication Sig Start Date End Date Taking? Authorizing Provider  acetaminophen (TYLENOL) 500 MG tablet Take 500 mg by mouth every 4 (four) hours as needed for moderate pain, fever or headache.    [provider]  aspirin EC 81 MG tablet Take 81 mg by mouth daily.    [provider]  cetirizine (ZYRTEC) 10 MG tablet Take 10 mg by mouth daily.    [provider]  EPINEPHrine 0.3 mg/0.3 mL IJ SOAJ injection Inject 0.3 mg into the muscle Once PRN (for allergic reactions).  07/28/16   [provider]  esomeprazole (NEXIUM) 20 MG capsule Take 20 mg by mouth daily.    [provider]  fluticasone (FLONASE) 50 MCG/ACT nasal spray Place 2 sprays into both nostrils daily as needed for allergies.  06/16/17   [provider]  LORazepam (ATIVAN) 0.5 MG tablet  Take 0.5 mg by mouth 3 (three) times daily as needed for anxiety.  07/02/15   [provider]  meloxicam (MOBIC) 7.5 MG tablet Take 7.5 mg by mouth daily.     [provider]  Propylene Glycol (SYSTANE BALANCE) 0.6 % SOLN Place 1 drop into both eyes 2 (two) times daily as needed (dry eyes).    [provider]  traMADol (ULTRAM) 50 MG tablet Take 50 mg by mouth 2 (two) times daily as needed for moderate pain.  12/24/15   [provider]     Allergies Naproxen sodium; Alpha-gal; Galactose; and Sulfa antibiotics  History reviewed. No pertinent family history.  Social History Social History   Tobacco Use  . Smoking status: Never Smoker  . Smokeless tobacco: Never Used  Substance Use Topics  . Alcohol use: Yes    Comment: 3-4 margharitas per week  . Drug use: No    Review of Systems  Constitutional: Negative for fever. Eyes: Negative for visual changes. ENT: Negative for sore throat. Cardiovascular: Positive for chest pain. Respiratory: Negative for shortness of breath. Gastrointestinal: Negative for abdominal pain, vomiting and diarrhea. Genitourinary: Negative for dysuria. Musculoskeletal: Negative for back pain. Skin: Negative for rash. Neurological: Negative for headaches, focal weakness or numbness. ____________________________________________  PHYSICAL EXAM:  VITAL SIGNS: ED Triage Vitals  Enc Vitals Group     BP 04/05/18 1159 139/73     Pulse Rate 04/05/18 1159 62     Resp 04/05/18 1159 16     Temp 04/05/18 1159 98.2 F (36.8 C)     Temp Source 04/05/18 1159 Oral     SpO2 04/05/18 1159 99 %     Weight 04/05/18 1200 269 lb (122 kg)     Height 04/05/18 1200 5\' 7"  (1.702 m)     Head Circumference --      Peak Flow --      Pain Score 04/05/18 1200 4     Pain Loc --      Pain Edu? --      Excl. in [place]? --     Constitutional: Alert and oriented. Well appearing and in no distress. Head: Normocephalic and atraumatic. Eyes: Conjunctivae are normal. PERRL. Normal extraocular movements Ears: Canals clear. TMs intact bilaterally. Nose: No congestion/rhinorrhea/epistaxis. Mouth/Throat: Mucous membranes are moist. Neck: Supple. No thyromegaly. Cardiovascular: Normal rate, regular rhythm.  No murmurs, rubs, or gallops.  Normal distal pulses. Respiratory: Normal respiratory effort. No wheezes/rales/rhonchi. Gastrointestinal: Soft and nontender. No distention or rebound, guarding, or rigidity.   Normoactive bowel sounds noted.. Musculoskeletal: Nontender with normal range of motion in all extremities.  Nontender to palpation across the anterior chest. Neurologic: Cranial nerves II through XII grossly intact.  Normal gait without ataxia. Normal speech and language. No gross focal neurologic deficits are appreciated. Skin:  Skin is warm, dry and intact. No rash noted.  Psychiatric: Mood and affect are normal. Patient exhibits appropriate insight and judgment. ____________________________________________   LABS (pertinent positives/negatives)  Labs Reviewed  BASIC METABOLIC PANEL  CBC  TROPONIN I  TROPONIN I  ____________________________________________  EKG Normal sinus rhythm Ventricular rate 63 bpm PR interval 158 ms QRS duration 76 ms No STEMI ____________________________________________   RADIOLOGY  CXR  IMPRESSION: Mild peribronchial thickening which is likely chronic.  No other significant abnormalities. ____________________________________________  PROCEDURES  Procedures ____________________________________________  INITIAL IMPRESSION / ASSESSMENT AND PLAN / ED COURSE  Differential diagnosis includes, but is not limited to, ACS, aortic dissection, pulmonary embolism, cardiac tamponade, pneumothorax, pneumonia,  pericarditis, myocarditis, GI-related causes including esophagitis/gastritis, and musculoskeletal chest wall pain.    Patient with ED evaluation of a nearly 1 week complaint of anterior midsternal chest pain.  Patient has described improvement of her symptoms since onset, but denies resolution completely.  She was sent here for further evaluation of her chest pain.  Patient and her husband are reassured by her overall benign exam.  Her serial troponins have remained normal in the ED.  Her chest x-ray is reassuring as it shows no acute cardiopulmonary process.  And her baseline labs are without elevation.  Patient is agreeable to follow-up with her  primary provider for ongoing symptoms.  We did discuss return precautions should this pain persist and/or worsen dramatically.  She is advised to take over-the-counter Tylenol as needed for pain relief.  She has a prescription muscle relaxant that she uses as needed.  Patient is otherwise without any indication of an acute cardiac process as the underlying etiology for her chest pain at this time.  She will follow with PCP as discussed. ____________________________________________  FINAL CLINICAL IMPRESSION(S) / ED DIAGNOSES  Final diagnoses:  Atypical chest pain      Carmie End, Dannielle Karvonen, PA-C 04/05/18 1626    Merlyn Lot, MD 04/06/18 1358

## 2018-05-26 ENCOUNTER — Other Ambulatory Visit: Payer: Self-pay | Admitting: Surgery

## 2018-05-26 DIAGNOSIS — M25512 Pain in left shoulder: Secondary | ICD-10-CM

## 2018-05-26 DIAGNOSIS — M7582 Other shoulder lesions, left shoulder: Secondary | ICD-10-CM

## 2018-05-26 DIAGNOSIS — M19012 Primary osteoarthritis, left shoulder: Secondary | ICD-10-CM

## 2018-06-01 ENCOUNTER — Other Ambulatory Visit: Payer: Self-pay

## 2018-06-01 ENCOUNTER — Encounter
Admission: RE | Admit: 2018-06-01 | Discharge: 2018-06-01 | Disposition: A | Discharge status: Home / Self Care | Payer: Medicare Other | Source: Ambulatory Visit | Attending: Surgery | Admitting: Surgery

## 2018-06-01 DIAGNOSIS — K219 Gastro-esophageal reflux disease without esophagitis: Secondary | ICD-10-CM | POA: Diagnosis not present

## 2018-06-01 DIAGNOSIS — E039 Hypothyroidism, unspecified: Secondary | ICD-10-CM | POA: Diagnosis not present

## 2018-06-01 DIAGNOSIS — F419 Anxiety disorder, unspecified: Secondary | ICD-10-CM | POA: Diagnosis not present

## 2018-06-01 DIAGNOSIS — G4730 Sleep apnea, unspecified: Secondary | ICD-10-CM | POA: Insufficient documentation

## 2018-06-01 DIAGNOSIS — Z9889 Other specified postprocedural states: Secondary | ICD-10-CM | POA: Insufficient documentation

## 2018-06-01 DIAGNOSIS — Z01812 Encounter for preprocedural laboratory examination: Secondary | ICD-10-CM | POA: Diagnosis present

## 2018-06-01 LAB — SURGICAL PCR SCREEN
MRSA, PCR: NEGATIVE
Staphylococcus aureus: NEGATIVE

## 2018-06-01 LAB — PROTIME-INR
INR: 1.09
Prothrombin Time: 14 seconds (ref 11.4–15.2)

## 2018-06-01 LAB — CBC
HEMATOCRIT: 41.2 % (ref 35.0–47.0)
Hemoglobin: 14 g/dL (ref 12.0–16.0)
MCH: 33 pg (ref 26.0–34.0)
MCHC: 34.1 g/dL (ref 32.0–36.0)
MCV: 96.7 fL (ref 80.0–100.0)
Platelets: 214 10*3/uL (ref 150–440)
RBC: 4.26 MIL/uL (ref 3.80–5.20)
RDW: 12.9 % (ref 11.5–14.5)
WBC: 6.6 10*3/uL (ref 3.6–11.0)

## 2018-06-01 LAB — BASIC METABOLIC PANEL
ANION GAP: 8 (ref 5–15)
BUN: 20 mg/dL (ref 8–23)
CALCIUM: 9.2 mg/dL (ref 8.9–10.3)
CHLORIDE: 105 mmol/L (ref 98–111)
CO2: 28 mmol/L (ref 22–32)
Creatinine, Ser: 0.84 mg/dL (ref 0.44–1.00)
GFR calc Af Amer: >60 mL/min (ref >60)
GFR calc non Af Amer: >60 mL/min (ref >60)
GLUCOSE: 100 mg/dL — AB (ref 70–99)
Potassium: 3.7 mmol/L (ref 3.5–5.1)
Sodium: 141 mmol/L (ref 135–145)

## 2018-06-01 LAB — URINALYSIS, ROUTINE W REFLEX MICROSCOPIC
Bilirubin Urine: NEGATIVE
Glucose, UA: NEGATIVE mg/dL
HGB URINE DIPSTICK: NEGATIVE
Ketones, ur: NEGATIVE mg/dL
Leukocytes, UA: NEGATIVE
NITRITE: NEGATIVE
Protein, ur: NEGATIVE mg/dL
SPECIFIC GRAVITY, URINE: 1.011 (ref 1.005–1.030)
pH: 5 (ref 5.0–8.0)

## 2018-06-01 NOTE — Anesthesia Pain Management Evaluation Note (Signed)
PATIENT STATES LIPS SWELLED IN PAST WITH ANESTHESIA WHEN ENDO TUBE USED

## 2018-06-01 NOTE — Patient Instructions (Signed)
Your procedure is scheduled on: 06/15/18 Report to Day Surgery. Medical mall second floor To find out your arrival time please call [phone] between 1PM - 3PM on  06/14/18.  Remember: Instructions that are not followed completely may result in serious medical risk,  up to and including death, or upon the discretion of your surgeon and anesthesiologist your  surgery may need to be rescheduled.     _X__ 1. Do not eat food after midnight the night before your procedure.                 No gum chewing or hard candies. You may drink clear liquids up to 2 hours                 before you are scheduled to arrive for your surgery- DO not drink clear                 liquids within 2 hours of the start of your surgery.                 Clear Liquids include:  water, apple juice without pulp, clear carbohydrate                 drink such as Clearfast of Gatorade, Black Coffee or Tea (Do not add                 anything to coffee or tea).  __X__2.  On the morning of surgery brush your teeth with toothpaste and water, you                may rinse your mouth with mouthwash if you wish.  Do not swallow any toothpaste of mouthwash.     _X__ 3.  No Alcohol for 24 hours before or after surgery.   _X__ 4.  Do Not Smoke or use e-cigarettes For 24 Hours Prior to Your Surgery.                 Do not use any chewable tobacco products for at least 6 hours prior to                 surgery.  ____  5.  Bring all medications with you on the day of surgery if instructed.   _x___  6.  Notify your doctor if there is any change in your medical condition      (cold, fever, infections).     Do not wear jewelry, make-up, hairpins, clips or nail polish. Do not wear lotions, powders, or perfumes. You may wear deodorant. Do not shave 48 hours prior to surgery. Men may shave face and neck. Do not bring valuables to the hospital.    [facility] is not responsible for any belongings or  valuables.  Contacts, dentures or bridgework may not be worn into surgery. Leave your suitcase in the car. After surgery it may be brought to your room. For patients admitted to the hospital, discharge time is determined by your treatment team.   Patients discharged the day of surgery will not be allowed to drive home.   Please read over the following fact sheets that you were given:   Surgical Site Infection Prevention / MRSA  _X___ Take these medicines the morning of surgery with A SIP OF WATER:    1. OMEPRAZOLE AT BEDTIME AND AM OF SURGERY  2.   3.   4.  5.  6.  ____ Fleet Enema (as directed)  _X___ Use CHG Soap as directed  ____ Use inhalers on the day of surgery  ____ Stop metformin 2 days prior to surgery    ____ Take 1/2 of usual insulin dose the night before surgery. No insulin the morning          of surgery.   _X___ Stop Coumadin/Plavix/aspirin on     STOP ASPIRIN 1 WEEK BEFORE SURGERY  __X__ Stop Anti-inflammatories on   STOP MELOXICAM 1 WEEK BEFORE SURGERY   ____ Stop supplements until after surgery.    ____ Bring C-Pap to the hospital.

## 2018-06-03 LAB — URINE CULTURE

## 2018-06-05 NOTE — Pre-Procedure Instructions (Signed)
[facility] FAXED TO DR POGGI 

## 2018-06-09 ENCOUNTER — Ambulatory Visit
Admission: RE | Admit: 2018-06-09 | Discharge: 2018-06-09 | Disposition: A | Discharge status: Home / Self Care | Payer: Medicare Other | Source: Ambulatory Visit | Attending: Surgery | Admitting: Surgery

## 2018-06-09 DIAGNOSIS — M7582 Other shoulder lesions, left shoulder: Secondary | ICD-10-CM

## 2018-06-09 DIAGNOSIS — M75112 Incomplete rotator cuff tear or rupture of left shoulder, not specified as traumatic: Secondary | ICD-10-CM | POA: Diagnosis present

## 2018-06-09 DIAGNOSIS — M25512 Pain in left shoulder: Secondary | ICD-10-CM

## 2018-06-09 DIAGNOSIS — M19012 Primary osteoarthritis, left shoulder: Secondary | ICD-10-CM

## 2018-06-15 ENCOUNTER — Other Ambulatory Visit: Payer: Self-pay

## 2018-06-15 ENCOUNTER — Encounter
Admission: RE | Disposition: A | Discharge status: Home Health | Payer: Self-pay | Source: Home / Self Care | Attending: Surgery

## 2018-06-15 ENCOUNTER — Inpatient Hospital Stay
Admission: RE | Admit: 2018-06-15 | Discharge: 2018-06-16 | DRG: 483 | Disposition: A | Discharge status: Home Health | Payer: Medicare Other | Attending: Surgery | Admitting: Surgery

## 2018-06-15 ENCOUNTER — Inpatient Hospital Stay: Payer: Medicare Other

## 2018-06-15 ENCOUNTER — Inpatient Hospital Stay: Payer: Medicare Other | Admitting: Certified Registered Nurse Anesthetist

## 2018-06-15 ENCOUNTER — Encounter: Payer: Self-pay | Admitting: Emergency Medicine

## 2018-06-15 DIAGNOSIS — M19012 Primary osteoarthritis, left shoulder: Principal | ICD-10-CM | POA: Diagnosis present

## 2018-06-15 DIAGNOSIS — M75102 Unspecified rotator cuff tear or rupture of left shoulder, not specified as traumatic: Secondary | ICD-10-CM | POA: Diagnosis present

## 2018-06-15 DIAGNOSIS — Z96652 Presence of left artificial knee joint: Secondary | ICD-10-CM | POA: Diagnosis present

## 2018-06-15 DIAGNOSIS — Z791 Long term (current) use of non-steroidal anti-inflammatories (NSAID): Secondary | ICD-10-CM

## 2018-06-15 DIAGNOSIS — K219 Gastro-esophageal reflux disease without esophagitis: Secondary | ICD-10-CM | POA: Diagnosis present

## 2018-06-15 DIAGNOSIS — Z886 Allergy status to analgesic agent status: Secondary | ICD-10-CM | POA: Diagnosis not present

## 2018-06-15 DIAGNOSIS — Z91018 Allergy to other foods: Secondary | ICD-10-CM | POA: Diagnosis not present

## 2018-06-15 DIAGNOSIS — Z96641 Presence of right artificial hip joint: Secondary | ICD-10-CM | POA: Diagnosis present

## 2018-06-15 DIAGNOSIS — Z6841 Body Mass Index (BMI) 40.0 and over, adult: Secondary | ICD-10-CM

## 2018-06-15 DIAGNOSIS — Z7982 Long term (current) use of aspirin: Secondary | ICD-10-CM | POA: Diagnosis not present

## 2018-06-15 DIAGNOSIS — Z96612 Presence of left artificial shoulder joint: Secondary | ICD-10-CM

## 2018-06-15 DIAGNOSIS — Z882 Allergy status to sulfonamides status: Secondary | ICD-10-CM | POA: Diagnosis not present

## 2018-06-15 DIAGNOSIS — M25512 Pain in left shoulder: Secondary | ICD-10-CM | POA: Diagnosis present

## 2018-06-15 HISTORY — PX: REVERSE SHOULDER ARTHROPLASTY: SHX5054

## 2018-06-15 LAB — TYPE AND SCREEN
ABO/RH(D): A POS
ANTIBODY SCREEN: POSITIVE
DAT, COMPLEMENT: NEGATIVE
DAT, IGG: POSITIVE
Unit division: 0
Unit division: 0

## 2018-06-15 LAB — BPAM RBC
BLOOD PRODUCT EXPIRATION DATE: 201908292359
Blood Product Expiration Date: 201908302359
UNIT TYPE AND RH: 6200
Unit Type and Rh: 6200

## 2018-06-15 SURGERY — ARTHROPLASTY, SHOULDER, TOTAL, REVERSE
Anesthesia: General | Site: Shoulder | Laterality: Left | Wound class: Clean

## 2018-06-15 MED ORDER — GLYCOPYRROLATE 0.2 MG/ML IJ SOLN
INTRAMUSCULAR | Status: AC
  Filled 2018-06-15: qty 1

## 2018-06-15 MED ORDER — ONDANSETRON HCL 4 MG/2ML IJ SOLN: 4.0000 mg | Freq: Four times a day (QID) | INTRAMUSCULAR | Status: DC | PRN

## 2018-06-15 MED ORDER — FENTANYL CITRATE (PF) 100 MCG/2ML IJ SOLN: 25.0000 ug | INTRAMUSCULAR | Status: DC | PRN

## 2018-06-15 MED ORDER — KETOROLAC TROMETHAMINE 30 MG/ML IJ SOLN
INTRAMUSCULAR | Status: AC
  Filled 2018-06-15: qty 1

## 2018-06-15 MED ORDER — DEXAMETHASONE SODIUM PHOSPHATE 10 MG/ML IJ SOLN
INTRAMUSCULAR | Status: AC
  Filled 2018-06-15: qty 1

## 2018-06-15 MED ORDER — PHENYLEPHRINE HCL 10 MG/ML IJ SOLN
INTRAMUSCULAR | Status: DC | PRN
  Administered 2018-06-15 (×2): 100 ug via INTRAVENOUS
  Administered 2018-06-15: 200 ug via INTRAVENOUS
  Administered 2018-06-15: 100 ug via INTRAVENOUS

## 2018-06-15 MED ORDER — EPHEDRINE SULFATE 50 MG/ML IJ SOLN
INTRAMUSCULAR | Status: DC | PRN
  Administered 2018-06-15 (×3): 5 mg via INTRAVENOUS
  Administered 2018-06-15: 10 mg via INTRAVENOUS

## 2018-06-15 MED ORDER — BISACODYL 10 MG RE SUPP: 10.0000 mg | Freq: Every day | RECTAL | Status: DC | PRN

## 2018-06-15 MED ORDER — PHENYLEPHRINE HCL 10 MG/ML IJ SOLN
INTRAMUSCULAR | Status: AC
  Filled 2018-06-15: qty 1

## 2018-06-15 MED ORDER — BUPIVACAINE HCL (PF) 0.5 % IJ SOLN
INTRAMUSCULAR | Status: AC
  Filled 2018-06-15: qty 10

## 2018-06-15 MED ORDER — ACETAMINOPHEN 10 MG/ML IV SOLN
INTRAVENOUS | Status: DC | PRN
  Administered 2018-06-15: 1000 mg via INTRAVENOUS

## 2018-06-15 MED ORDER — LIDOCAINE HCL (PF) 1 % IJ SOLN
INTRAMUSCULAR | Status: AC
  Filled 2018-06-15: qty 5

## 2018-06-15 MED ORDER — ONDANSETRON HCL 4 MG/2ML IJ SOLN
INTRAMUSCULAR | Status: DC | PRN
  Administered 2018-06-15: 4 mg via INTRAVENOUS

## 2018-06-15 MED ORDER — LIDOCAINE HCL (PF) 2 % IJ SOLN
INTRAMUSCULAR | Status: AC
  Filled 2018-06-15: qty 10

## 2018-06-15 MED ORDER — TRAMADOL HCL 50 MG PO TABS
50.0000 mg | ORAL_TABLET | Freq: Four times a day (QID) | ORAL | Status: DC | PRN
  Administered 2018-06-15: 50 mg via ORAL
  Filled 2018-06-15: qty 1

## 2018-06-15 MED ORDER — ENOXAPARIN SODIUM 40 MG/0.4ML ~~LOC~~ SOLN
40.0000 mg | SUBCUTANEOUS | Status: DC
  Administered 2018-06-16: 40 mg via SUBCUTANEOUS
  Filled 2018-06-15: qty 0.4

## 2018-06-15 MED ORDER — MIDAZOLAM HCL 2 MG/2ML IJ SOLN
INTRAMUSCULAR | Status: AC
  Filled 2018-06-15: qty 2

## 2018-06-15 MED ORDER — ACETAMINOPHEN 325 MG PO TABS: 325.0000 mg | ORAL_TABLET | Freq: Four times a day (QID) | ORAL | Status: DC | PRN

## 2018-06-15 MED ORDER — DEXTROSE 5 % IV SOLN
3.0000 g | Freq: Four times a day (QID) | INTRAVENOUS | Status: AC
  Administered 2018-06-15 (×3): 3 g via INTRAVENOUS
  Filled 2018-06-15 (×3): qty 3

## 2018-06-15 MED ORDER — ACETAMINOPHEN 500 MG PO TABS
1000.0000 mg | ORAL_TABLET | Freq: Four times a day (QID) | ORAL | Status: AC
  Administered 2018-06-15 – 2018-06-16 (×4): 1000 mg via ORAL
  Filled 2018-06-15 (×4): qty 2

## 2018-06-15 MED ORDER — TRANEXAMIC ACID 1000 MG/10ML IV SOLN
INTRAVENOUS | Status: DC | PRN
  Administered 2018-06-15: 1000 mg via INTRAVENOUS

## 2018-06-15 MED ORDER — ONDANSETRON HCL 4 MG/2ML IJ SOLN
INTRAMUSCULAR | Status: AC
  Filled 2018-06-15: qty 2

## 2018-06-15 MED ORDER — FENTANYL CITRATE (PF) 100 MCG/2ML IJ SOLN
INTRAMUSCULAR | Status: DC | PRN
  Administered 2018-06-15 (×2): 50 ug via INTRAVENOUS

## 2018-06-15 MED ORDER — MIDAZOLAM HCL 2 MG/2ML IJ SOLN
INTRAMUSCULAR | Status: DC | PRN
  Administered 2018-06-15: 2 mg via INTRAVENOUS

## 2018-06-15 MED ORDER — KETOROLAC TROMETHAMINE 30 MG/ML IJ SOLN
INTRAMUSCULAR | Status: DC | PRN
  Administered 2018-06-15: 30 mg via INTRAVENOUS

## 2018-06-15 MED ORDER — DOCUSATE SODIUM 100 MG PO CAPS
100.0000 mg | ORAL_CAPSULE | Freq: Two times a day (BID) | ORAL | Status: DC
  Administered 2018-06-15 – 2018-06-16 (×3): 100 mg via ORAL
  Filled 2018-06-15 (×3): qty 1

## 2018-06-15 MED ORDER — HYDROMORPHONE HCL 1 MG/ML IJ SOLN: 0.5000 mg | INTRAMUSCULAR | Status: DC | PRN

## 2018-06-15 MED ORDER — LORAZEPAM 0.5 MG PO TABS: 0.5000 mg | ORAL_TABLET | Freq: Every evening | ORAL | Status: DC | PRN

## 2018-06-15 MED ORDER — ONDANSETRON HCL 4 MG/2ML IJ SOLN: 4.0000 mg | Freq: Once | INTRAMUSCULAR | Status: DC | PRN

## 2018-06-15 MED ORDER — ONDANSETRON HCL 4 MG PO TABS: 4.0000 mg | ORAL_TABLET | Freq: Four times a day (QID) | ORAL | Status: DC | PRN

## 2018-06-15 MED ORDER — PANTOPRAZOLE SODIUM 40 MG PO TBEC
40.0000 mg | DELAYED_RELEASE_TABLET | Freq: Every day | ORAL | Status: DC
  Administered 2018-06-15 – 2018-06-16 (×2): 40 mg via ORAL
  Filled 2018-06-15 (×2): qty 1

## 2018-06-15 MED ORDER — MIDAZOLAM HCL 2 MG/2ML IJ SOLN
INTRAMUSCULAR | Status: AC
  Administered 2018-06-15: 1 mg via INTRAVENOUS
  Filled 2018-06-15: qty 2

## 2018-06-15 MED ORDER — GLYCOPYRROLATE 0.2 MG/ML IJ SOLN
INTRAMUSCULAR | Status: DC | PRN
  Administered 2018-06-15: 0.2 mg via INTRAVENOUS

## 2018-06-15 MED ORDER — ROCURONIUM BROMIDE 50 MG/5ML IV SOLN
INTRAVENOUS | Status: AC
  Filled 2018-06-15: qty 1

## 2018-06-15 MED ORDER — ROCURONIUM BROMIDE 100 MG/10ML IV SOLN
INTRAVENOUS | Status: DC | PRN
  Administered 2018-06-15: 50 mg via INTRAVENOUS

## 2018-06-15 MED ORDER — DEXAMETHASONE SODIUM PHOSPHATE 10 MG/ML IJ SOLN
INTRAMUSCULAR | Status: DC | PRN
  Administered 2018-06-15: 10 mg via INTRAVENOUS

## 2018-06-15 MED ORDER — GLYCERIN-HYPROMELLOSE-PEG 400 0.2-0.2-1 % OP SOLN
1.0000 [drp] | Freq: Two times a day (BID) | OPHTHALMIC | Status: DC
  Administered 2018-06-15 – 2018-06-16 (×3): 1 [drp] via OPHTHALMIC
  Filled 2018-06-15 (×2): qty 15

## 2018-06-15 MED ORDER — NEOMYCIN-POLYMYXIN B GU 40-200000 IR SOLN
Status: DC | PRN
  Administered 2018-06-15: 14 mL

## 2018-06-15 MED ORDER — SUGAMMADEX SODIUM 200 MG/2ML IV SOLN
INTRAVENOUS | Status: AC
  Filled 2018-06-15: qty 2

## 2018-06-15 MED ORDER — DIPHENHYDRAMINE HCL 12.5 MG/5ML PO ELIX: 12.5000 mg | ORAL_SOLUTION | ORAL | Status: DC | PRN

## 2018-06-15 MED ORDER — FENTANYL CITRATE (PF) 100 MCG/2ML IJ SOLN
INTRAMUSCULAR | Status: AC
  Filled 2018-06-15: qty 2

## 2018-06-15 MED ORDER — LACTATED RINGERS IV SOLN
INTRAVENOUS | Status: DC
  Administered 2018-06-15: 07:00:00 via INTRAVENOUS

## 2018-06-15 MED ORDER — FLEET ENEMA 7-19 GM/118ML RE ENEM: 1.0000 | ENEMA | Freq: Once | RECTAL | Status: DC | PRN

## 2018-06-15 MED ORDER — NEOMYCIN-POLYMYXIN B GU 40-200000 IR SOLN
Status: AC
  Filled 2018-06-15: qty 20

## 2018-06-15 MED ORDER — METOCLOPRAMIDE HCL 10 MG PO TABS: 5.0000 mg | ORAL_TABLET | Freq: Three times a day (TID) | ORAL | Status: DC | PRN

## 2018-06-15 MED ORDER — EPHEDRINE SULFATE 50 MG/ML IJ SOLN
INTRAMUSCULAR | Status: AC
  Filled 2018-06-15: qty 1

## 2018-06-15 MED ORDER — ACETAMINOPHEN 10 MG/ML IV SOLN
INTRAVENOUS | Status: AC
  Filled 2018-06-15: qty 100

## 2018-06-15 MED ORDER — LORATADINE 10 MG PO TABS
10.0000 mg | ORAL_TABLET | Freq: Every day | ORAL | Status: DC
Start: 2018-06-15 — End: 2018-06-16
  Filled 2018-06-15: qty 1

## 2018-06-15 MED ORDER — FLUTICASONE PROPIONATE 50 MCG/ACT NA SUSP
2.0000 | Freq: Every day | NASAL | Status: DC | PRN
  Filled 2018-06-15: qty 16

## 2018-06-15 MED ORDER — BUPIVACAINE LIPOSOME 1.3 % IJ SUSP
INTRAMUSCULAR | Status: AC
  Filled 2018-06-15: qty 20

## 2018-06-15 MED ORDER — LIDOCAINE HCL (CARDIAC) PF 100 MG/5ML IV SOSY
PREFILLED_SYRINGE | INTRAVENOUS | Status: DC | PRN
  Administered 2018-06-15: 100 mg via INTRAVENOUS

## 2018-06-15 MED ORDER — PROPOFOL 10 MG/ML IV BOLUS
INTRAVENOUS | Status: AC
  Filled 2018-06-15: qty 20

## 2018-06-15 MED ORDER — MAGNESIUM HYDROXIDE 400 MG/5ML PO SUSP: 30.0000 mL | Freq: Every day | ORAL | Status: DC | PRN

## 2018-06-15 MED ORDER — KETOROLAC TROMETHAMINE 15 MG/ML IJ SOLN
15.0000 mg | Freq: Four times a day (QID) | INTRAMUSCULAR | Status: AC
  Administered 2018-06-15 – 2018-06-16 (×4): 15 mg via INTRAVENOUS
  Filled 2018-06-15 (×4): qty 1

## 2018-06-15 MED ORDER — PROPOFOL 10 MG/ML IV BOLUS
INTRAVENOUS | Status: DC | PRN
  Administered 2018-06-15: 30 mg via INTRAVENOUS
  Administered 2018-06-15: 170 mg via INTRAVENOUS

## 2018-06-15 MED ORDER — SUGAMMADEX SODIUM 200 MG/2ML IV SOLN
INTRAVENOUS | Status: DC | PRN
  Administered 2018-06-15: 200 mg via INTRAVENOUS

## 2018-06-15 MED ORDER — FENTANYL CITRATE (PF) 100 MCG/2ML IJ SOLN
50.0000 ug | Freq: Once | INTRAMUSCULAR | Status: AC
  Administered 2018-06-15: 50 ug via INTRAVENOUS

## 2018-06-15 MED ORDER — BUPIVACAINE-EPINEPHRINE (PF) 0.5% -1:200000 IJ SOLN
INTRAMUSCULAR | Status: AC
  Filled 2018-06-15: qty 30

## 2018-06-15 MED ORDER — TRANEXAMIC ACID 1000 MG/10ML IV SOLN
INTRAVENOUS | Status: AC
  Filled 2018-06-15: qty 10

## 2018-06-15 MED ORDER — METOCLOPRAMIDE HCL 5 MG/ML IJ SOLN: 5.0000 mg | Freq: Three times a day (TID) | INTRAMUSCULAR | Status: DC | PRN

## 2018-06-15 MED ORDER — SODIUM CHLORIDE 0.9 % IV SOLN
INTRAVENOUS | Status: DC
  Administered 2018-06-15: 12:00:00 via INTRAVENOUS

## 2018-06-15 MED ORDER — FENTANYL CITRATE (PF) 100 MCG/2ML IJ SOLN
INTRAMUSCULAR | Status: AC
  Administered 2018-06-15: 50 ug via INTRAVENOUS
  Filled 2018-06-15: qty 2

## 2018-06-15 MED ORDER — CEFAZOLIN SODIUM 10 G IJ SOLR
3.0000 g | Freq: Once | INTRAMUSCULAR | Status: AC
  Administered 2018-06-15: 3 g via INTRAVENOUS
  Filled 2018-06-15: qty 3000
  Filled 2018-06-15: qty 3

## 2018-06-15 MED ORDER — OXYCODONE HCL 5 MG PO TABS
5.0000 mg | ORAL_TABLET | ORAL | Status: DC | PRN
  Administered 2018-06-15: 5 mg via ORAL
  Filled 2018-06-15: qty 1

## 2018-06-15 MED ORDER — BUPIVACAINE-EPINEPHRINE (PF) 0.5% -1:200000 IJ SOLN
INTRAMUSCULAR | Status: DC | PRN
  Administered 2018-06-15: 30 mL via PERINEURAL

## 2018-06-15 MED ORDER — MIDAZOLAM HCL 2 MG/2ML IJ SOLN
1.0000 mg | Freq: Once | INTRAMUSCULAR | Status: AC
Start: 2018-06-15 — End: 2018-06-15
  Administered 2018-06-15: 1 mg via INTRAVENOUS

## 2018-06-15 MED ORDER — ASPIRIN EC 81 MG PO TBEC
81.0000 mg | DELAYED_RELEASE_TABLET | Freq: Every day | ORAL | Status: DC
  Administered 2018-06-15 – 2018-06-16 (×2): 81 mg via ORAL
  Filled 2018-06-15 (×2): qty 1

## 2018-06-15 SURGICAL SUPPLY — 68 items
BASEPLATE GLENOSPHERE 25 (Plate) ×1 IMPLANT
BIT DRILL TWIST 2.7 (BIT) ×1 IMPLANT
BLADE SAGITTAL WIDE XTHICK NO (BLADE) ×2 IMPLANT
BRNG HUM +3 36 RVRS SHLDR (Shoulder) ×1 IMPLANT
CANISTER SUCT 1200ML W/VALVE (MISCELLANEOUS) ×2 IMPLANT
CANISTER SUCT 3000ML PPV (MISCELLANEOUS) ×4 IMPLANT
CHLORAPREP W/TINT 26ML (MISCELLANEOUS) ×2 IMPLANT
COOLER POLAR GLACIER W/PUMP (MISCELLANEOUS) ×2 IMPLANT
CRADLE LAMINECT ARM (MISCELLANEOUS) ×2 IMPLANT
DRAPE IMP U-DRAPE 54X76 (DRAPES) ×4 IMPLANT
DRAPE INCISE IOBAN 66X45 STRL (DRAPES) ×4 IMPLANT
DRAPE SHEET LG 3/4 BI-LAMINATE (DRAPES) ×4 IMPLANT
DRAPE TABLE BACK 80X90 (DRAPES) ×2 IMPLANT
DRSG OPSITE POSTOP 4X8 (GAUZE/BANDAGES/DRESSINGS) ×2 IMPLANT
ELECT BLADE 6.5 EXT (BLADE) IMPLANT
ELECT CAUTERY BLADE 6.4 (BLADE) ×2 IMPLANT
GLENOID SPHERE STD STRL 36MM (Orthopedic Implant) ×1 IMPLANT
GLOVE BIO SURGEON STRL SZ7.5 (GLOVE) ×8 IMPLANT
GLOVE BIO SURGEON STRL SZ8 (GLOVE) ×8 IMPLANT
GLOVE BIOGEL PI IND STRL 8 (GLOVE) ×1 IMPLANT
GLOVE BIOGEL PI INDICATOR 8 (GLOVE) ×1
GLOVE INDICATOR 8.0 STRL GRN (GLOVE) ×2 IMPLANT
GOWN STRL REUS W/ TWL LRG LVL3 (GOWN DISPOSABLE) ×1 IMPLANT
GOWN STRL REUS W/ TWL XL LVL3 (GOWN DISPOSABLE) ×1 IMPLANT
GOWN STRL REUS W/TWL LRG LVL3 (GOWN DISPOSABLE) ×2
GOWN STRL REUS W/TWL XL LVL3 (GOWN DISPOSABLE) ×2
HOOD PEEL AWAY FLYTE STAYCOOL (MISCELLANEOUS) ×6 IMPLANT
KIT STABILIZATION SHOULDER (MISCELLANEOUS) ×2 IMPLANT
KIT TURNOVER KIT A (KITS) ×2 IMPLANT
MASK FACE SPIDER DISP (MASK) ×2 IMPLANT
MAT ABSORB  FLUID 56X50 GRAY (MISCELLANEOUS) ×1
MAT ABSORB FLUID 56X50 GRAY (MISCELLANEOUS) ×1 IMPLANT
NDL SAFETY ECLIPSE 18X1.5 (NEEDLE) ×1 IMPLANT
NDL SPNL 20GX3.5 QUINCKE YW (NEEDLE) ×1 IMPLANT
NEEDLE HYPO 18GX1.5 SHARP (NEEDLE) ×2
NEEDLE HYPO 22GX1.5 SAFETY (NEEDLE) ×2 IMPLANT
NEEDLE SPNL 20GX3.5 QUINCKE YW (NEEDLE) ×2 IMPLANT
NS IRRIG 500ML POUR BTL (IV SOLUTION) ×2 IMPLANT
PACK ARTHROSCOPY SHOULDER (MISCELLANEOUS) ×2 IMPLANT
PAD WRAPON POLAR SHDR UNIV (MISCELLANEOUS) ×1 IMPLANT
PIN THREADED REVERSE (PIN) ×1 IMPLANT
PULSAVAC PLUS IRRIG FAN TIP (DISPOSABLE) ×2
SCREW BONE CORT 6.5X35MM (Screw) ×1 IMPLANT
SCREW BONE LOCKING 4.75X30X3.5 (Screw) ×1 IMPLANT
SCREW LOCKING NS 4.75MMX20MM (Screw) ×1 IMPLANT
SCREW NON-LOCK 4.75MMX15MM (Screw) ×1 IMPLANT
SCREW NON-LOCK 4.75MMX25MMX3.5 (Screw) ×1 IMPLANT
SLING ULTRA II LG (MISCELLANEOUS) ×1 IMPLANT
SLING ULTRA II M (MISCELLANEOUS) ×1 IMPLANT
SOL .9 NS 3000ML IRR  AL (IV SOLUTION) ×1
SOL .9 NS 3000ML IRR AL (IV SOLUTION) ×1
SOL .9 NS 3000ML IRR UROMATIC (IV SOLUTION) ×1 IMPLANT
SPONGE LAP 18X18 RF (DISPOSABLE) ×2 IMPLANT
STAPLER SKIN PROX 35W (STAPLE) ×2 IMPLANT
STEM HUMERAL STRL 8MMX55MM (Stem) ×1 IMPLANT
SUT ETHIBOND 0 MO6 C/R (SUTURE) ×2 IMPLANT
SUT FIBERWIRE #2 38 BLUE 1/2 (SUTURE) ×8
SUT VIC AB 0 CT1 36 (SUTURE) ×2 IMPLANT
SUT VIC AB 2-0 CT1 27 (SUTURE) ×4
SUT VIC AB 2-0 CT1 TAPERPNT 27 (SUTURE) ×2 IMPLANT
SUTURE FIBERWR #2 38 BLUE 1/2 (SUTURE) ×4 IMPLANT
SYR 10ML LL (SYRINGE) ×2 IMPLANT
SYR 30ML LL (SYRINGE) IMPLANT
TIP FAN IRRIG PULSAVAC PLUS (DISPOSABLE) ×1 IMPLANT
TRAY FOLEY MTR SLVR 16FR STAT (SET/KITS/TRAYS/PACK) ×2 IMPLANT
TRAY HUM MINI SHOULDER +0 40D (Shoulder) ×1 IMPLANT
TRAY HUM REV SHOULDER 36 +3 (Shoulder) ×1 IMPLANT
WRAPON POLAR PAD SHDR UNIV (MISCELLANEOUS) ×2

## 2018-06-15 NOTE — NC FL2 (Signed)
[place] LEVEL OF CARE SCREENING TOOL     IDENTIFICATION  Patient Name: Kelsey Woods Birthdate: 1951/02/24 Sex: female Admission Date (Current Location): 06/15/2018  [place] and [place] Number:  Engineering geologist and Address:  [facility], [address] [place], [place] [address]      Provider Number: [id number]  Attending Physician Name and Address:  Corky Mull, MD  Relative Name and Phone Number:       Current Level of Care: Hospital Recommended Level of Care: [place] Prior Approval Number:    Date Approved/Denied:   PASRR Number: [id number])  Discharge Plan: SNF    Current Diagnoses: Patient Active Problem List   Diagnosis Date Noted  . 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 Colon cancer screening   . Bilateral carpal tunnel syndrome 03/17/2017  . 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 Lumbar radiculitis 08/14/2014  . DDD (degenerative disc disease), lumbar 08/14/2014  . Sleep apnea 08/07/2014  . Obesity, unspecified 08/07/2014  . History of hypothyroidism 08/07/2014  . Chronic kidney disease 08/07/2014  . Arthritis 08/07/2014    Orientation RESPIRATION BLADDER Height & Weight     Self, Time, Situation, Place  Normal Continent Weight: 123.2 kg Height:  5\' 7"  (170.2 cm)  BEHAVIORAL SYMPTOMS/MOOD NEUROLOGICAL BOWEL NUTRITION STATUS      Continent Diet(Diet: Clear Liquid to be Advanced. )  AMBULATORY STATUS COMMUNICATION OF NEEDS Skin   Extensive Assist Verbally Surgical wounds(Incision: Left Shoulder. )                       Personal Care Assistance Level of Assistance  Bathing, Feeding, Dressing Bathing Assistance: Limited assistance Feeding assistance: Independent Dressing Assistance: Limited assistance     Functional Limitations Info  Sight, Hearing, Speech Sight Info: Adequate Hearing Info: Adequate Speech Info: Adequate    SPECIAL  CARE FACTORS FREQUENCY  PT (By licensed PT), OT (By licensed OT)     PT Frequency: (5) OT Frequency: (5)            Contractures      Additional Factors Info  Code Status, Allergies Code Status Info: (Full Code. ) Allergies Info: (Naproxen Sodium, Alpha-gal, Sulfa Antibiotics)           Current Medications (06/15/2018):  This is the current hospital active medication list Current Facility-Administered Medications  Medication Dose Route Frequency Provider Last Rate Last Dose  . 0.9 %  sodium chloride infusion   Intravenous Continuous Poggi, Marshall Cork, MD 75 mL/hr at 06/15/18 1205    . acetaminophen (TYLENOL) tablet 1,000 mg  1,000 mg Oral Q6H Poggi, Marshall Cork, MD   1,000 mg at 06/15/18 1225  . [START ON 06/16/2018] acetaminophen (TYLENOL) tablet 325-650 mg  325-650 mg Oral Q6H PRN Poggi, Marshall Cork, MD      . aspirin EC tablet 81 mg  81 mg Oral Daily Poggi, Marshall Cork, MD   81 mg at 06/15/18 1225  . bisacodyl (DULCOLAX) suppository 10 mg  10 mg Rectal Daily PRN Poggi, Marshall Cork, MD      . bupivacaine (MARCAINE) 0.5 % injection           . bupivacaine liposome (EXPAREL) 1.3 % injection           . ceFAZolin (ANCEF) 3 g in dextrose 5 % 50 mL IVPB  3 g Intravenous Q6H Poggi, Marshall Cork, MD   Stopped at 06/15/18 1305  .  diphenhydrAMINE (BENADRYL) 12.5 MG/5ML elixir 12.5-25 mg  12.5-25 mg Oral Q4H PRN Poggi, Marshall Cork, MD      . docusate sodium (COLACE) capsule 100 mg  100 mg Oral BID Poggi, Marshall Cork, MD   100 mg at 06/15/18 1225  . [START ON 06/16/2018] enoxaparin (LOVENOX) injection 40 mg  40 mg Subcutaneous Q24H Poggi, Marshall Cork, MD      . fluticasone (FLONASE) 50 MCG/ACT nasal spray 2 spray  2 spray Each Nare Daily PRN Poggi, Marshall Cork, MD      . Glycerin-Hypromellose-PEG 400 0.2-0.2-1 % SOLN 1 drop  1 drop Both Eyes BID Poggi, Marshall Cork, MD   1 drop at 06/15/18 1452  . HYDROmorphone (DILAUDID) injection 0.5-1 mg  0.5-1 mg Intravenous Q4H PRN Poggi, Marshall Cork, MD      . ketorolac (TORADOL) 15 MG/ML injection 15 mg  15 mg  Intravenous Q6H Poggi, Marshall Cork, MD   15 mg at 06/15/18 1226  . lidocaine (PF) (XYLOCAINE) 1 % injection           . loratadine (CLARITIN) tablet 10 mg  10 mg Oral Daily Poggi, Marshall Cork, MD      . LORazepam (ATIVAN) tablet 0.5 mg  0.5 mg Oral QHS PRN Poggi, Marshall Cork, MD      . magnesium hydroxide (MILK OF MAGNESIA) suspension 30 mL  30 mL Oral Daily PRN Poggi, Marshall Cork, MD      . metoCLOPramide (REGLAN) tablet 5-10 mg  5-10 mg Oral Q8H PRN Poggi, Marshall Cork, MD       Or  . metoCLOPramide (REGLAN) injection 5-10 mg  5-10 mg Intravenous Q8H PRN Poggi, Marshall Cork, MD      . ondansetron (ZOFRAN) tablet 4 mg  4 mg Oral Q6H PRN Poggi, Marshall Cork, MD       Or  . ondansetron (ZOFRAN) injection 4 mg  4 mg Intravenous Q6H PRN Poggi, Marshall Cork, MD      . oxyCODONE (Oxy IR/ROXICODONE) immediate release tablet 5-10 mg  5-10 mg Oral Q4H PRN Poggi, Marshall Cork, MD      . pantoprazole (PROTONIX) EC tablet 40 mg  40 mg Oral Daily Poggi, Marshall Cork, MD   40 mg at 06/15/18 1226  . sodium phosphate (FLEET) 7-19 GM/118ML enema 1 enema  1 enema Rectal Once PRN Poggi, Marshall Cork, MD      . traMADol Veatrice Bourbon) tablet 50 mg  50 mg Oral Q6H PRN Poggi, Marshall Cork, MD         Discharge Medications: Please see discharge summary for a list of discharge medications.  Relevant Imaging Results:  Relevant Lab Results:   Additional Information (SSN: [id number])  [name], [name], LCSW

## 2018-06-15 NOTE — Evaluation (Signed)
Occupational Therapy Evaluation Patient Details Name: Kelsey Woods MRN: [id number] DOB: 08-23-1951 Today's Date: 06/15/2018    History of Present Illness 67yo female POD#0 s/p L reverse TSA.    Clinical Impression   Patient was seen for an OT evaluation this date, POD#0. Pt alert and oriented, agreeable to OT. Spouse present for evaluation. Pt lives with her spouse as well as her daughter, son in law and grand kids (they are temporarily living on 2nd floor of the home). Pt has excellent family support upon return home. Prior to surgery, pt was active and independent, however experiencing increased difficulty with ADL tasks and driving 2/2 to L shoulder pain and dysfunction. Pt is eager to return to PLOF with improved comfort and independence. Pt has orders for LUE to be immobilized and will be NWBing per MD. Pt presents with impaired strength/ROM, pain, and sensation to LUE with block not completely resolved yet. These impairments result in a decreased ability to perform self care tasks requiring min assist for UB/LB dressing and bathing and max assist for application of polar care, compression stockings, and sling/immobilizer. Pt/spouse instructed in polar care mgt, compression stockings mgt, sling/immobilizer mgt, ROM exercises for LUE (with instructions for no shoulder exercises until full sensation has returned), LUE precautions, adaptive strategies for bathing/dressing/toileting/grooming, positioning and considerations for sleep, and home/routines modifications to maximize falls prevention, safety, and independence. Verbal instruction, visual demonstration, and handout provided. OT adjusted sling/immobilizer and polar care to improve comfort, optimize positioning, and to maximize skin integrity/safety (towel under polar care noted to be displaced slightly). Pt/spouse verbalized understanding of all education/training provided. Pt will benefit from skilled OT services while in the hospital to  address these limitations and improve independence in daily tasks. Follow up therapy as arranged by surgeon.     Follow Up Recommendations  Follow surgeon's recommendation for DC plan and follow-up therapies    Equipment Recommendations  None recommended by OT    Recommendations for Other Services       Precautions / Restrictions Precautions Precautions: Fall;Shoulder Shoulder Interventions: Shoulder sling/immobilizer;Shoulder abduction pillow;At all times;Off for dressing/bathing/exercises Precaution Booklet Issued: Yes (comment) Required Braces or Orthoses: Sling Restrictions Weight Bearing Restrictions: Yes LUE Weight Bearing: Non weight bearing      Mobility Bed Mobility Overal bed mobility: Modified Independent             General bed mobility comments: able to perform with additional effort and time, cautious with LUE, but no assist required, instructed to get out on R side of bed at home to improve independence/safety  Transfers Overall transfer level: Needs assistance Equipment used: None Transfers: Sit to/from Stand Sit to Stand: Min guard         General transfer comment: good effort, no LOB    Balance Overall balance assessment: Mild deficits observed, not formally tested(no overt LOB, may benefit from [facility] for ambulation to maximize balance)                                         ADL either performed or assessed with clinical judgement   ADL Overall ADL's : Needs assistance/impaired Eating/Feeding: Sitting;Modified independent   Grooming: Standing;Modified independent Grooming Details (indicate cue type and reason): min assist for underarm grooming, pt/spouse instructed in modified techniques to perform while maintaining shoulder precautions Upper Body Bathing: Minimal assistance;With caregiver independent assisting Upper Body Bathing Details (indicate cue type  and reason): pt/spouse instructed in modified techniques to perform  underarm bathing while maintaining shoulder precautions Lower Body Bathing: Sit to/from stand;Minimal assistance;With caregiver independent assisting   Upper Body Dressing : Sitting;Minimal assistance;With caregiver independent assisting Upper Body Dressing Details (indicate cue type and reason): pt/spouse instructed in hemi techniques to perform UB dressing while maintaining shoulder precautions Lower Body Dressing: Sit to/from stand;Minimal assistance;With caregiver independent assisting   Toilet Transfer: BSC;Min guard;Ambulation Toilet Transfer Details (indicate cue type and reason): pt may benefit from use of SPC to improve stability next session Toileting- Clothing Manipulation and Hygiene: Modified independent;Sitting/lateral lean       Functional mobility during ADLs: Min guard       Vision Baseline Vision/History: Wears glasses Wears Glasses: At all times Patient Visual Report: No change from baseline       Perception     Praxis      Pertinent Vitals/Pain Pain Assessment: 0-10("discomfort" in L shoulder but would not give a number) Pain Descriptors / Indicators: Discomfort Pain Intervention(s): Ice applied;Patient requesting pain meds-RN notified;Monitored during session;Limited activity within patient's tolerance;Repositioned     Hand Dominance Right   Extremity/Trunk Assessment Upper Extremity Assessment Upper Extremity Assessment: LUE deficits/detail(RUE WFL) LUE Deficits / Details: full AROM of hand/wrist, full PROM of elbow, block still in place LUE: Unable to fully assess due to pain;Unable to fully assess due to immobilization LUE Sensation: decreased light touch;decreased proprioception LUE Coordination: decreased fine motor;decreased gross motor   Lower Extremity Assessment Lower Extremity Assessment: Defer to PT evaluation;Overall [facility] for tasks assessed   Cervical / Trunk Assessment Cervical / Trunk Assessment: Normal   Communication  Communication Communication: No difficulties   Cognition Arousal/Alertness: Awake/alert Behavior During Therapy: WFL for tasks assessed/performed Overall Cognitive Status: Within Functional Limits for tasks assessed                                     General Comments  polar care and L shoulder sling adjusted to maximize optimal positioning and minimize skin breakdown    Exercises Other Exercises Other Exercises: Pt/spouse instructed in polar care mgt including wear schedule, positioning, and donning/doffing with verbal instruction, visual demo, and handout to reference Other Exercises: Pt/spouse instructed in sholder sling/immobilizer including wear schedule, positioning, and donning/doffing with verbal instruction, visual demo, and handout to reference Other Exercises: Pt/spouse instructed in compression stocking mgt Other Exercises: Pt/spouse instructed in elbow, wrist, and hand ROM exercises to perform when out of the sling (NO PROM/AROM of shoulder until full sensation returns AND as instructed by therapist) Other Exercises: Pt/spouse instructed in falls prevention strategies including footwear and pet care considerations to minimize falls risk   Shoulder Instructions      Home Living Family/patient expects to be discharged to:: Private residence Living Arrangements: Spouse/significant other;Children(dtr, son in law, and g kids staying as well ) Available Help at Discharge: Family;Available 24 hours/day Type of Home: House Home Access: Ramped entrance     Home Layout: Two level;Able to live on main level with bedroom/bathroom;Full bath on main level     Bathroom Shower/Tub: Tub/shower unit   Bathroom Toilet: Handicapped height     Home Equipment: Environmental consultant - 2 wheels;Cane - single point;Bedside commode          Prior Functioning/Environment Level of Independence: Independent        Comments: Pt indep with mobility, having increasing difficulty with UB  dressing and bathing  2/2 L shoulder pain/decr ROM, also having increased difficulty with driving to pick up her grandkids from school. 1 falls in past 12 months.        OT Problem List: Decreased strength;Decreased knowledge of use of DME or AE;Decreased range of motion;Impaired UE functional use;Pain;Impaired sensation      OT Treatment/Interventions: Self-care/ADL training;Therapeutic exercise;Therapeutic activities;DME and/or AE instruction;Patient/family education    OT Goals(Current goals can be found in the care plan section) Acute Rehab OT Goals Patient Stated Goal: to be able to pick up my 2yo granddaughter OT Goal Formulation: With patient/family Time For Goal Achievement: 06/29/18 Potential to Achieve Goals: Good ADL Goals Pt Will Perform Upper Body Dressing: with caregiver independent in assisting;with set-up;sitting(using learned hemi techniques) Pt Will Transfer to Toilet: with supervision;ambulating(BSC over toilet) Additional ADL Goal #1: Pt will independently instruct spouse in polar care mgt including positioning, wear schedule, and donning/doffing Additional ADL Goal #2: Pt will independently instruct spouse in shoulder sling/immobilizer including positioning, wear schedule, and donning/doffing  OT Frequency: Min 1X/week   Barriers to D/C:            Co-evaluation              AM-PAC PT "6 Clicks" Daily Activity     Outcome Measure Help from another person eating meals?: None Help from another person taking care of personal grooming?: None Help from another person toileting, which includes using toliet, bedpan, or urinal?: A Little Help from another person bathing (including washing, rinsing, drying)?: A Little Help from another person to put on and taking off regular upper body clothing?: A Little Help from another person to put on and taking off regular lower body clothing?: A Little 6 Click Score: 20   End of Session Equipment Utilized During  Treatment: Gait belt  Activity Tolerance: Patient tolerated treatment well Patient left: in bed;with call bell/phone within reach;with bed alarm set;with family/visitor present;Other (comment)(polar care and sling in place, SCDs off - PT in to evaluate)  OT Visit Diagnosis: Other abnormalities of gait and mobility (R26.89)                Time: 4235-3614 OT Time Calculation (min): 50 min Charges:  OT General Charges $OT Visit: 1 Visit OT Evaluation $OT Eval Low Complexity: 1 Low OT Treatments $Self Care/Home Management : 38-52 mins  Jeni Salles, MPH, MS, OTR/L ascom [phone] 06/15/18, 3:55 PM

## 2018-06-15 NOTE — Anesthesia Preprocedure Evaluation (Addendum)
Anesthesia Evaluation  Patient identified by MRN, date of birth, ID band Patient awake    Reviewed: Allergy & Precautions, H&P , NPO status , Patient's Chart, lab work & pertinent test results, reviewed documented beta blocker date and time   History of Anesthesia Complications Negative for: history of anesthetic complications  Airway Mallampati: II  TM Distance: >3 FB Neck ROM: full    Dental  (+) Caps, Dental Advidsory Given, Missing   Pulmonary neg shortness of breath, sleep apnea , neg COPD, neg recent URI,           Cardiovascular Exercise Tolerance: Good negative cardio ROS       Neuro/Psych PSYCHIATRIC DISORDERS Anxiety negative neurological ROS     GI/Hepatic Neg liver ROS, GERD  ,  Endo/Other  neg diabetesHypothyroidism Morbid obesity  Renal/GU CRFRenal disease  negative genitourinary   Musculoskeletal   Abdominal   Peds  Hematology negative hematology ROS (+)   Anesthesia Other Findings Past Medical History: No date: Anxiety No date: Arthritis No date: Complication of anesthesia     Comment:  swelling of lips after gallblader surgery No date: GERD (gastroesophageal reflux disease) No date: Hypothyroidism No date: Sleep apnea   Reproductive/Obstetrics negative OB ROS                             Anesthesia Physical  Anesthesia Plan  ASA: III  Anesthesia Plan: General   Post-op Pain Management:  Regional for Post-op pain   Induction: Intravenous  PONV Risk Score and Plan: 3 and Ondansetron and Dexamethasone  Airway Management Planned: Oral ETT  Additional Equipment:   Intra-op Plan:   Post-operative Plan: Extubation in OR  Informed Consent: I have reviewed the patients History and Physical, chart, labs and discussed the procedure including the risks, benefits and alternatives for the proposed anesthesia with the patient or authorized representative who has  indicated his/her understanding and acceptance.   Dental Advisory Given  Plan Discussed with: Anesthesiologist, CRNA and Surgeon  Anesthesia Plan Comments: (Discussed the risks and benefits of the interscalene block which includes but is not limited to pneumothorax, infection, nerve damage, horners, difficulty breathing bleeding etc..  Patient understands risks and agrees to the block.)       Anesthesia Quick Evaluation

## 2018-06-15 NOTE — Anesthesia Post-op Follow-up Note (Signed)
Anesthesia QCDR form completed.        

## 2018-06-15 NOTE — Anesthesia Procedure Notes (Signed)
Procedure Name: Intubation Date/Time: 06/15/2018 8:01 AM Performed by: Eben Burow, CRNA Pre-anesthesia Checklist: Patient identified, Emergency Drugs available, Suction available, Patient being monitored and Timeout performed Patient Re-evaluated:Patient Re-evaluated prior to induction Oxygen Delivery Method: Circle system utilized Preoxygenation: Pre-oxygenation with 100% oxygen Induction Type: IV induction Ventilation: Mask ventilation without difficulty Laryngoscope Size: Miller and 2 Grade View: Grade I Tube type: Oral Tube size: 7.0 mm Number of attempts: 1 Airway Equipment and Method: Stylet and LTA kit utilized Placement Confirmation: ETT inserted through vocal cords under direct vision,  positive ETCO2 and breath sounds checked- equal and bilateral Secured at: 22 cm Tube secured with: Tape Dental Injury: Teeth and Oropharynx as per pre-operative assessment

## 2018-06-15 NOTE — Transfer of Care (Signed)
Immediate Anesthesia Transfer of Care Note  Patient: Kelsey Woods  Procedure(s) Performed: TOTAL .REVERSE SHOULDER ARTHROPLASTY (Left Shoulder)  Patient Location: PACU  Anesthesia Type:General  Level of Consciousness: awake, alert , oriented and patient cooperative  Airway & Oxygen Therapy: Patient Spontanous Breathing and Patient connected to face mask oxygen  Post-op Assessment: Report given to RN and Post -op Vital signs reviewed and stable  Post vital signs: Reviewed and stable  Last Vitals:  Vitals Value Taken Time  BP 144/110 06/15/2018 10:30 AM  Temp    Pulse 108 06/15/2018 10:31 AM  Resp 16 06/15/2018 10:31 AM  SpO2 96 % 06/15/2018 10:31 AM  Vitals shown include unvalidated device data.  Last Pain:  Vitals:   06/15/18 0745  TempSrc:   PainSc: 1          Complications: No apparent anesthesia complications

## 2018-06-15 NOTE — Evaluation (Signed)
Physical Therapy Evaluation Patient Details Name: Kelsey Woods MRN: [id number] DOB: [birth year] Today's Date: 06/15/2018   History of Present Illness  67yo female POD#0 s/p L reverse TSA.   Clinical Impression  Pt is a pleasant 67 year old female who was admitted for s/p L reverse TSA. Pt was willing to work with PT after some encouragement. Pt performs bed mobility with Mod I, transfers sit<>stand with CGA, and ambulates 20' with CGA for safety. See below for details on mobility. Pt demonstrates deficits with functional mobility 2/2 weakness, pain, NWB of L UE, and diminished balance. Pt would benefit from skilled PT to address above deficits and promote optimal return to PLOF. PT recommends d/c to home with home health. PT will continue to assess pt during hospital stay.     Follow Up Recommendations Home health PT    Equipment Recommendations       Recommendations for Other Services       Precautions / Restrictions Precautions Precautions: Fall;Shoulder Shoulder Interventions: Shoulder sling/immobilizer;Shoulder abduction pillow;At all times;Off for dressing/bathing/exercises Precaution Booklet Issued: Yes (comment) Required Braces or Orthoses: Sling Restrictions Weight Bearing Restrictions: Yes LUE Weight Bearing: Non weight bearing      Mobility  Bed Mobility Overal bed mobility: Modified Independent             General bed mobility comments: able to perform with additional effort and time, cautious with LUE, but no assist required  Transfers Overall transfer level: Needs assistance Equipment used: None Transfers: Sit to/from Stand Sit to Stand: Min guard         General transfer comment: Pt benefited from CGA for safety  Ambulation/Gait Ambulation/Gait assistance: Min guard Gait Distance (Feet): 20 Feet Assistive device: Straight cane       General Gait Details: Pt required CGA with ambulation for unsteadiness and verbal/tactile cueing to  prevent walking into objects with L UE. Pt carried cane at end of ambulation when turning to sit in chair and thus PT recommneds atempting gait with no cane.  Stairs            Wheelchair Mobility    Modified Rankin (Stroke Patients Only)       Balance Overall balance assessment: Mild deficits observed, not formally tested                                           Pertinent Vitals/Pain Pain Assessment: 0-10 Pain Score: 6  Pain Location: Neck at sling strap > shoulder Pain Descriptors / Indicators: Discomfort Pain Intervention(s): Limited activity within patient's tolerance;Monitored during session;Repositioned;Ice applied    Home Living Family/patient expects to be discharged to:: Private residence Living Arrangements: Spouse/significant other;Children Available Help at Discharge: Family;Available 24 hours/day Type of Home: House Home Access: Ramped entrance     Home Layout: Two level;Able to live on main level with bedroom/bathroom;Full bath on main level Home Equipment: Walker - 2 wheels;Cane - single point;Bedside commode      Prior Function Level of Independence: Independent         Comments: Pt indep with mobility, having increasing difficulty with UB dressing and bathing 2/2 L shoulder pain/decr ROM, also having increased difficulty with driving to pick up her grandkids from school. 1 falls in past 12 months.     Hand Dominance   Dominant Hand: Right    Extremity/Trunk Assessment   Upper Extremity Assessment Upper  Extremity Assessment: Generalized weakness(In L UE) LUE Deficits / Details: full AROM of hand/wrist LUE: Unable to fully assess due to pain;Unable to fully assess due to immobilization LUE Sensation: decreased light touch;decreased proprioception LUE Coordination: decreased fine motor;decreased gross motor    Lower Extremity Assessment Lower Extremity Assessment: Overall WFL for tasks assessed    Cervical / Trunk  Assessment Cervical / Trunk Assessment: Normal  Communication   Communication: No difficulties  Cognition Arousal/Alertness: Awake/alert Behavior During Therapy: WFL for tasks assessed/performed Overall Cognitive Status: Within Functional Limits for tasks assessed                                        General Comments General comments (skin integrity, edema, etc.): polar care and L shoulder sling adjusted to maximize optimal positioning and minimize skin breakdown    Exercises Other Exercises Other Exercises: Pt/spouse instructed in polar care mgt including wear schedule, positioning, and donning/doffing with verbal instruction, visual demo, and handout to reference Other Exercises: Pt/spouse instructed in sholder sling/immobilizer including wear schedule, positioning, and donning/doffing with verbal instruction, visual demo, and handout to reference Other Exercises: Pt/spouse instructed in compression stocking mgt Other Exercises: Pt/spouse instructed in elbow, wrist, and hand ROM exercises to perform when out of the sling (NO PROM/AROM of shoulder until full sensation returns AND as instructed by therapist) Other Exercises: Pt/spouse instructed in falls prevention strategies including footwear and pet care considerations to minimize falls risk   Assessment/Plan    PT Assessment Patient needs continued PT services  PT Problem List Decreased strength;Decreased range of motion;Decreased activity tolerance;Decreased balance;Decreased mobility;Decreased coordination;Pain       PT Treatment Interventions DME instruction;Gait training;Stair training;Functional mobility training;Therapeutic activities;Therapeutic exercise;Balance training;Neuromuscular re-education    PT Goals (Current goals can be found in the Care Plan section)  Acute Rehab PT Goals Patient Stated Goal: return to PLOF PT Goal Formulation: With patient Time For Goal Achievement: 06/29/18 Potential to  Achieve Goals: Good    Frequency BID   Barriers to discharge        Co-evaluation               AM-PAC PT "6 Clicks" Daily Activity  Outcome Measure Difficulty turning over in bed (including adjusting bedclothes, sheets and blankets)?: Unable Difficulty moving from lying on back to sitting on the side of the bed? : A Little Difficulty sitting down on and standing up from a chair with arms (e.g., wheelchair, bedside commode, etc,.)?: Unable Help needed moving to and from a bed to chair (including a wheelchair)?: A Little Help needed walking in hospital room?: A Little Help needed climbing 3-5 steps with a railing? : A Little 6 Click Score: 14    End of Session Equipment Utilized During Treatment: Gait belt Activity Tolerance: Patient tolerated treatment well Patient left: in chair;with chair alarm set;with call bell/phone within reach;with SCD's reapplied   PT Visit Diagnosis: Unsteadiness on feet (R26.81);Other abnormalities of gait and mobility (R26.89);Muscle weakness (generalized) (M62.81);Pain;Difficulty in walking, not elsewhere classified (R26.2) Pain - Right/Left: Left Pain - part of body: Shoulder    Time: 1530-1601 PT Time Calculation (min) (ACUTE ONLY): 31 min   Charges:              Rosario Adie, SPT  Rosario Adie 06/15/2018, 4:37 PM

## 2018-06-15 NOTE — Discharge Summary (Signed)
Physician Discharge Summary  Patient ID: Kelsey Woods MRN: [id number] DOB/AGE: 69/26/1952 69 y.o.  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  Discharge Diagnoses: Patient Active Problem List   Diagnosis Date Noted  . 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 Colon cancer screening   . Bilateral carpal tunnel syndrome 03/17/2017  . 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 Lumbar radiculitis 08/14/2014  . DDD (degenerative disc disease), lumbar 08/14/2014  . Sleep apnea 08/07/2014  . Obesity, unspecified 08/07/2014  . History of hypothyroidism 08/07/2014  . Chronic kidney disease 08/07/2014  . Arthritis 08/07/2014    Past Medical History:  Diagnosis Date  . Acute appendicitis with perforation and peritoneal abscess   . Anxiety   . Arthritis   . Complication of anesthesia    swelling of lips after gallblader surgery  . GERD (gastroesophageal reflux disease)   . Hypothyroidism    h/o no meds now  . Ruptured appendicitis 11/28/2017  . Sleep apnea    Transfusion: None.   Consultants (if any):   Discharged Condition: Improved  Hospital Course: Kelsey Woods is an 67 y.o. female who was admitted 06/15/2018 with a diagnosis of left shoulder osteoarthritis with underlying rotator cuff tear and went to the operating room on 06/15/2018 and underwent the above named procedures.    Surgeries: Procedure(s): TOTAL .REVERSE SHOULDER ARTHROPLASTY on 06/15/2018 Patient tolerated the surgery well. Taken to PACU where she was stabilized and then transferred to the orthopedic floor.  Started on Lovenox 48m q 24 hrs. Foot pumps applied bilaterally at 80 mm. Heels elevated on bed with rolled towels. No evidence of DVT. Negative Homan. Physical therapy started on day #1 for gait training and transfer. OT started day #1 for ADL and assisted  devices.  Patient's IV was removed on POD1.  Implants: All press-fit Biomet Comprehensive system with a #8 micro-humeral stem, a 40 mm humeral tray with a standard insert, and a mini-base plate with a 36 mm glenosphere.  She was given perioperative antibiotics:  Anti-infectives (From admission, onward)   Start     Dose/Rate Route Frequency Ordered Stop   06/15/18 1200  ceFAZolin (ANCEF) 3 g in dextrose 5 % 50 mL IVPB     3 g 100 mL/hr over 30 Minutes Intravenous Every 6 hours 06/15/18 1146 06/15/18 2349   06/15/18 0300  ceFAZolin (ANCEF) 3 g in dextrose 5 % 50 mL IVPB     3 g 100 mL/hr over 30 Minutes Intravenous  Once 06/15/18 0247 06/15/18 0820    .  She was given sequential compression devices, early ambulation, and Lovenox for DVT prophylaxis.  She benefited maximally from the hospital stay and there were no complications.    Recent vital signs:  Vitals:   06/15/18 1936 06/15/18 2321  BP: 119/72 119/61  Pulse: 92 67  Resp: 19 19  Temp: 98.6 F (37 C) 98 F (36.7 C)  SpO2: 94% 96%    Recent laboratory studies:  Lab Results  Component Value Date   HGB 11.4 (L) 06/16/2018   HGB 14.0 06/01/2018   HGB 14.4 04/05/2018   Lab Results  Component Value Date   WBC 9.9 06/16/2018   PLT 172 06/16/2018   Lab Results  Component Value Date   INR 1.09 06/01/2018   Lab Results  Component Value Date   NA 143 06/16/2018   K 4.0 06/16/2018   CL  108 06/16/2018   CO2 29 06/16/2018   BUN 15 06/16/2018   CREATININE 0.74 06/16/2018   GLUCOSE 129 (H) 06/16/2018    Discharge Medications:   Allergies as of 06/16/2018      Reactions   Naproxen Sodium Swelling   Alpha-gal Nausea And Vomiting, Swelling   Sulfa Antibiotics    Eye drop only, can take oral. Caused eyes to itch and turn red       Medication List    TAKE these medications   acetaminophen 500 MG tablet Commonly known as:  TYLENOL Take 500 mg by mouth 3 (three) times daily as needed for moderate pain or  headache.   amoxicillin 500 MG capsule Commonly known as:  AMOXIL Take 2,000 mg by mouth See admin instructions. Take 2000 mg by mouth 1 hour prior to dental work   aspirin EC 81 MG tablet Take 81 mg by mouth daily.   cetirizine 10 MG tablet Commonly known as:  ZYRTEC Take 10 mg by mouth daily.   EPINEPHrine 0.3 mg/0.3 mL Soaj injection Commonly known as:  EPI-PEN Inject 0.3 mg into the muscle Once PRN (for allergic reactions).   esomeprazole 20 MG capsule Commonly known as:  NEXIUM Take 20 mg by mouth daily.   fluticasone 50 MCG/ACT nasal spray Commonly known as:  FLONASE Place 2 sprays into both nostrils daily as needed for allergies.   LORazepam 0.5 MG tablet Commonly known as:  ATIVAN Take 0.5 mg by mouth at bedtime as needed for sleep.   meloxicam 7.5 MG tablet Commonly known as:  MOBIC Take 7.5 mg by mouth 2 (two) times daily.   oxyCODONE 5 MG immediate release tablet Commonly known as:  Oxy IR/ROXICODONE Take 1-2 tablets (5-10 mg total) by mouth every 4 (four) hours as needed for moderate pain.   SYSTANE BALANCE 0.6 % Soln Generic drug:  Propylene Glycol Place 1 drop into both eyes 2 (two) times daily. May use 1 drop in both eyes an additional 2 times a day as needed for dry eyes   tiZANidine 4 MG tablet Commonly known as:  ZANAFLEX Take 1-2 mg by mouth at bedtime as needed for muscle spasms.   traMADol 50 MG tablet Commonly known as:  ULTRAM Take 1 tablet (50 mg total) by mouth every 6 (six) hours as needed (breakthrough pain). What changed:    when to take this  reasons to take this       Diagnostic Studies: Mr Shoulder Left Wo Contrast  Result Date: 06/09/2018 CLINICAL DATA:  Chronic anterior left shoulder pain and limited range of motion. No known injury. EXAM: MRI OF THE LEFT SHOULDER WITHOUT CONTRAST TECHNIQUE: Multiplanar, multisequence MR imaging of the shoulder was performed. No intravenous contrast was administered. COMPARISON:  None.  FINDINGS: Rotator cuff: There is rotator cuff tendinopathy. The patient has a bursal sided tear of the supraspinatus measuring 0.9 cm from front to back. The tear is approximately 1.5 cm medial to the greater tuberosity. Gap between tendon fragments is 1 cm. The rotator cuff is otherwise intact. Muscles:  No focal atrophy or lesion. Biceps long head:  Intact. Acromioclavicular Joint: Mild-to-moderate degenerative change is present. Type 1 acromion. No subacromial/subdeltoid bursal fluid. Glenohumeral Joint: The patient has advanced glenohumeral osteoarthritis. Cartilage is completely denuded. Mild subchondral edema is seen about the joint. The glenoid is remodeled and there is a large osteophyte off the humeral head. Labrum:  Appears intact. Bones:  No fracture or worrisome lesion. Other: None. IMPRESSION: Dominant finding  is advanced glenohumeral osteoarthritis. Rotator cuff tendinopathy with a 0.9 cm from front to back bursal sided tear of the supraspinatus. The tear is approximately 1.5 cm medial to the greater tuberosity and gap between tendon fragments is 1 cm. Mild to moderate acromioclavicular osteoarthritis. Electronically Signed   By: Inge Rise M.D.   On: 06/09/2018 13:28   Dg Shoulder Left Port  Result Date: 06/15/2018 CLINICAL DATA:  Status post reverse arthroplasty of left shoulder. EXAM: LEFT SHOULDER - 1 VIEW COMPARISON:  None. FINDINGS: The humeral and glenoid components appear to be well situated. No dislocation is noted. Expected postoperative changes are seen in the surrounding soft tissues. IMPRESSION: Status post left shoulder arthroplasty. Electronically Signed   By: Marijo Conception, M.D.   On: 06/15/2018 13:03   Disposition:  Plan will be for discharge home on 06/16/18 pending progress with PT and pain levels.  Follow-up Information    Lattie Corns, PA-C Follow up in 14 day(s).   Specialty:  Physician Assistant Why:  Levert Feinstein Removal Contact information: [place] [address] [phone]          Signed: Judson Roch PA-C 06/16/2018, 7:08 AM

## 2018-06-15 NOTE — H&P (Signed)
Paper H&P to be scanned into permanent record. H&P reviewed and patient re-examined. No changes. 

## 2018-06-15 NOTE — Op Note (Signed)
06/15/2018  10:32 AM  Patient:   Kelsey Woods  Pre-Op Diagnosis:   Primary osteoarthritis with rotator cuff tear, left shoulder.  Post-Op Diagnosis:   Same  Procedure:   Reverse left total shoulder arthroplasty.  Surgeon:   Pascal Lux, MD  Assistant:   Cameron Proud, PA-C  Anesthesia:   General endotracheal with an interscalene block placed preoperatively by the anesthesiologist.  Findings:   As above.  Complications:   None  EBL:    300 cc  Fluids:   500 cc crystalloid  UOP:   None  TT:   None  Drains:   None  Closure:   Staples  Implants:   All press-fit Biomet Comprehensive system with a #8 micro-humeral stem, a 40 mm humeral tray with a standard insert, and a mini-base plate with a 36 mm glenosphere.  Brief Clinical Note:   The patient is a 67 year old female with a long history of gradually worsening left shoulder pain. Her symptoms have progressed despite medications, activity modification, injections, etc. Her history and examination are consistent with advanced degenerative joint disease confirmed by plain radiographs. An MRI scan demonstrated significant partial-thickness tearing of the supraspinatus tendon. The patient presents at this time for a reverse left total shoulder arthroplasty.  Procedure:   The patient underwent placement of an interscalene block by the anesthesiologist in the preoperative holding area before being brought into the operating room and lain in the supine position. The patient then underwent general endotracheal intubation and anesthesia before the patient was repositioned in the beach chair position using the beach chair positioner. The left shoulder and upper extremity were prepped with ChloraPrep solution before being draped sterilely. Preoperative antibiotics were administered. A standard anterior approach to the shoulder was made through an approximately 4-5 inch incision. The incision was carried down through the subcutaneous  tissues to expose the deltopectoral fascia. The interval between the deltoid and pectoralis muscles was identified and this plane developed, retracting the cephalic vein laterally with the deltoid muscle. The conjoined tendon was identified. Its lateral margin was dissected and the Kolbel self-retraining retractor inserted. The "three sisters" were identified and cauterized. Bursal tissues were removed to improve visualization. The subscapularis tendon was released from its attachment to the lesser tuberosity 1 cm proximal to its insertion and several tagging sutures placed. The inferior capsule was released with care after identifying and protecting the axillary nerve. The proximal humeral cut was made at approximately 25 of retroversion using the extra-medullary guide.   Attention was redirected to the glenoid. The labrum was debrided circumferentially before the center of the glenoid was marked with electrocautery. The guidewire was drilled into the glenoid neck using the appropriate guide. After verifying its position, it was overreamed with the mini-baseplate reamer to create a flat surface. The permanent mini-baseplate was impacted into place. It was stabilized with a 35 x 6.5 mm central screw and four peripheral screws. Locking screws were placed superiorly and inferiorly while nonlocking screws were placed anteriorly and posteriorly. The permanent 36 mm glenosphere was then impacted into place and its Morse taper locking mechanism verified using manual distraction.  Attention was directed to the humeral side. The humeral canal was reamed sequentially beginning with the end-cutting reamer then progressing from a 4 mm reamer up to an 8 mm reamer. This provided excellent circumferential chatter. The canal was broached beginning with a #5 broach and progressing to a #8 broach. This was left in place and a trial reduction performed using the  standard trial humeral platform. The arm demonstrated excellent  range of motion as the hand could be brought across the chest to the opposite shoulder and brought to the top of the patient's head and to the patient's ear. The shoulder appeared stable throughout this range of motion. The joint was dislocated and the trial components removed. The permanent #8 micro-stem was impacted into place with care taken to maintain the appropriate version. The permanent 40 mm humeral platform with the standard insert was put together on the back table and impacted into place. Again, the [facility] taper locking mechanism was verified using manual distraction. The shoulder was relocated using two finger pressure and again placed through a range of motion with the findings as described above.  The wound was copiously irrigated with bacitracin saline solution using the jet lavage system before a total of 20 cc of Exparel diluted out to 60 cc with normal saline and 30 cc of 0.5% Sensorcaine with epinephrine was injected into the pericapsular and peri-incisional tissues to help with postoperative analgesia. The subscapularis tendon was reapproximated using #2 FiberWire interrupted sutures. The deltopectoral interval was closed using #0 Vicryl interrupted sutures before the subcutaneous tissues were closed using 2-0 Vicryl interrupted sutures. The skin was closed using staples. Prior to closing the skin, 1 g of transexemic acid in 10 cc of normal saline was injected intra-articularly to help with postoperative bleeding. A sterile occlusive dressing was applied to the wound before the arm was placed into a shoulder immobilizer with an abduction pillow. A Polar Care system also was applied to the shoulder. The patient was then transferred back to a hospital bed before being awakened, extubated, and returned to the recovery room in satisfactory condition after tolerating the procedure well.

## 2018-06-16 ENCOUNTER — Encounter: Payer: Self-pay | Admitting: Surgery

## 2018-06-16 LAB — BASIC METABOLIC PANEL
Anion gap: 6 (ref 5–15)
BUN: 15 mg/dL (ref 8–23)
CHLORIDE: 108 mmol/L (ref 98–111)
CO2: 29 mmol/L (ref 22–32)
Calcium: 8.5 mg/dL — ABNORMAL LOW (ref 8.9–10.3)
Creatinine, Ser: 0.74 mg/dL (ref 0.44–1.00)
Glucose, Bld: 129 mg/dL — ABNORMAL HIGH (ref 70–99)
POTASSIUM: 4 mmol/L (ref 3.5–5.1)
SODIUM: 143 mmol/L (ref 135–145)

## 2018-06-16 LAB — BPAM RBC
Blood Product Expiration Date: 201908292359
Blood Product Expiration Date: 201908302359
UNIT TYPE AND RH: 6200
Unit Type and Rh: 6200

## 2018-06-16 LAB — TYPE AND SCREEN
ABO/RH(D): A POS
Antibody Screen: NEGATIVE
UNIT DIVISION: 0
UNIT DIVISION: 0

## 2018-06-16 LAB — CBC WITH DIFFERENTIAL/PLATELET
BASOS ABS: 0 10*3/uL (ref 0–0.1)
BASOS PCT: 0 %
EOS ABS: 0 10*3/uL (ref 0–0.7)
Eosinophils Relative: 0 %
HCT: 32.9 % — ABNORMAL LOW (ref 35.0–47.0)
HEMOGLOBIN: 11.4 g/dL — AB (ref 12.0–16.0)
Lymphocytes Relative: 9 %
Lymphs Abs: 0.9 10*3/uL — ABNORMAL LOW (ref 1.0–3.6)
MCH: 33.8 pg (ref 26.0–34.0)
MCHC: 34.8 g/dL (ref 32.0–36.0)
MCV: 97.1 fL (ref 80.0–100.0)
Monocytes Absolute: 0.7 10*3/uL (ref 0.2–0.9)
Monocytes Relative: 7 %
NEUTROS PCT: 84 %
Neutro Abs: 8.3 10*3/uL — ABNORMAL HIGH (ref 1.4–6.5)
Platelets: 172 10*3/uL (ref 150–440)
RBC: 3.39 MIL/uL — AB (ref 3.80–5.20)
RDW: 13.1 % (ref 11.5–14.5)
WBC: 9.9 10*3/uL (ref 3.6–11.0)

## 2018-06-16 MED ORDER — TRAMADOL HCL 50 MG PO TABS: 50.0000 mg | ORAL_TABLET | Freq: Four times a day (QID) | ORAL | 0 refills | Status: DC | PRN

## 2018-06-16 MED ORDER — OXYCODONE HCL 5 MG PO TABS: 5.0000 mg | ORAL_TABLET | ORAL | 0 refills | Status: DC | PRN

## 2018-06-16 NOTE — Progress Notes (Signed)
Physical Therapy Treatment Patient Details Name: Kelsey Woods MRN: [id number] DOB: 02-07-1951 Today's Date: 06/16/2018    History of Present Illness 67yo female POD#0 s/p L reverse TSA. PMH includes anxiety, hypothyroidism, GERD, morbid obesity and renal disease.    PT Comments    Pt is progressing well towards goals today. Pt was able to preform bed mobility with Mod I, transfer sit<>stand with supervision, and ambulate 200' with CGA for safety see below for details. Pt had no increased pain during treatment, and needed no verbal cueing to avoid bumping L UE into objects. Pt was able to perform L UE there.ex with only physical assist for elbow flex/ext see below for details. Current d/c plan remains appropriate at this time. Pt plans to d/c today 8/9.  Follow Up Recommendations  Home health PT     Equipment Recommendations  None recommended by PT    Recommendations for Other Services       Precautions / Restrictions Precautions Precautions: Fall;Shoulder Shoulder Interventions: Shoulder sling/immobilizer;Shoulder abduction pillow;At all times;Off for dressing/bathing/exercises Precaution Booklet Issued: Yes (comment) Required Braces or Orthoses: Sling Restrictions Weight Bearing Restrictions: Yes LUE Weight Bearing: Non weight bearing    Mobility  Bed Mobility Overal bed mobility: Modified Independent             General bed mobility comments: able to perform with additional effort and time, cautious with LUE, but no assist required  Transfers Overall transfer level: Needs assistance Equipment used: None Transfers: Sit to/from Stand Sit to Stand: Supervision         General transfer comment: Pt benefited from super vision for safety and increased time  Ambulation/Gait Ambulation/Gait assistance: Min guard Gait Distance (Feet): 200 Feet Assistive device: None   Gait velocity: 10 feet in 11 sec Gait velocity interpretation: <1.8 ft/sec, indicate of  risk for recurrent falls General Gait Details: Pt required CGA to ambulate 200' for unsteadiness.  Pt has wide base of support and limited knee/hip flexion for gait. Pt had forward lean throughout ambulation. Pt required no verbal cueing to avoid objects with L UE.   Stairs             Wheelchair Mobility    Modified Rankin (Stroke Patients Only)       Balance Overall balance assessment: Mild deficits observed, not formally tested                                          Cognition Arousal/Alertness: Awake/alert Behavior During Therapy: WFL for tasks assessed/performed Overall Cognitive Status: Within Functional Limits for tasks assessed                                        Exercises Other Exercises Other Exercises: Pt instructed in seated there.ex to include hand open/close x10, wrist flex/ext x10, upper trap stretch for 10 sec hold x 5 B, scapular retraction x10, and AAROM elbow flex/ext x10 all L UE except scapular and trap stretch.    General Comments        Pertinent Vitals/Pain Pain Assessment: 0-10 Pain Score: 3  Pain Location: L shoulder and Neck Pain Descriptors / Indicators: Discomfort Pain Intervention(s): Limited activity within patient's tolerance;Monitored during session;Ice applied    Home Living  Prior Function            PT Goals (current goals can now be found in the care plan section) Acute Rehab PT Goals Patient Stated Goal: return to PLOF PT Goal Formulation: With patient Time For Goal Achievement: 06/29/18 Potential to Achieve Goals: Good Progress towards PT goals: Progressing toward goals    Frequency    BID      PT Plan Current plan remains appropriate    Co-evaluation              AM-PAC PT "6 Clicks" Daily Activity  Outcome Measure  Difficulty turning over in bed (including adjusting bedclothes, sheets and blankets)?: Unable Difficulty moving from  lying on back to sitting on the side of the bed? : A Little Difficulty sitting down on and standing up from a chair with arms (e.g., wheelchair, bedside commode, etc,.)?: A Little Help needed moving to and from a bed to chair (including a wheelchair)?: A Little Help needed walking in hospital room?: A Little Help needed climbing 3-5 steps with a railing? : A Little 6 Click Score: 16    End of Session Equipment Utilized During Treatment: Gait belt Activity Tolerance: Patient tolerated treatment well Patient left: in chair;with chair alarm set;with call bell/phone within reach;with SCD's reapplied Nurse Communication: Mobility status PT Visit Diagnosis: Unsteadiness on feet (R26.81);Other abnormalities of gait and mobility (R26.89);Muscle weakness (generalized) (M62.81);Pain;Difficulty in walking, not elsewhere classified (R26.2) Pain - Right/Left: Left Pain - part of body: Shoulder     Time: 1601-0932 PT Time Calculation (min) (ACUTE ONLY): 24 min  Charges:                        Rosario Adie, SPT    Rosario Adie 06/16/2018, 10:44 AM

## 2018-06-16 NOTE — Progress Notes (Signed)
Clinical Social Worker (CSW) received SNF consult. PT is recommending home health. RN case manager aware of above. Please reconsult if future social work needs arise. CSW signing off.   Soila Printup, LCSW [phone] 

## 2018-06-16 NOTE — Progress Notes (Signed)
  Subjective: 1 Day Post-Op Procedure(s) (LRB): TOTAL .REVERSE SHOULDER ARTHROPLASTY (Left) Patient reports pain as mild.   Patient is well, and has had no acute complaints or problems Plan is to go Home after hospital stay. Negative for chest pain and shortness of breath Fever: no Gastrointestinal:Negative for nausea and vomiting  Objective: Vital signs in last 24 hours: Temp:  [97 F (36.1 C)-98.6 F (37 C)] 98 F (36.7 C) (08/08 2321) Pulse Rate:  [52-104] 67 (08/08 2321) Resp:  [11-19] 19 (08/08 2321) BP: (91-131)/(51-93) 119/61 (08/08 2321) SpO2:  [92 %-100 %] 96 % (08/08 2321) FiO2 (%):  [28 %] 28 % (08/08 1147)  Intake/Output from previous day:  Intake/Output Summary (Last 24 hours) at 06/16/2018 0705 Last data filed at 06/16/2018 0541 Gross per 24 hour  Intake 2310 ml  Output 300 ml  Net 2010 ml    Intake/Output this shift: No intake/output data recorded.  Labs: Recent Labs    06/16/18 0531  HGB 11.4*   Recent Labs    06/16/18 0531  WBC 9.9  RBC 3.39*  HCT 32.9*  PLT 172   Recent Labs    06/16/18 0531  NA 143  K 4.0  CL 108  CO2 29  BUN 15  CREATININE 0.74  GLUCOSE 129*  CALCIUM 8.5*   No results for input(s): LABPT, INR in the last 72 hours.   EXAM General - Patient is Alert, Appropriate and Oriented Extremity - ABD soft Sensation intact distally Intact pulses distally Incision: dressing C/D/I No cellulitis present  Pt is intact to light touch to the left arm but reports decreased sensitivity.  Block still in effect to the left arm. Dressing/Incision - clean, dry, no drainage Motor Function - intact, moving foot and toes well on exam.  Abdomen soft with normal BS this AM.  Past Medical History:  Diagnosis Date  . Acute appendicitis with perforation and peritoneal abscess   . Anxiety   . Arthritis   . Complication of anesthesia    swelling of lips after gallblader surgery  . GERD (gastroesophageal reflux disease)   .  Hypothyroidism    h/o no meds now  . Ruptured appendicitis 11/28/2017  . Sleep apnea     Assessment/Plan: 1 Day Post-Op Procedure(s) (LRB): TOTAL .REVERSE SHOULDER ARTHROPLASTY (Left) Active Problems:   Status post reverse arthroplasty of shoulder, left  Estimated body mass index is 42.55 kg/m as calculated from the following:   Height as of this encounter: 5\' 7"  (1.702 m).   Weight as of this encounter: 123.2 kg. Advance diet Up with therapy D/C IV fluids when tolerating po intake.  Labs reviewed this AM. Up with therapy today. Pt is passing gas without pain. Plan will be for discharge home this afternoon pending progress with PT.  DVT Prophylaxis - Lovenox, Foot Pumps and TED hose Non-weightbearing to the left arm.  Raquel Laiyla Slagel, PA-C [facility] 06/16/2018, 7:05 AM

## 2018-06-16 NOTE — Plan of Care (Signed)

## 2018-06-16 NOTE — Discharge Instructions (Signed)
Diet: As you were doing prior to hospitalization   Shower:  May shower but keep the wounds dry, use an occlusive plastic wrap, NO SOAKING IN TUB.  If the bandage gets wet, change with a clean dry gauze.  Dressing:  You may change your dressing as needed. Change the dressing with sterile gauze dressing.    Activity:  Increase activity slowly as tolerated, but follow the weight bearing instructions below.  No lifting or driving for 6 weeks.  Weight Bearing:   Non-weightbearing to the left arm.  Blood Clot Prevention: Take 1 325mg  aspirin daily for the first 14 days following surgery. For one week take Meloxicam only once daily.  To prevent constipation: you may use a stool softener such as -  Colace (over the counter) 100 mg by mouth twice a day  Drink plenty of fluids (prune juice may be helpful) and high fiber foods Miralax (over the counter) for constipation as needed.    Itching:  If you experience itching with your medications, try taking only a single pain pill, or even half a pain pill at a time.  You may take up to 10 pain pills per day, and you can also use benadryl over the counter for itching or also to help with sleep.   Precautions:  If you experience chest pain or shortness of breath - call 911 immediately for transfer to the hospital emergency department!!  If you develop a fever greater that 101 F, purulent drainage from wound, increased redness or drainage from wound, or calf pain-Call [place]                                              Follow- Up Appointment:  Please call for an appointment to be seen in 2 weeks at [facility]

## 2018-06-16 NOTE — Progress Notes (Addendum)
Patient is being discharged to home with [place] nurse discharging patient and removing IV. NT to help prepare patient to go home and pack belongings. AVS & RX given to patient.

## 2018-06-16 NOTE — Care Management Note (Signed)
Case Management Note  Patient Details  Name: Kelsey Woods MRN: [id number] Date of Birth: [birth year]  Subjective/Objective:   Met with patient at bedside to discuss transitions of care. She lives at home with her spouse who will assist her at discharge. [facility] prefers [facility] for home health services. Will need HHPT and HHOT. Referral to [facility]. No DME needs. Home on ASA. Discharging today                 Action/Plan: [facility] for HHPT/ HHOT  Expected Discharge Date:  06/16/18               Expected Discharge Plan:  [place]  In-House Referral:     Discharge planning Services  CM Consult  Post Acute Care Choice:  Home Health Choice offered to:  Patient  DME Arranged:    DME Agency:     HH Arranged:  PT, OT [place] Agency:  [facility] (formerly [employer])  Status of Service:  Completed, signed off  If discussed at [employer], dates discussed:    Additional Comments:  Jolly Mango, RN 06/16/2018, 9:45 AM

## 2018-06-19 LAB — SURGICAL PATHOLOGY

## 2018-06-21 MED ORDER — BUPIVACAINE HCL (PF) 0.5 % IJ SOLN
INTRAMUSCULAR | Status: DC | PRN
  Administered 2018-06-15 – 2018-06-21 (×2): 10 mL

## 2018-06-21 MED ORDER — BUPIVACAINE LIPOSOME 1.3 % IJ SUSP
INTRAMUSCULAR | Status: DC | PRN
  Administered 2018-06-15 – 2018-06-21 (×2): 15 mL

## 2018-06-21 NOTE — Anesthesia Postprocedure Evaluation (Signed)
Anesthesia Post Note  Patient: Kelsey Woods  Procedure(s) Performed: TOTAL .REVERSE SHOULDER ARTHROPLASTY (Left Shoulder)  Patient location during evaluation: PACU Anesthesia Type: General Level of consciousness: awake and alert and oriented Pain management: pain level controlled Vital Signs Assessment: post-procedure vital signs reviewed and stable Respiratory status: spontaneous breathing Cardiovascular status: blood pressure returned to baseline Anesthetic complications: no Comments: No pain in PACU     Last Vitals:  Vitals:   06/15/18 2321 06/16/18 0826  BP: 119/61 112/68  Pulse: 67 68  Resp: 19 18  Temp: 36.7 C 36.5 C  SpO2: 96% 99%    Last Pain:  Vitals:   06/16/18 0817  TempSrc:   PainSc: 4                  Gawain Crombie

## 2018-06-21 NOTE — Anesthesia Procedure Notes (Addendum)
Anesthesia Regional Block: Interscalene brachial plexus block   Pre-Anesthetic Checklist: ,, timeout performed, Correct Patient, Correct Site, Correct Laterality, Correct Procedure, Correct Position, site marked, Risks and benefits discussed,  Surgical consent,  Pre-op evaluation,  At surgeon's request and post-op pain management  Laterality: Left  Prep: chloraprep, alcohol swabs       Needles:  Injection technique: Single-shot  Needle Type: Stimiplex     Needle Length: 5cm  Needle Gauge: 22     Additional Needles:   Procedures:, nerve stimulator,,, ultrasound used (permanent image in chart),,,,   Nerve Stimulator or Paresthesia:  Response: biceps flexion, 0.5 mA,   Additional Responses:   Narrative:  Start time: 06/21/2018 7:20 AM End time: 06/21/2018 7:27 AM Injection made incrementally with aspirations every 5 mL.  Performed by: Personally   Additional Notes: Functioning IV was confirmed and monitors were applied.  A 75mm 22ga Stimuplex needle was used. Sterile prep and drape,hand hygiene and sterile gloves were used.  Negative aspiration and negative test dose prior to incremental administration of local anesthetic. The patient tolerated the procedure well. Easy injection of 15 cc of exparel and 10 cc of 0.5% Marcaine plain.  Prior injection of skin wheal with 1% plain lidocaine lateral to the probe.

## 2018-06-23 ENCOUNTER — Encounter: Payer: Self-pay | Admitting: Surgery

## 2018-07-03 ENCOUNTER — Ambulatory Visit: Payer: Medicare Other | Attending: Surgery

## 2018-07-03 DIAGNOSIS — M6281 Muscle weakness (generalized): Secondary | ICD-10-CM

## 2018-07-03 DIAGNOSIS — G8929 Other chronic pain: Secondary | ICD-10-CM | POA: Insufficient documentation

## 2018-07-03 DIAGNOSIS — M25512 Pain in left shoulder: Secondary | ICD-10-CM | POA: Insufficient documentation

## 2018-07-03 DIAGNOSIS — M25612 Stiffness of left shoulder, not elsewhere classified: Secondary | ICD-10-CM | POA: Insufficient documentation

## 2018-07-04 ENCOUNTER — Ambulatory Visit: Payer: Medicare Other

## 2018-07-04 NOTE — Addendum Note (Signed)
Addended by: Blain Pais on: 07/04/2018 03:37 PM   Modules accepted: Orders

## 2018-07-04 NOTE — Therapy (Signed)
[facility] [address], [place], [address] Phone: [phone]   Fax:  [phone]  Physical Therapy Evaluation  Patient Details  Name: Kelsey Woods MRN: [id number] Date of Birth: [birth year] Referring Provider: Roland Rack MD   Encounter Date: 07/03/2018  PT End of Session - 07/03/18 1424    Visit Number  1    Number of Visits  17    Date for PT Re-Evaluation  08/28/18    PT Start Time  7062    PT Stop Time  1145    PT Time Calculation (min)  60 min    Activity Tolerance  Patient tolerated treatment well    Behavior During Therapy  [facility] for tasks assessed/performed       Past Medical History:  Diagnosis Date  . Acute appendicitis with perforation and peritoneal abscess   . Anxiety   . Arthritis   . Complication of anesthesia    swelling of lips after gallblader surgery  . GERD (gastroesophageal reflux disease)   . Hypothyroidism    h/o no meds now  . Ruptured appendicitis 11/28/2017  . Sleep apnea     Past Surgical History:  Procedure Laterality Date  . APPENDECTOMY    . BREAST BIOPSY Right 1980's   Benign  . CESAREAN SECTION    . CHOLECYSTECTOMY    . COLONOSCOPY WITH PROPOFOL N/A 12/29/2017   Procedure: COLONOSCOPY WITH PROPOFOL;  Surgeon: Lin Landsman, MD;  Location: [facility] ENDOSCOPY;  Service: Gastroenterology;  Laterality: N/A;  . DILATION AND CURETTAGE OF UTERUS    . JOINT REPLACEMENT Bilateral    knees  . JOINT REPLACEMENT Right    hip  . LAPAROSCOPIC APPENDECTOMY N/A 01/24/2018   Procedure: APPENDECTOMY LAPAROSCOPIC;  Surgeon: Olean Ree, MD;  Location: [facility] ORS;  Service: General;  Laterality: N/A;  . NASAL SEPTUM SURGERY    . REPLACEMENT TOTAL KNEE BILATERAL    . REVERSE SHOULDER ARTHROPLASTY Left 06/15/2018   Procedure: TOTAL .REVERSE SHOULDER ARTHROPLASTY;  Surgeon: Corky Mull, MD;  Location: [facility] ORS;  Service: Orthopedics;  Laterality: Left;  . TONSILLECTOMY    .  TOTAL HIP ARTHROPLASTY      There were no vitals filed for this visit.   Subjective Assessment - 07/03/18 1109    Subjective  Patient reports increased lateral arm pain secondary to having total reverse arthoplasty. Patient took a tramadol before therapy today. Patient reports increased pain at night. Patient had increased pain in the shoulder "for years" before the surgery. Patient states decreased ability to raise her arm overhead, reaching, and all arm movements. Patient states increased pain along the lateral aspect of the arm extending from the shoulder to the lateral aspect of the elbow. Patient states she takes decrease in pain with ice and tramadol. Patient states she has degenerative disc disease along her cervical spine. Denies N/T.    Pertinent History  B Knee replacement, appendix excision, R hip Replacement, Reverse total shoulder on the L,     Limitations  Lifting;House hold activities    Diagnostic tests  MRI, Xray    Patient Stated Goals  Improve shoulder function and decrease pain     Currently in Pain?  Yes    Pain Score  3    worst: 9/10; best: 2/10    Pain Location  Shoulder    Pain Orientation  Left    Pain Descriptors / Indicators  Aching;Sharp    Pain Type  Surgical pain  Pain Onset  1 to 4 weeks ago    Pain Frequency  Constant         [facility] PT Assessment - 07/03/18 1100      Assessment   Medical Diagnosis  L shoulder Replacement     Referring Provider  Poggi MD    Onset Date/Surgical Date  06/15/18    Hand Dominance  Right    Next MD Visit  07/31/18    Prior Therapy  Knee - PT      Balance Screen   Has the patient fallen in the past 6 months  No    Has the patient had a decrease in activity level because of a fear of falling?   Yes    Is the patient reluctant to leave their home because of a fear of falling?   No      Home Social worker  Private residence    Living Arrangements  Children;Spouse/significant other    Available Help  at Discharge  Family    Type of [place] entrance    [place] to live on main level with bedroom/bathroom    [place] - 2 wheels;[place] - single point      Prior Function   Level of Independence  Independent    Vocation  Retired    Biomedical scientist  N/A      Cognition   Overall Cognitive Status  Within Functional Limits for tasks assessed      Observation/Other Assessments   Observations  Increased UT guarding presented in sitting, poor scapular mobility       Sensation   Light Touch  Appears Intact      Posture/Postural Control   Posture Comments  Increased FHP and forward rounded shoulders      ROM / Strength   AROM / PROM / Strength  PROM;AROM      AROM   AROM Assessment Site  Cervical    Cervical Flexion  WNL - no pain     Cervical Extension  WNL - no pain     Cervical - Right Side Bend  WNL - no pain     Cervical - Left Side Bend  WNL - no pain     Cervical - Right Rotation  WNL - no pain     Cervical - Left Rotation  WNL - no pain       PROM   PROM Assessment Site  Shoulder    Right/Left Shoulder  Right;Left    Right Shoulder Flexion  160 Degrees    Right Shoulder ABduction  160 Degrees    Right Shoulder Internal Rotation  70 Degrees    Right Shoulder External Rotation  70 Degrees    Left Shoulder Extension  --   No appropriate currently   Left Shoulder Flexion  105 Degrees    Left Shoulder ABduction  80 Degrees    Left Shoulder Internal Rotation  40 Degrees    Left Shoulder External Rotation  10 Degrees    Left Shoulder Horizontal ABduction  --   No appropriate currently   Left Shoulder Horizontal ADduction  --   No appropriate currently     Palpation   Palpation comment  TTP: along supraspinatus, infraspinatus,  teres major/latissimus dorsi       Objective measurements completed on examination: See above findings.    Therapeutic Exercise: Elbow flexion out of sling --  x 15 Wrist  flexion/extension -- x 15  Scapular retraction in sitting -- x 20   Patient demonstrates no increase in pain at the end of the session      PT Education - 07/03/18 1417    Education Details  HEP: Scapular retraction, elbow flexion, wrist flexion/extension outside of sling    Person(s) Educated  Patient    Methods  Explanation;Demonstration    Comprehension  Verbalized understanding;Returned demonstration          PT Long Term Goals - 07/04/18 1424      PT LONG TERM GOAL #1   Title  Patient will be independent with HEP to continue benefits of therapy after discharge.    Baseline  Dependent with exercise performance    Time  6    Period  Weeks    Status  New    Target Date  08/29/18      PT LONG TERM GOAL #2   Title  Patient will be able to achieve 120 deg of shoulder flexion AROM without any increase in pain to better be able to reach a high shelf.    Baseline  Unable to perform AROM    Time  6    Period  Weeks    Status  New    Target Date  08/29/18      PT LONG TERM GOAL #3   Title  Patient will have a worst pain of 1/10 to better be able to perform House hold activities without high level of pain    Baseline  10/10    Time  6    Period  Weeks    Status  New    Target Date  08/29/18             Plan - 07/03/18 1425    Clinical Impression Statement  Patient a is 67 yo right hand dominant female presenting with increased shoulder pain and dysfunction s/p Reverse total shoulder arthroplasty on 06/15/2018. Patient demonstrates L shoulder dysfunction as indicated my increased pain with motion and difficulty with movement of her L UE. Patient demonstrates increased lateral arm and elbow which improved after PROM shoulder mobility. Patient will benefit from further skilled therapy focused on improving limitations to return to prior level of function.     History and Personal Factors relevant to plan of care:  B TKA, THA    Clinical Presentation  Stable    Clinical  Presentation due to:  Pain improving    Clinical Decision Making  Low    Rehab Potential  Good    Clinical Impairments Affecting Rehab Potential  (+) highly motivated (-) chronicity of pain    PT Frequency  2x / week    PT Duration  8 weeks    PT Treatment/Interventions  Therapeutic exercise;Therapeutic activities;Neuromuscular re-education;Patient/family education;Manual techniques;Passive range of motion;Dry needling;ADLs/Self Care Home Management;Cryotherapy;Electrical Stimulation;Ultrasound;Moist Heat;Iontophoresis 4mg /ml Dexamethasone    PT Next Visit Plan  progress strength and endurance    PT Home Exercise Plan  see education section    Consulted and Agree with Plan of Care  Patient       Patient will benefit from skilled therapeutic intervention in order to improve the following deficits and impairments:  Pain, Increased muscle spasms, Decreased strength, Decreased endurance, Decreased range of motion, Decreased activity tolerance, Impaired sensation, Increased fascial restricitons, Decreased coordination, Decreased mobility, Postural dysfunction  Visit Diagnosis: Chronic left shoulder pain  Stiffness of left shoulder, not elsewhere classified  Muscle weakness (  generalized)     Problem List Patient Active Problem List   Diagnosis Date Noted  . 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 Colon cancer screening   . Bilateral carpal tunnel syndrome 03/17/2017  . 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 Lumbar radiculitis 08/14/2014  . DDD (degenerative disc disease), lumbar 08/14/2014  . Sleep apnea 08/07/2014  . Obesity, unspecified 08/07/2014  . History of hypothyroidism 08/07/2014  . Chronic kidney disease 08/07/2014  . Arthritis 08/07/2014    Blythe Stanford, PT DPT 07/04/2018, 3:34 PM  [facility] [address], [place], [address] Phone: [phone]   Fax:   [phone]  Name: KYNADI DRAGOS MRN: [id number] Date of Birth: 1951/09/14

## 2018-07-05 ENCOUNTER — Ambulatory Visit: Payer: Medicare Other

## 2018-07-06 ENCOUNTER — Ambulatory Visit: Payer: Medicare Other

## 2018-07-06 DIAGNOSIS — M25512 Pain in left shoulder: Secondary | ICD-10-CM | POA: Diagnosis not present

## 2018-07-06 DIAGNOSIS — M25612 Stiffness of left shoulder, not elsewhere classified: Secondary | ICD-10-CM

## 2018-07-06 DIAGNOSIS — G8929 Other chronic pain: Secondary | ICD-10-CM

## 2018-07-06 DIAGNOSIS — M6281 Muscle weakness (generalized): Secondary | ICD-10-CM

## 2018-07-06 NOTE — Therapy (Signed)
[place] [facility] [address], [place], [address] Phone: [phone]   Fax:  [phone]  Physical Therapy Treatment  Patient Details  Name: Kelsey Woods MRN: [id number] Date of Birth: [birth year] Referring Provider: Roland Rack MD   Encounter Date: 07/06/2018  PT End of Session - 07/06/18 1619    Visit Number  2    Number of Visits  17    Date for PT Re-Evaluation  08/28/18    PT Start Time  1093    PT Stop Time  1550    PT Time Calculation (min)  33 min    Activity Tolerance  Patient tolerated treatment well    Behavior During Therapy  [facility] for tasks assessed/performed       Past Medical History:  Diagnosis Date  . Acute appendicitis with perforation and peritoneal abscess   . Anxiety   . Arthritis   . Complication of anesthesia    swelling of lips after gallblader surgery  . GERD (gastroesophageal reflux disease)   . Hypothyroidism    h/o no meds now  . Ruptured appendicitis 11/28/2017  . Sleep apnea     Past Surgical History:  Procedure Laterality Date  . APPENDECTOMY    . BREAST BIOPSY Right 1980's   Benign  . CESAREAN SECTION    . CHOLECYSTECTOMY    . COLONOSCOPY WITH PROPOFOL N/A 12/29/2017   Procedure: COLONOSCOPY WITH PROPOFOL;  Surgeon: Lin Landsman, MD;  Location: [facility] ENDOSCOPY;  Service: Gastroenterology;  Laterality: N/A;  . DILATION AND CURETTAGE OF UTERUS    . JOINT REPLACEMENT Bilateral    knees  . JOINT REPLACEMENT Right    hip  . LAPAROSCOPIC APPENDECTOMY N/A 01/24/2018   Procedure: APPENDECTOMY LAPAROSCOPIC;  Surgeon: Olean Ree, MD;  Location: [facility] ORS;  Service: General;  Laterality: N/A;  . NASAL SEPTUM SURGERY    . REPLACEMENT TOTAL KNEE BILATERAL    . REVERSE SHOULDER ARTHROPLASTY Left 06/15/2018   Procedure: TOTAL .REVERSE SHOULDER ARTHROPLASTY;  Surgeon: Corky Mull, MD;  Location: [facility] ORS;  Service: Orthopedics;  Laterality: Left;  . TONSILLECTOMY    .  TOTAL HIP ARTHROPLASTY        Subjective Assessment - 07/06/18 1520    Subjective  Patient reports no pain in the shoulder, but in anterior arm and L elbow.    Currently in Pain?  Yes    Pain Score  2     Pain Location  Arm    Pain Orientation  Left    Pain Descriptors / Indicators  Spasm;Cramping    Pain Type  Surgical pain    Pain Onset  1 to 4 weeks ago    Pain Frequency  Constant         Therapeutic exercise: PROM for shoulder abduction/flexion, gentle ER x18mins to improve ROM, verbal/tactile cues to avoid muscle guarding throughout  Supine L elbow AROM flexion-extension, supination-pronation, and wrist flexion-extension and circumduction x30 ea, supported on pillow with verbal/visual cues  L hand extension with rubber band x30 supported on pillow, verbal/visual cues, demonstration needed   Patient response to treatment: No increase in pain, decreased muscle guarding with PROM intermittently with verbal/tactile cues.    PT Education - 07/06/18 1523    Education Details  hep updated to include supine with arm supported AROM activities, education about continuation of cryotherapy.    Person(s) Educated  Patient          PT Long Term Goals -  07/04/18 1424      PT LONG TERM GOAL #1   Title  Patient will be independent with HEP to continue benefits of therapy after discharge.    Baseline  Dependent with exercise performance    Time  6    Period  Weeks    Status  New    Target Date  08/29/18      PT LONG TERM GOAL #2   Title  Patient will be able to achieve 120 deg of shoulder flexion AROM without any increase in pain to better be able to reach a high shelf.    Baseline  Unable to perform AROM    Time  6    Period  Weeks    Status  New    Target Date  08/29/18      PT LONG TERM GOAL #3   Title  Patient will have a worst pain of 1/10 to better be able to perform House hold activities without high level of pain    Baseline  10/10    Time  6    Period  Weeks     Status  New    Target Date  08/29/18            Plan - 07/06/18 1618    Clinical Impression Statement  Patient exhibited muscle guarding during PROM, difficulty relaxing. Intermittent improvement with verbal/tactile cues though guarding throughout still noted. No complaints of pain during session.     Rehab Potential  Good    Clinical Impairments Affecting Rehab Potential  (+) highly motivated (-) chronicity of pain    PT Frequency  2x / week    PT Duration  8 weeks    PT Treatment/Interventions  Therapeutic exercise;Therapeutic activities;Neuromuscular re-education;Patient/family education;Manual techniques;Passive range of motion;Dry needling;ADLs/Self Care Home Management;Cryotherapy;Electrical Stimulation;Ultrasound;Moist Heat;Iontophoresis 4mg /ml Dexamethasone    PT Next Visit Plan  progress strength and endurance    PT Home Exercise Plan  see education section    Consulted and Agree with Plan of Care  Patient       Patient will benefit from skilled therapeutic intervention in order to improve the following deficits and impairments:  Pain, Increased muscle spasms, Decreased strength, Decreased endurance, Decreased range of motion, Decreased activity tolerance, Impaired sensation, Increased fascial restricitons, Decreased coordination, Decreased mobility, Postural dysfunction  Visit Diagnosis: Chronic left shoulder pain  Stiffness of left shoulder, not elsewhere classified  Muscle weakness (generalized)     Problem List Patient Active Problem List   Diagnosis Date Noted  . 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 Colon cancer screening   . Bilateral carpal tunnel syndrome 03/17/2017  . 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 Lumbar radiculitis 08/14/2014  . DDD (degenerative disc disease), lumbar 08/14/2014  . Sleep apnea 08/07/2014  . Obesity, unspecified 08/07/2014  . History of hypothyroidism 08/07/2014  . Chronic kidney  disease 08/07/2014  . Arthritis 08/07/2014    Lieutenant Diego PT, DPT 4:23 PM,07/06/18 [place] [facility] [address] [place], [address] Phone: [phone]   Fax:  [phone]  Name: Kelsey Woods MRN: [id number] Date of Birth: [birth year]

## 2018-07-11 ENCOUNTER — Ambulatory Visit: Payer: Medicare Other | Attending: Surgery

## 2018-07-11 DIAGNOSIS — G8929 Other chronic pain: Secondary | ICD-10-CM | POA: Diagnosis present

## 2018-07-11 DIAGNOSIS — M6281 Muscle weakness (generalized): Secondary | ICD-10-CM | POA: Diagnosis present

## 2018-07-11 DIAGNOSIS — M25612 Stiffness of left shoulder, not elsewhere classified: Secondary | ICD-10-CM | POA: Insufficient documentation

## 2018-07-11 DIAGNOSIS — M25512 Pain in left shoulder: Secondary | ICD-10-CM | POA: Insufficient documentation

## 2018-07-11 NOTE — Therapy (Signed)
[place] [facility] [address] [place], [address] Phone: [phone]   Fax:  [phone]  Physical Therapy Treatment  Patient Details  Name: Kelsey Woods MRN: [id number] Date of Birth: 12-May-1951 Referring Provider: Roland Rack MD   Encounter Date: 07/11/2018  PT End of Session - 07/11/18 1325    Visit Number  3    Number of Visits  17    Date for PT Re-Evaluation  08/28/18    PT Start Time  2409    PT Stop Time  1340    PT Time Calculation (min)  35 min    Activity Tolerance  Patient tolerated treatment well       Past Medical History:  Diagnosis Date  . Acute appendicitis with perforation and peritoneal abscess   . Anxiety   . Arthritis   . Complication of anesthesia    swelling of lips after gallblader surgery  . GERD (gastroesophageal reflux disease)   . Hypothyroidism    h/o no meds now  . Ruptured appendicitis 11/28/2017  . Sleep apnea     Past Surgical History:  Procedure Laterality Date  . APPENDECTOMY    . BREAST BIOPSY Right 1980's   Benign  . CESAREAN SECTION    . CHOLECYSTECTOMY    . COLONOSCOPY WITH PROPOFOL N/A 12/29/2017   Procedure: COLONOSCOPY WITH PROPOFOL;  Surgeon: Lin Landsman, MD;  Location: [facility] ENDOSCOPY;  Service: Gastroenterology;  Laterality: N/A;  . DILATION AND CURETTAGE OF UTERUS    . JOINT REPLACEMENT Bilateral    knees  . JOINT REPLACEMENT Right    hip  . LAPAROSCOPIC APPENDECTOMY N/A 01/24/2018   Procedure: APPENDECTOMY LAPAROSCOPIC;  Surgeon: Olean Ree, MD;  Location: [facility] ORS;  Service: General;  Laterality: N/A;  . NASAL SEPTUM SURGERY    . REPLACEMENT TOTAL KNEE BILATERAL    . REVERSE SHOULDER ARTHROPLASTY Left 06/15/2018   Procedure: TOTAL .REVERSE SHOULDER ARTHROPLASTY;  Surgeon: Corky Mull, MD;  Location: [facility] ORS;  Service: Orthopedics;  Laterality: Left;  . TONSILLECTOMY    . TOTAL HIP ARTHROPLASTY       Subjective Assessment - 07/11/18  1324    Subjective  Reports minimal pain in anterior arm. States she was sore after last PT session.    Patient Stated Goals  Improve shoulder function and decrease pain     Currently in Pain?  Yes    Pain Orientation  Left    Pain Descriptors / Indicators  Aching    Pain Type  Surgical pain    Pain Onset  1 to 4 weeks ago      Therapeutic exercise: Patient performed with verbal/visual, tactile cues from PT goal; improve mobility/ROM of LUE, as well as strengthening  PROM for shoulder abduction/flexion, gentle ER x80mins to improve ROM, verbal/tactile cues to avoid muscle guarding throughout  Supine L elbow AROM with minimal resistance flexion-extension, 1# weight for supination-pronation, and wrist flexion-extension and circumduction x30 ea, supported on pillow with verbal/visual cues  L hand extension with rubber band x30 supported on pillow, verbal/visual cues, demonstration needed  Deltoid isometrics in scapular plane x10x3"sec holds (abd/add/flex/extend) Seated scapular retractions x30,  seated shoulder hikes x20  Patient response to treatment:?No increase in pain, decreased muscle guarding with PROM intermittently with verbal/tactile cues, though decreased muscle guarding compared to last session.      PT Education - 07/11/18 1325    Education Details  HEP updated    Person(s) Educated  Patient    Methods  Explanation;Demonstration    Comprehension  Verbalized understanding          PT Long Term Goals - 07/04/18 1424      PT LONG TERM GOAL #1   Title  Patient will be independent with HEP to continue benefits of therapy after discharge.    Baseline  Dependent with exercise performance    Time  6    Period  Weeks    Status  New    Target Date  08/29/18      PT LONG TERM GOAL #2   Title  Patient will be able to achieve 120 deg of shoulder flexion AROM without any increase in pain to better be able to reach a high shelf.    Baseline  Unable to perform AROM    Time   6    Period  Weeks    Status  New    Target Date  08/29/18      PT LONG TERM GOAL #3   Title  Patient will have a worst pain of 1/10 to better be able to perform House hold activities without high level of pain    Baseline  10/10    Time  6    Period  Weeks    Status  New    Target Date  08/29/18            Plan - 07/11/18 1344    Clinical Impression Statement  Patient with improved PROM this session, as well as improved ability to relax during PROM to promote mobility. No complaints of pain with isometrics.     PT Frequency  2x / week    PT Duration  8 weeks    PT Treatment/Interventions  Therapeutic exercise;Therapeutic activities;Neuromuscular re-education;Patient/family education;Manual techniques;Passive range of motion;Dry needling;ADLs/Self Care Home Management;Cryotherapy;Electrical Stimulation;Ultrasound;Moist Heat;Iontophoresis 4mg /ml Dexamethasone    PT Next Visit Plan  progress isometrics, add to HEP    Consulted and Agree with Plan of Care  Patient       Patient will benefit from skilled therapeutic intervention in order to improve the following deficits and impairments:     Visit Diagnosis: Chronic left shoulder pain  Stiffness of left shoulder, not elsewhere classified  Muscle weakness (generalized)     Problem List Patient Active Problem List   Diagnosis Date Noted  . 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 Colon cancer screening   . Bilateral carpal tunnel syndrome 03/17/2017  . 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 Lumbar radiculitis 08/14/2014  . DDD (degenerative disc disease), lumbar 08/14/2014  . Sleep apnea 08/07/2014  . Obesity, unspecified 08/07/2014  . History of hypothyroidism 08/07/2014  . Chronic kidney disease 08/07/2014  . Arthritis 08/07/2014    Lieutenant Diego PT, DPT 1:46 PM,07/11/18 [phone]  [facility] [address] [place], [address] Phone: [phone]   Fax:  [phone]  Name: Kelsey Woods MRN: [id number] Date of Birth: 1951-07-17

## 2018-07-13 ENCOUNTER — Ambulatory Visit: Payer: Medicare Other

## 2018-07-13 DIAGNOSIS — M25612 Stiffness of left shoulder, not elsewhere classified: Secondary | ICD-10-CM

## 2018-07-13 DIAGNOSIS — G8929 Other chronic pain: Secondary | ICD-10-CM

## 2018-07-13 DIAGNOSIS — M25512 Pain in left shoulder: Principal | ICD-10-CM

## 2018-07-13 DIAGNOSIS — M6281 Muscle weakness (generalized): Secondary | ICD-10-CM

## 2018-07-13 NOTE — Therapy (Signed)
[facility] [address] [place], [address] Phone: [phone]   Fax:  [phone]  Physical Therapy Treatment  Patient Details  Name: Kelsey Woods MRN: [id number] Date of Birth: 06/28/1951 Referring Provider: Roland Rack MD   Encounter Date: 07/13/2018  PT End of Session - 07/13/18 1408    Visit Number  4    Number of Visits  17    Date for PT Re-Evaluation  08/28/18    PT Start Time  1325    PT Stop Time  1410    PT Time Calculation (min)  45 min    Activity Tolerance  Patient tolerated treatment well    Behavior During Therapy  [facility] for tasks assessed/performed       Past Medical History:  Diagnosis Date  . Acute appendicitis with perforation and peritoneal abscess   . Anxiety   . Arthritis   . Complication of anesthesia    swelling of lips after gallblader surgery  . GERD (gastroesophageal reflux disease)   . Hypothyroidism    h/o no meds now  . Ruptured appendicitis 11/28/2017  . Sleep apnea     Past Surgical History:  Procedure Laterality Date  . APPENDECTOMY    . BREAST BIOPSY Right 1980's   Benign  . CESAREAN SECTION    . CHOLECYSTECTOMY    . COLONOSCOPY WITH PROPOFOL N/A 12/29/2017   Procedure: COLONOSCOPY WITH PROPOFOL;  Surgeon: Lin Landsman, MD;  Location: [facility] ENDOSCOPY;  Service: Gastroenterology;  Laterality: N/A;  . DILATION AND CURETTAGE OF UTERUS    . JOINT REPLACEMENT Bilateral    knees  . JOINT REPLACEMENT Right    hip  . LAPAROSCOPIC APPENDECTOMY N/A 01/24/2018   Procedure: APPENDECTOMY LAPAROSCOPIC;  Surgeon: Olean Ree, MD;  Location: [facility] ORS;  Service: General;  Laterality: N/A;  . NASAL SEPTUM SURGERY    . REPLACEMENT TOTAL KNEE BILATERAL    . REVERSE SHOULDER ARTHROPLASTY Left 06/15/2018   Procedure: TOTAL .REVERSE SHOULDER ARTHROPLASTY;  Surgeon: Corky Mull, MD;  Location: [facility] ORS;  Service: Orthopedics;  Laterality: Left;  . TONSILLECTOMY    .  TOTAL HIP ARTHROPLASTY      There were no vitals filed for this visit.  Subjective Assessment - 07/13/18 1346    Subjective  Patient had some soreness after PT but tolerable. Able to do HEP without complaints.     Currently in Pain?  Yes    Pain Score  1     Pain Location  Shoulder    Pain Orientation  Left    Pain Descriptors / Indicators  Aching    Pain Type  Surgical pain    Pain Onset  1 to 4 weeks ago       Therapeutic exercise: Patient performed with verbal/visual, tactile cues from PT goal; improve mobility/ROM of LUE, as well as strengthening  PROM for shoulder abduction/flexion, gentle ER x10 mins to improve ROM, verbal/tactile cues to avoid muscle guarding throughout  Supine L elbow AROM with 1# dumbbell flexion-extension, 1# weight for supination-pronation, and wrist flexion-extension and circumduction x30 ea, supported on pillow with verbal/visual cues  L hand extension with rubber band x30 supported on pillow, verbal/visual cues, demonstration needed  Deltoid isometrics in scapular plane x12x3"sec holds (abd/add/flex/extend), taught to perform at home with pillow rolled under elbow Seated scapular retractions x30,  seated shoulder hikes x20  Manual therapy:  5 mins, STM/trigger pointing of L bicep/brachialis to address muscle tension complaints  of cramping.  Patient response to treatment:?No increase in pain, decreased muscle guarding with PROM intermittently with verbal/tactile cues.tImprovement     PT Education - 07/13/18 1347    Education Details  therex technique          PT Long Term Goals - 07/04/18 1424      PT LONG TERM GOAL #1   Title  Patient will be independent with HEP to continue benefits of therapy after discharge.    Baseline  Dependent with exercise performance    Time  6    Period  Weeks    Status  New    Target Date  08/29/18      PT LONG TERM GOAL #2   Title  Patient will be able to achieve 120 deg of shoulder flexion AROM  without any increase in pain to better be able to reach a high shelf.    Baseline  Unable to perform AROM    Time  6    Period  Weeks    Status  New    Target Date  08/29/18      PT LONG TERM GOAL #3   Title  Patient will have a worst pain of 1/10 to better be able to perform House hold activities without high level of pain    Baseline  10/10    Time  6    Period  Weeks    Status  New    Target Date  08/29/18            Plan - 07/13/18 1349    Clinical Impression Statement  Patient with decreased muscle guarding this session. STM/trigger point release to L bicep with improvement in pain after as well as 25% improvement in soft tissue integrity.    PT Frequency  2x / week    PT Duration  8 weeks    PT Treatment/Interventions  Therapeutic exercise;Therapeutic activities;Neuromuscular re-education;Patient/family education;Manual techniques;Passive range of motion;Dry needling;ADLs/Self Care Home Management;Cryotherapy;Electrical Stimulation;Ultrasound;Moist Heat;Iontophoresis 4mg /ml Dexamethasone       Patient will benefit from skilled therapeutic intervention in order to improve the following deficits and impairments:  Pain, Increased muscle spasms, Decreased strength, Decreased endurance, Decreased range of motion, Decreased activity tolerance, Impaired sensation, Increased fascial restricitons, Decreased coordination, Decreased mobility, Postural dysfunction  Visit Diagnosis: Chronic left shoulder pain  Stiffness of left shoulder, not elsewhere classified  Muscle weakness (generalized)     Problem List Patient Active Problem List   Diagnosis Date Noted  . 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 Colon cancer screening   . Bilateral carpal tunnel syndrome 03/17/2017  . 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 Lumbar radiculitis 08/14/2014  . DDD (degenerative disc disease), lumbar 08/14/2014  . Sleep apnea 08/07/2014  . Obesity,  unspecified 08/07/2014  . History of hypothyroidism 08/07/2014  . Chronic kidney disease 08/07/2014  . Arthritis 08/07/2014    Lieutenant Diego PT, DPT 2:12 PM,07/13/18 [place] [address] [place], [address] Phone: [phone]   Fax:  [phone]  Name: Kelsey Woods MRN: [id number] Date of Birth: 1951-07-11

## 2018-07-18 ENCOUNTER — Ambulatory Visit: Payer: Medicare Other

## 2018-07-18 DIAGNOSIS — G8929 Other chronic pain: Secondary | ICD-10-CM

## 2018-07-18 DIAGNOSIS — M6281 Muscle weakness (generalized): Secondary | ICD-10-CM

## 2018-07-18 DIAGNOSIS — M25512 Pain in left shoulder: Secondary | ICD-10-CM | POA: Diagnosis not present

## 2018-07-18 DIAGNOSIS — M25612 Stiffness of left shoulder, not elsewhere classified: Secondary | ICD-10-CM

## 2018-07-18 NOTE — Therapy (Signed)
[facility] [address] [place], [address] Phone: [phone]   Fax:  [phone]  Physical Therapy Treatment  Patient Details  Name: Kelsey Woods MRN: [id number] Date of Birth: 09-Dec-1950 Referring Provider: Roland Rack MD   Encounter Date: 07/18/2018  PT End of Session - 07/18/18 1415    Visit Number  5    Number of Visits  17    Date for PT Re-Evaluation  08/28/18    PT Start Time  0626    PT Stop Time  1453    PT Time Calculation (min)  38 min    Activity Tolerance  Patient tolerated treatment well    Behavior During Therapy  [facility] for tasks assessed/performed       Past Medical History:  Diagnosis Date  . Acute appendicitis with perforation and peritoneal abscess   . Anxiety   . Arthritis   . Complication of anesthesia    swelling of lips after gallblader surgery  . GERD (gastroesophageal reflux disease)   . Hypothyroidism    h/o no meds now  . Ruptured appendicitis 11/28/2017  . Sleep apnea     Past Surgical History:  Procedure Laterality Date  . APPENDECTOMY    . BREAST BIOPSY Right 1980's   Benign  . CESAREAN SECTION    . CHOLECYSTECTOMY    . COLONOSCOPY WITH PROPOFOL N/A 12/29/2017   Procedure: COLONOSCOPY WITH PROPOFOL;  Surgeon: Lin Landsman, MD;  Location: [facility] ENDOSCOPY;  Service: Gastroenterology;  Laterality: N/A;  . DILATION AND CURETTAGE OF UTERUS    . JOINT REPLACEMENT Bilateral    knees  . JOINT REPLACEMENT Right    hip  . LAPAROSCOPIC APPENDECTOMY N/A 01/24/2018   Procedure: APPENDECTOMY LAPAROSCOPIC;  Surgeon: Olean Ree, MD;  Location: [facility] ORS;  Service: General;  Laterality: N/A;  . NASAL SEPTUM SURGERY    . REPLACEMENT TOTAL KNEE BILATERAL    . REVERSE SHOULDER ARTHROPLASTY Left 06/15/2018   Procedure: TOTAL .REVERSE SHOULDER ARTHROPLASTY;  Surgeon: Corky Mull, MD;  Location: [facility] ORS;  Service: Orthopedics;  Laterality: Left;  . TONSILLECTOMY    .  TOTAL HIP ARTHROPLASTY       Subjective Assessment - 07/18/18 1416    Subjective  Patient with new sling from physician, states it has been much lighter. Reports that she is doing most of her exercises at home, not sure about how to perform deltoid isometrics.    Currently in Pain?  No/denies      Therapeutic exercise:Patient performed with verbal/visual, tactile cues from PT goal; improve mobility/ROM of LUE, as well as strengthening  PROM for shoulder abduction/flexion, gentle ER x15 mins to improve ROM, verbal/tactile cues to avoid muscle guarding throughout  Supine L elbow AROM with 1# dumbbell flexion-extension,1# weight forsupination-pronation, and wrist flexion-extension and circumduction x30 ea, supported on pillow with verbal/visual cues  L hand extension with rubber band x30 supported on pillow, verbal/visual cues, demonstration needed  Deltoid isometrics in scapular plane x15x3"sec holds (abd/add/flex/extend), retaught form/technique to perform at home with pillow rolled under elbow Seated scapular retractions x30,  seated shoulder hikes x30  Patient response to treatment:?No increase in pain, decreased muscle guarding with PROM intermittently with verbal/tactile cues.tImprovement     PT Education - 07/18/18 1419    Education Details  therex technique    Person(s) Educated  Patient    Methods  Explanation;Demonstration;Verbal cues;Handout    Comprehension  Verbalized understanding;Returned demonstration  PT Long Term Goals - 07/04/18 1424      PT LONG TERM GOAL #1   Title  Patient will be independent with HEP to continue benefits of therapy after discharge.    Baseline  Dependent with exercise performance    Time  6    Period  Weeks    Status  New    Target Date  08/29/18      PT LONG TERM GOAL #2   Title  Patient will be able to achieve 120 deg of shoulder flexion AROM without any increase in pain to better be able to reach a high shelf.     Baseline  Unable to perform AROM    Time  6    Period  Weeks    Status  New    Target Date  08/29/18      PT LONG TERM GOAL #3   Title  Patient will have a worst pain of 1/10 to better be able to perform House hold activities without high level of pain    Baseline  10/10    Time  6    Period  Weeks    Status  New    Target Date  08/29/18            Plan - 07/18/18 1454    Clinical Impression Statement  Patient with good tolerance to PROM this session, educated about how to perform isometric exercises. No complaints of pain at end of session, ice pack applied (unbilled) to prevent soreness/pain/swelling later.    PT Frequency  2x / week    PT Duration  8 weeks    PT Treatment/Interventions  Therapeutic exercise;Therapeutic activities;Neuromuscular re-education;Patient/family education;Manual techniques;Passive range of motion;Dry needling;ADLs/Self Care Home Management;Cryotherapy;Electrical Stimulation;Ultrasound;Moist Heat;Iontophoresis 4mg /ml Dexamethasone    PT Home Exercise Plan  [id number]       Patient will benefit from skilled therapeutic intervention in order to improve the following deficits and impairments:  Pain, Increased muscle spasms, Decreased strength, Decreased endurance, Decreased range of motion, Decreased activity tolerance, Impaired sensation, Increased fascial restricitons, Decreased coordination, Decreased mobility, Postural dysfunction  Visit Diagnosis: Chronic left shoulder pain  Stiffness of left shoulder, not elsewhere classified  Muscle weakness (generalized)     Problem List Patient Active Problem List   Diagnosis Date Noted  . 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 Colon cancer screening   . Bilateral carpal tunnel syndrome 03/17/2017  . 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 Lumbar radiculitis 08/14/2014  . DDD (degenerative disc disease), lumbar 08/14/2014  . Sleep apnea 08/07/2014  . Obesity,  unspecified 08/07/2014  . History of hypothyroidism 08/07/2014  . Chronic kidney disease 08/07/2014  . Arthritis 08/07/2014    Lieutenant Diego PT, DPT 3:02 PM,07/18/18 [place] [facility] [address] [place], [address] Phone: [phone]   Fax:  [phone]  Name: Kelsey Woods MRN: [id number] Date of Birth: [birth year]

## 2018-07-18 NOTE — Patient Instructions (Signed)
Access Code: [email or web address]  URL: [email or web address]  Date: 07/18/2018  Prepared by: Lieutenant Diego   Exercises  Isometric Shoulder Abduction at Wall - 15 reps - 1 sets - 2x daily - 7x weekly  Isometric Shoulder Adduction - 15 reps - 1 sets - 2x daily - 7x weekly  Standing Isometric Shoulder Extension with Doorway - Arm Bent - 15 reps - 1 sets - 2x daily - 7x weekly  Isometric Shoulder Flexion at Wall - 15 reps - 1 sets - 2x daily - 7x weekly  Seated Shoulder Shrugs - 10 reps - 3 sets - 4x daily - 7x weekly  Seated Scapular Retraction - 10 reps - 3 sets - 4x daily - 7x weekly

## 2018-07-20 ENCOUNTER — Ambulatory Visit: Payer: Medicare Other

## 2018-07-20 DIAGNOSIS — M25512 Pain in left shoulder: Secondary | ICD-10-CM | POA: Diagnosis not present

## 2018-07-20 DIAGNOSIS — M6281 Muscle weakness (generalized): Secondary | ICD-10-CM

## 2018-07-20 DIAGNOSIS — M25612 Stiffness of left shoulder, not elsewhere classified: Secondary | ICD-10-CM

## 2018-07-20 DIAGNOSIS — G8929 Other chronic pain: Secondary | ICD-10-CM

## 2018-07-20 NOTE — Therapy (Signed)
[facility] [address], [place], [address] Phone: [phone]   Fax:  [phone]  Physical Therapy Treatment  Patient Details  Name: Kelsey Woods MRN: [id number] Date of Birth: 07/10/1951 Referring Provider: Roland Rack MD   Encounter Date: 07/20/2018  PT End of Session - 07/20/18 1324    Visit Number  6    Number of Visits  17    Date for PT Re-Evaluation  08/28/18    PT Start Time  8341    PT Stop Time  1405    PT Time Calculation (min)  38 min    Activity Tolerance  Patient tolerated treatment well    Behavior During Therapy  [facility] for tasks assessed/performed       Past Medical History:  Diagnosis Date  . Acute appendicitis with perforation and peritoneal abscess   . Anxiety   . Arthritis   . Complication of anesthesia    swelling of lips after gallblader surgery  . GERD (gastroesophageal reflux disease)   . Hypothyroidism    h/o no meds now  . Ruptured appendicitis 11/28/2017  . Sleep apnea     Past Surgical History:  Procedure Laterality Date  . APPENDECTOMY    . BREAST BIOPSY Right 1980's   Benign  . CESAREAN SECTION    . CHOLECYSTECTOMY    . COLONOSCOPY WITH PROPOFOL N/A 12/29/2017   Procedure: COLONOSCOPY WITH PROPOFOL;  Surgeon: Lin Landsman, MD;  Location: [facility] ENDOSCOPY;  Service: Gastroenterology;  Laterality: N/A;  . DILATION AND CURETTAGE OF UTERUS    . JOINT REPLACEMENT Bilateral    knees  . JOINT REPLACEMENT Right    hip  . LAPAROSCOPIC APPENDECTOMY N/A 01/24/2018   Procedure: APPENDECTOMY LAPAROSCOPIC;  Surgeon: Olean Ree, MD;  Location: [facility] ORS;  Service: General;  Laterality: N/A;  . NASAL SEPTUM SURGERY    . REPLACEMENT TOTAL KNEE BILATERAL    . REVERSE SHOULDER ARTHROPLASTY Left 06/15/2018   Procedure: TOTAL .REVERSE SHOULDER ARTHROPLASTY;  Surgeon: Corky Mull, MD;  Location: [facility] ORS;  Service: Orthopedics;  Laterality: Left;  . TONSILLECTOMY    .  TOTAL HIP ARTHROPLASTY      There were no vitals filed for this visit.  Subjective Assessment - 07/20/18 1349    Subjective  Patient reports her back is sore today, but overall her shoulder is doing well.    Currently in Pain?  Yes    Pain Score  1     Pain Onset  1 to 4 weeks ago        Therapeutic exercise:Patient performed with verbal/visual, tactile cues from PT goal; improve mobility/ROM of LUE, as well as strengthening  PROM for shoulder abduction/flexion, gentle ER x66mins to improve ROM, verbal/tactile cues to avoid muscle guarding throughout  Supine L elbow AROM with1# dumbbellflexion-extension,1# weight forsupination-pronation, and wrist flexion-extension and circumduction x30 ea, supported on pillow with visual cues  L hand extension with rubber band x30 supported on pillow, verbal cues  Deltoid isometrics in scapular plane x20x3"sec holds (abd/add/flex/extend) with pillow rolled under elbow tactile cues Seated scapular retractions x30,  seated shoulder hikes x30  Patient response to treatment:?No increase in pain, decreased muscle guarding with PROM intermittently with verbal/tactile cues.    PT Education - 07/20/18 1350    Education Details  therex technique    Person(s) Educated  Patient    Methods  Explanation;Demonstration;Tactile cues;Verbal cues    Comprehension  Returned demonstration  PT Long Term Goals - 07/04/18 1424      PT LONG TERM GOAL #1   Title  Patient will be independent with HEP to continue benefits of therapy after discharge.    Baseline  Dependent with exercise performance    Time  6    Period  Weeks    Status  New    Target Date  08/29/18      PT LONG TERM GOAL #2   Title  Patient will be able to achieve 120 deg of shoulder flexion AROM without any increase in pain to better be able to reach a high shelf.    Baseline  Unable to perform AROM    Time  6    Period  Weeks    Status  New    Target Date  08/29/18       PT LONG TERM GOAL #3   Title  Patient will have a worst pain of 1/10 to better be able to perform House hold activities without high level of pain    Baseline  10/10    Time  6    Period  Weeks    Status  New    Target Date  08/29/18            Plan - 07/20/18 1404    Clinical Impression Statement  Patient demonstrated good ability to perform deltoid isometrics this session indicating HEP compliance. Patient continues to demonstrate improvements in PROM of LUE as well.      PT Frequency  2x / week    PT Duration  8 weeks    PT Treatment/Interventions  Therapeutic exercise;Therapeutic activities;Neuromuscular re-education;Patient/family education;Manual techniques;Passive range of motion;Dry needling;ADLs/Self Care Home Management;Cryotherapy;Electrical Stimulation;Ultrasound;Moist Heat;Iontophoresis 4mg /ml Dexamethasone    PT Next Visit Plan  progress isometrics, progress PROM     PT Home Exercise Plan  [email or web address]    Consulted and Agree with Plan of Care  Patient       Patient will benefit from skilled therapeutic intervention in order to improve the following deficits and impairments:  Pain, Increased muscle spasms, Decreased strength, Decreased endurance, Decreased range of motion, Decreased activity tolerance, Impaired sensation, Increased fascial restricitons, Decreased coordination, Decreased mobility, Postural dysfunction  Visit Diagnosis: Chronic left shoulder pain  Stiffness of left shoulder, not elsewhere classified  Muscle weakness (generalized)     Problem List Patient Active Problem List   Diagnosis Date Noted  . 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 Colon cancer screening   . Bilateral carpal tunnel syndrome 03/17/2017  . 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 Lumbar radiculitis 08/14/2014  . DDD (degenerative disc disease), lumbar 08/14/2014  . Sleep apnea 08/07/2014  . Obesity, unspecified 08/07/2014  . History of  hypothyroidism 08/07/2014  . Chronic kidney disease 08/07/2014  . Arthritis 08/07/2014   Lieutenant Diego PT, DPT 2:10 PM,07/20/18 [phone]  [facility] [address] [place], [address] Phone: [phone]   Fax:  [phone]  Name: Kelsey Woods MRN: [id number] Date of Birth: July 28, 1951

## 2018-07-25 ENCOUNTER — Ambulatory Visit: Payer: Medicare Other

## 2018-07-25 DIAGNOSIS — M25512 Pain in left shoulder: Secondary | ICD-10-CM | POA: Diagnosis not present

## 2018-07-25 DIAGNOSIS — G8929 Other chronic pain: Secondary | ICD-10-CM

## 2018-07-25 DIAGNOSIS — M6281 Muscle weakness (generalized): Secondary | ICD-10-CM

## 2018-07-25 DIAGNOSIS — M25612 Stiffness of left shoulder, not elsewhere classified: Secondary | ICD-10-CM

## 2018-07-25 NOTE — Therapy (Signed)
[facility] [address] [place], [address] Phone: [phone]   Fax:  [phone]  Physical Therapy Treatment  Patient Details  Name: Kelsey Woods MRN: [id number] Date of Birth: 1951-03-20 Referring Provider: Roland Rack MD   Encounter Date: 07/25/2018  PT End of Session - 07/25/18 1251    Visit Number  7    Number of Visits  17    Date for PT Re-Evaluation  08/28/18    PT Start Time  6222    PT Stop Time  1333    PT Time Calculation (min)  38 min    Activity Tolerance  Patient tolerated treatment well    Behavior During Therapy  [facility] for tasks assessed/performed       Past Medical History:  Diagnosis Date  . Acute appendicitis with perforation and peritoneal abscess   . Anxiety   . Arthritis   . Complication of anesthesia    swelling of lips after gallblader surgery  . GERD (gastroesophageal reflux disease)   . Hypothyroidism    h/o no meds now  . Ruptured appendicitis 11/28/2017  . Sleep apnea     Past Surgical History:  Procedure Laterality Date  . APPENDECTOMY    . BREAST BIOPSY Right 1980's   Benign  . CESAREAN SECTION    . CHOLECYSTECTOMY    . COLONOSCOPY WITH PROPOFOL N/A 12/29/2017   Procedure: COLONOSCOPY WITH PROPOFOL;  Surgeon: Lin Landsman, MD;  Location: [facility] ENDOSCOPY;  Service: Gastroenterology;  Laterality: N/A;  . DILATION AND CURETTAGE OF UTERUS    . JOINT REPLACEMENT Bilateral    knees  . JOINT REPLACEMENT Right    hip  . LAPAROSCOPIC APPENDECTOMY N/A 01/24/2018   Procedure: APPENDECTOMY LAPAROSCOPIC;  Surgeon: Olean Ree, MD;  Location: [facility] ORS;  Service: General;  Laterality: N/A;  . NASAL SEPTUM SURGERY    . REPLACEMENT TOTAL KNEE BILATERAL    . REVERSE SHOULDER ARTHROPLASTY Left 06/15/2018   Procedure: TOTAL .REVERSE SHOULDER ARTHROPLASTY;  Surgeon: Corky Mull, MD;  Location: [facility] ORS;  Service: Orthopedics;  Laterality: Left;  . TONSILLECTOMY    .  TOTAL HIP ARTHROPLASTY      There were no vitals filed for this visit.  Subjective Assessment - 07/25/18 1311    Subjective  Patient states her back is feeling better today. States she has minimal soreness in shoulder at start of session    Currently in Pain?  Yes    Pain Score  1     Pain Location  Shoulder    Pain Onset  1 to 4 weeks ago         Therapeutic exercise:Patient performed with verbal/visual, tactile cues from PT goal; improve mobility/ROM of LUE, as well as strengthening  PROM for shoulder abduction/flexion, gentle ER x71mins to improve ROM, verbal/tactile cues to avoid muscle guarding intermittently  Supine L elbow AROM with2# dumbbellflexion-extension,2# weight forsupination-pronation, and wrist flexion-extension and circumduction x20 ea, supported on pillow with visual cues  L hand extension with rubber band x30 supported on pillow, verbal cues  Deltoid isometrics in scapular plane x15x3"sec holds (abd/add/flex/extend) with pillow rolled under elbow tactile cues ER/IR isometrics on L with pillow rolled under elbow 15x3" Seated scapular retractions x30  seated shoulder hikes x30  Shoulder circles CW/CCW x20  Patient response to treatment:?No increase in pain, decreased muscle guarding with PROM intermittently with verbal/tactile cues.     PT Education - 07/25/18 1312    Education  Details  therex technique    Person(s) Educated  Patient    Methods  Explanation;Demonstration    Comprehension  Verbalized understanding          PT Long Term Goals - 07/04/18 1424      PT LONG TERM GOAL #1   Title  Patient will be independent with HEP to continue benefits of therapy after discharge.    Baseline  Dependent with exercise performance    Time  6    Period  Weeks    Status  New    Target Date  08/29/18      PT LONG TERM GOAL #2   Title  Patient will be able to achieve 120 deg of shoulder flexion AROM without any increase in pain to better be  able to reach a high shelf.    Baseline  Unable to perform AROM    Time  6    Period  Weeks    Status  New    Target Date  08/29/18      PT LONG TERM GOAL #3   Title  Patient will have a worst pain of 1/10 to better be able to perform House hold activities without high level of pain    Baseline  10/10    Time  6    Period  Weeks    Status  New    Target Date  08/29/18            Plan - 07/25/18 1327    Clinical Impression Statement  Patient had no complaints at start of session. ER/IR isometrics added to HEP. Continues to exhibit good PROM of L shoulder. Some muscle guarding noted, improvement with tactile/verbal cues. Patient to see physician 07/31/18 for 6 week follow up, tentatively begin AAROM at that time.     PT Treatment/Interventions  Therapeutic exercise;Therapeutic activities;Neuromuscular re-education;Patient/family education;Manual techniques;Passive range of motion;Dry needling;ADLs/Self Care Home Management;Cryotherapy;Electrical Stimulation;Ultrasound;Moist Heat;Iontophoresis 4mg /ml Dexamethasone    Consulted and Agree with Plan of Care  Patient       Patient will benefit from skilled therapeutic intervention in order to improve the following deficits and impairments:  Pain, Increased muscle spasms, Decreased strength, Decreased endurance, Decreased range of motion, Decreased activity tolerance, Impaired sensation, Increased fascial restricitons, Decreased coordination, Decreased mobility, Postural dysfunction  Visit Diagnosis: Chronic left shoulder pain  Stiffness of left shoulder, not elsewhere classified  Muscle weakness (generalized)     Problem List Patient Active Problem List   Diagnosis Date Noted  . 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 Colon cancer screening   . Bilateral carpal tunnel syndrome 03/17/2017  . 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 Lumbar radiculitis 08/14/2014  . DDD (degenerative disc  disease), lumbar 08/14/2014  . Sleep apnea 08/07/2014  . Obesity, unspecified 08/07/2014  . History of hypothyroidism 08/07/2014  . Chronic kidney disease 08/07/2014  . Arthritis 08/07/2014    Lieutenant Diego PT, DPT 1:33 PM,07/25/18 [phone]  [facility] [address], [place], [address] Phone: [phone]   Fax:  [phone]  Name: Kelsey Woods MRN: [id number] Date of Birth: 09/22/1951

## 2018-07-27 ENCOUNTER — Ambulatory Visit: Payer: Medicare Other

## 2018-07-27 DIAGNOSIS — M25512 Pain in left shoulder: Principal | ICD-10-CM

## 2018-07-27 DIAGNOSIS — M25612 Stiffness of left shoulder, not elsewhere classified: Secondary | ICD-10-CM

## 2018-07-27 DIAGNOSIS — M6281 Muscle weakness (generalized): Secondary | ICD-10-CM

## 2018-07-27 DIAGNOSIS — G8929 Other chronic pain: Secondary | ICD-10-CM

## 2018-07-27 NOTE — Therapy (Signed)
[facility] [address] [place], [address] Phone: [phone]   Fax:  [phone]  Physical Therapy Treatment  Patient Details  Name: Kelsey Woods MRN: [id number] Date of Birth: 1950/11/27 Referring Provider: Roland Rack MD   Encounter Date: 07/27/2018  PT End of Session - 07/27/18 1333    Visit Number  8    Number of Visits  17    Date for PT Re-Evaluation  08/28/18    PT Start Time  1310    PT Stop Time  1350    PT Time Calculation (min)  40 min    Activity Tolerance  Patient tolerated treatment well    Behavior During Therapy  [facility] for tasks assessed/performed       Past Medical History:  Diagnosis Date  . Acute appendicitis with perforation and peritoneal abscess   . Anxiety   . Arthritis   . Complication of anesthesia    swelling of lips after gallblader surgery  . GERD (gastroesophageal reflux disease)   . Hypothyroidism    h/o no meds now  . Ruptured appendicitis 11/28/2017  . Sleep apnea     Past Surgical History:  Procedure Laterality Date  . APPENDECTOMY    . BREAST BIOPSY Right 1980's   Benign  . CESAREAN SECTION    . CHOLECYSTECTOMY    . COLONOSCOPY WITH PROPOFOL N/A 12/29/2017   Procedure: COLONOSCOPY WITH PROPOFOL;  Surgeon: Lin Landsman, MD;  Location: [facility] ENDOSCOPY;  Service: Gastroenterology;  Laterality: N/A;  . DILATION AND CURETTAGE OF UTERUS    . JOINT REPLACEMENT Bilateral    knees  . JOINT REPLACEMENT Right    hip  . LAPAROSCOPIC APPENDECTOMY N/A 01/24/2018   Procedure: APPENDECTOMY LAPAROSCOPIC;  Surgeon: Olean Ree, MD;  Location: [facility] ORS;  Service: General;  Laterality: N/A;  . NASAL SEPTUM SURGERY    . REPLACEMENT TOTAL KNEE BILATERAL    . REVERSE SHOULDER ARTHROPLASTY Left 06/15/2018   Procedure: TOTAL .REVERSE SHOULDER ARTHROPLASTY;  Surgeon: Corky Mull, MD;  Location: [facility] ORS;  Service: Orthopedics;  Laterality: Left;  . TONSILLECTOMY    .  TOTAL HIP ARTHROPLASTY      There were no vitals filed for this visit.  Subjective Assessment - 07/27/18 1332    Subjective  Pt reports that she is doing well on this date. She denies any L shoulder pain upon arrival. She reports regular soreness after therapy. HEP is going but overall pt feels like her rehab process is going slowly.     Pertinent History  B Knee replacement, appendix excision, R hip Replacement, Reverse total shoulder on the L,     Limitations  Lifting;House hold activities    Diagnostic tests  MRI, Xray    Patient Stated Goals  Improve shoulder function and decrease pain     Currently in Pain?  No/denies         TREATMENT  Ther-ex  Seated scapular retractions 3s hold x 10;  Shoulder circles CW/CCW x 20 each direction; Seated thoracic extension over towel roll in chair x 10 with added cervical extension for improved thoracic mobility; PROM for shoulder abduction/flexion/ER/IR x 33mins to improve ROM, verbal/tactile cues to avoid muscle guarding intermittently with "floppy fish" mobilizations; Supine L elbow AROM flexion with2# dumbbell (DB)x 20; Supine L elbow extension with manual resistance x 20; Supine L forearm supine/pronation with 2# DB x 20; Supine L wrist extension with 2# DB x 20; Supine L wrist  flexion with 2# DB x 20; Supine deltoid isometrics in scapular plane 5s hold x 15 each direction for flexion, abduction, adduction, extension, IR and ER, large towel roll under elbow to prevent any shoulder extension. Ended internal rotation at rep 9 due to reported increase in posterior shoulder pain; L shoulder PROM flexion is 120 degrees; L shoulder PROM abduction is 90 degrees;  Pt educated throughout session about proper posture and technique with exercises. Improved exercise technique, movement at target joints, use of target muscles after min to mod verbal, visual, tactile cues.   Pt making excellent progress with therapy. She is 6 weeks post-op today  and will be able to start progressing her rehab in the coming weeks as her protocol progresses. Pt demonstrates good muscle activation with deltoid isometrics. Her worst shoulder pain has decreased from 10/10 at initial evaluation to 5/10 over the last week. Pt will continue to benefit from skilled PT services to address deficits in L shoulder mobility, strength, pain, and function.                            PT Long Term Goals - 07/04/18 1424      PT LONG TERM GOAL #1   Title  Patient will be independent with HEP to continue benefits of therapy after discharge.    Baseline  Dependent with exercise performance    Time  6    Period  Weeks    Status  New    Target Date  08/29/18      PT LONG TERM GOAL #2   Title  Patient will be able to achieve 120 deg of shoulder flexion AROM without any increase in pain to better be able to reach a high shelf.    Baseline  Unable to perform AROM    Time  6    Period  Weeks    Status  New    Target Date  08/29/18      PT LONG TERM GOAL #3   Title  Patient will have a worst pain of 1/10 to better be able to perform House hold activities without high level of pain    Baseline  10/10    Time  6    Period  Weeks    Status  New    Target Date  08/29/18            Plan - 07/27/18 1333    Clinical Impression Statement  Pt making excellent progress with therapy. She is 6 weeks post-op today and will be able to start progressing her rehab in the coming weeks as her protocol progresses. Pt demonstrates good muscle activation with deltoid isometrics. Her worst shoulder pain has decreased from 10/10 at initial evaluation to 5/10 over the last week.  L shoulder PROM flexion is 120 degrees and abduction is 90 degrees. L shoulder ER is around 10 degrees and IR is close to 70 degrees. Pt will continue to benefit from skilled PT services to address deficits in L shoulder mobility, strength, pain, and function.     Rehab Potential  Good     Clinical Impairments Affecting Rehab Potential  (+) highly motivated (-) chronicity of pain    PT Frequency  2x / week    PT Duration  8 weeks    PT Treatment/Interventions  Therapeutic exercise;Therapeutic activities;Neuromuscular re-education;Patient/family education;Manual techniques;Passive range of motion;Dry needling;ADLs/Self Care Home Management;Cryotherapy;Electrical Stimulation;Ultrasound;Moist Heat;Iontophoresis 4mg /ml Dexamethasone    PT  Next Visit Plan  progress isometrics, progress PROM. progress protocol after week 6    PT Home Exercise Plan  [email or web address]    Consulted and Agree with Plan of Care  Patient       Patient will benefit from skilled therapeutic intervention in order to improve the following deficits and impairments:  Pain, Increased muscle spasms, Decreased strength, Decreased endurance, Decreased range of motion, Decreased activity tolerance, Impaired sensation, Increased fascial restricitons, Decreased coordination, Decreased mobility, Postural dysfunction  Visit Diagnosis: Chronic left shoulder pain  Stiffness of left shoulder, not elsewhere classified  Muscle weakness (generalized)     Problem List Patient Active Problem List   Diagnosis Date Noted  . 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 Colon cancer screening   . Bilateral carpal tunnel syndrome 03/17/2017  . 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 Lumbar radiculitis 08/14/2014  . DDD (degenerative disc disease), lumbar 08/14/2014  . Sleep apnea 08/07/2014  . Obesity, unspecified 08/07/2014  . History of hypothyroidism 08/07/2014  . Chronic kidney disease 08/07/2014  . Arthritis 08/07/2014   Lyndel Safe Huprich PT, DPT, GCS  Huprich,Jason 07/27/2018, 2:20 PM  [facility] [address], [place], [address] Phone: [phone]   Fax:  [phone]  Name: Kelsey Woods MRN: [id number] Date of  Birth: May 12, 1951

## 2018-08-01 ENCOUNTER — Ambulatory Visit: Payer: Medicare Other

## 2018-08-01 DIAGNOSIS — M25612 Stiffness of left shoulder, not elsewhere classified: Secondary | ICD-10-CM

## 2018-08-01 DIAGNOSIS — G8929 Other chronic pain: Secondary | ICD-10-CM

## 2018-08-01 DIAGNOSIS — M6281 Muscle weakness (generalized): Secondary | ICD-10-CM

## 2018-08-01 DIAGNOSIS — M25512 Pain in left shoulder: Secondary | ICD-10-CM | POA: Diagnosis not present

## 2018-08-01 NOTE — Therapy (Signed)
[place] [facility] [address], [place], [address] Phone: [phone]   Fax:  [phone]  Physical Therapy Treatment  Patient Details  Name: Kelsey Woods MRN: [id number] Date of Birth: 07-21-1951 Referring Provider: Roland Rack MD   Encounter Date: 08/01/2018  PT End of Session - 08/01/18 1345    Visit Number  9    Number of Visits  17    Date for PT Re-Evaluation  08/28/18    PT Start Time  1448    PT Stop Time  1343    PT Time Calculation (min)  38 min    Activity Tolerance  Patient tolerated treatment well    Behavior During Therapy  [facility] for tasks assessed/performed       Past Medical History:  Diagnosis Date  . Acute appendicitis with perforation and peritoneal abscess   . Anxiety   . Arthritis   . Complication of anesthesia    swelling of lips after gallblader surgery  . GERD (gastroesophageal reflux disease)   . Hypothyroidism    h/o no meds now  . Ruptured appendicitis 11/28/2017  . Sleep apnea     Past Surgical History:  Procedure Laterality Date  . APPENDECTOMY    . BREAST BIOPSY Right 1980's   Benign  . CESAREAN SECTION    . CHOLECYSTECTOMY    . COLONOSCOPY WITH PROPOFOL N/A 12/29/2017   Procedure: COLONOSCOPY WITH PROPOFOL;  Surgeon: Lin Landsman, MD;  Location: [facility]COPY;  Service: Gastroenterology;  Laterality: N/A;  . DILATION AND CURETTAGE OF UTERUS    . JOINT REPLACEMENT Bilateral    knees  . JOINT REPLACEMENT Right    hip  . LAPAROSCOPIC APPENDECTOMY N/A 01/24/2018   Procedure: APPENDECTOMY LAPAROSCOPIC;  Surgeon: Olean Ree, MD;  Location: [facility] ORS;  Service: General;  Laterality: N/A;  . NASAL SEPTUM SURGERY    . REPLACEMENT TOTAL KNEE BILATERAL    . REVERSE SHOULDER ARTHROPLASTY Left 06/15/2018   Procedure: TOTAL .REVERSE SHOULDER ARTHROPLASTY;  Surgeon: Corky Mull, MD;  Location: [facility] ORS;  Service: Orthopedics;  Laterality: Left;  . TONSILLECTOMY    .  TOTAL HIP ARTHROPLASTY      There were no vitals filed for this visit.  Subjective Assessment - 08/01/18 1343    Subjective  Patient has no pain at start of session. States she has started to discontinue wearing her sling at her per physician.    Currently in Pain?  No/denies      TREATMENT:  Seated scapular retractions 3s hold x 10;  Shoulder circles CW/CCW x 20 each direction; PROM for shoulder abduction/flexion/ER/IR x 16mins to improve ROM, verbal/tactile cues to avoid muscle guarding Supine L elbow  flexion (bicep, brachioradialis, brachialis) with YTB x 20; Supine L elbow extension with manual resistance x 20; Supine L forearm supine/pronation with YTB x 20; Supine L wrist extension with  YTB x 20; Supine L wrist flexion with YTB x 20; Supine deltoid isometrics in scapular plane 5s hold x 20 each direction for flexion, abduction, adduction, extension, IR and ER, large towel roll under elbow to prevent any shoulder extension.  Supine AAROM of shoulder elevation/flexion x 15 Supine ER/IR AROM in scapular plane x10  Manual therapy: x12mins to anterior arm due to complaints of pain/tenderness. Patient reported decreased sensitivity.  Patient response to treatment: No complaints of pain at end of session, no pain noted throughout session.     PT Education - 08/01/18 1344  Education Details  therex technique    Person(s) Educated  Patient    Methods  Explanation;Demonstration    Comprehension  Returned demonstration;Verbalized understanding          PT Long Term Goals - 07/04/18 1424      PT LONG TERM GOAL #1   Title  Patient will be independent with HEP to continue benefits of therapy after discharge.    Baseline  Dependent with exercise performance    Time  6    Period  Weeks    Status  New    Target Date  08/29/18      PT LONG TERM GOAL #2   Title  Patient will be able to achieve 120 deg of shoulder flexion AROM without any increase in pain to better be  able to reach a high shelf.    Baseline  Unable to perform AROM    Time  6    Period  Weeks    Status  New    Target Date  08/29/18      PT LONG TERM GOAL #3   Title  Patient will have a worst pain of 1/10 to better be able to perform House hold activities without high level of pain    Baseline  10/10    Time  6    Period  Weeks    Status  New    Target Date  08/29/18            Plan - 08/01/18 1344    Clinical Impression Statement  Patient had good tolerance to progression of plan today to include AROm/AAROM of L shoulder motion. Verbal cues to address some technique/form of exercises but overall performed well without complaint. update HEP next session planned.     PT Treatment/Interventions  Therapeutic exercise;Therapeutic activities;Neuromuscular re-education;Patient/family education;Manual techniques;Passive range of motion;Dry needling;ADLs/Self Care Home Management;Cryotherapy;Electrical Stimulation;Ultrasound;Moist Heat;Iontophoresis 4mg /ml Dexamethasone    PT Home Exercise Plan  [id number]    Consulted and Agree with Plan of Care  Patient       Patient will benefit from skilled therapeutic intervention in order to improve the following deficits and impairments:  Pain, Increased muscle spasms, Decreased strength, Decreased endurance, Decreased range of motion, Decreased activity tolerance, Impaired sensation, Increased fascial restricitons, Decreased coordination, Decreased mobility, Postural dysfunction  Visit Diagnosis: Chronic left shoulder pain  Stiffness of left shoulder, not elsewhere classified  Muscle weakness (generalized)     Problem List Patient Active Problem List   Diagnosis Date Noted  . 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 Colon cancer screening   . Bilateral carpal tunnel syndrome 03/17/2017  . 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 Lumbar radiculitis 08/14/2014  . DDD (degenerative disc disease), lumbar  08/14/2014  . Sleep apnea 08/07/2014  . Obesity, unspecified 08/07/2014  . History of hypothyroidism 08/07/2014  . Chronic kidney disease 08/07/2014  . Arthritis 08/07/2014    Lieutenant Diego PT, DPT 1:46 PM,08/01/18 [phone]  [facility] [address] [place], [address] Phone: [phone]   Fax:  [phone]  Name: Kelsey Woods MRN: [id number] Date of Birth: 21-Dec-1950

## 2018-08-03 ENCOUNTER — Ambulatory Visit: Payer: Medicare Other

## 2018-08-03 DIAGNOSIS — M6281 Muscle weakness (generalized): Secondary | ICD-10-CM

## 2018-08-03 DIAGNOSIS — M25512 Pain in left shoulder: Secondary | ICD-10-CM | POA: Diagnosis not present

## 2018-08-03 DIAGNOSIS — G8929 Other chronic pain: Secondary | ICD-10-CM

## 2018-08-03 DIAGNOSIS — M25612 Stiffness of left shoulder, not elsewhere classified: Secondary | ICD-10-CM

## 2018-08-03 NOTE — Therapy (Signed)
[place] [facility] [address] [place], [address] Phone: [phone]   Fax:  [phone]  Physical Therapy Treatment  Patient Details  Name: Kelsey Woods MRN: [id number] Date of Birth: 11/14/1950 Referring Provider (PT): Poggi MD   Encounter Date: 08/03/2018  PT End of Session - 08/03/18 1255    Visit Number  10    Number of Visits  17    Date for PT Re-Evaluation  08/28/18    PT Start Time  1300    PT Stop Time  1345    PT Time Calculation (min)  45 min    Activity Tolerance  Patient tolerated treatment well    Behavior During Therapy  [facility] for tasks assessed/performed       Past Medical History:  Diagnosis Date  . Acute appendicitis with perforation and peritoneal abscess   . Anxiety   . Arthritis   . Complication of anesthesia    swelling of lips after gallblader surgery  . GERD (gastroesophageal reflux disease)   . Hypothyroidism    h/o no meds now  . Ruptured appendicitis 11/28/2017  . Sleep apnea     Past Surgical History:  Procedure Laterality Date  . APPENDECTOMY    . BREAST BIOPSY Right 1980's   Benign  . CESAREAN SECTION    . CHOLECYSTECTOMY    . COLONOSCOPY WITH PROPOFOL N/A 12/29/2017   Procedure: COLONOSCOPY WITH PROPOFOL;  Surgeon: Lin Landsman, MD;  Location: [facility] ENDOSCOPY;  Service: Gastroenterology;  Laterality: N/A;  . DILATION AND CURETTAGE OF UTERUS    . JOINT REPLACEMENT Bilateral    knees  . JOINT REPLACEMENT Right    hip  . LAPAROSCOPIC APPENDECTOMY N/A 01/24/2018   Procedure: APPENDECTOMY LAPAROSCOPIC;  Surgeon: Olean Ree, MD;  Location: [facility] ORS;  Service: General;  Laterality: N/A;  . NASAL SEPTUM SURGERY    . REPLACEMENT TOTAL KNEE BILATERAL    . REVERSE SHOULDER ARTHROPLASTY Left 06/15/2018   Procedure: TOTAL .REVERSE SHOULDER ARTHROPLASTY;  Surgeon: Corky Mull, MD;  Location: [facility] ORS;  Service: Orthopedics;  Laterality: Left;  . TONSILLECTOMY     . TOTAL HIP ARTHROPLASTY      There were no vitals filed for this visit.  Subjective Assessment - 08/03/18 1347    Subjective  Patient had no pain at start of session. States she tried some of her new exercises at home.    Currently in Pain?  No/denies       TREATMENT: pulleys in flexion and scapular plane x15 ea direction., alt elbow extension/flexion  Supine L elbow flexion  with YTB x 20; Supine L elbow extension with manual resistance x 20; Supine L forearm supine/pronation with YTB x 20; Supine L wrist extension with  YTB x 20; Supine L wrist flexion with YTB x 20; deltoid isometrics in scapular plane5s hold x 15 each direction for flexion, abduction, adduction, extension, shoulder IR and ER, large towel roll under elbow to prevent any shoulder extension.  Supine AAROM of shoulder elevation/flexion x 15 Supine AROM of L shoulder x15 S/l shoulder abduction L x15 Seated ER/IR AROM in scapular plane x15  Manual therapy: x26mins to lateral arm (deltoids, rotator cuff insertions) due to complaints of pain/tenderness. Patient reported decreased sensitivity.  Patient response to treatment: No complaints of pain at end of session, mild complaints of LUE fatigue.     PT Education - 08/03/18 1255    Education Details  therex form/technique  Person(s) Educated  Patient    Methods  Explanation;Demonstration    Comprehension  Verbalized understanding;Returned demonstration          PT Long Term Goals - 08/03/18 1307      PT LONG TERM GOAL #1   Title  Patient will be independent with HEP to continue benefits of therapy after discharge.    Baseline  Dependent with exercise performance    Time  6    Period  Weeks    Status  On-going    Target Date  08/29/18      PT LONG TERM GOAL #2   Title  Patient will be able to achieve 120 deg of shoulder flexion AROM without any increase in pain to better be able to reach a high shelf.    Baseline  Unable to perform AROM ;  80deg    Time  6    Period  Weeks    Status  On-going    Target Date  08/29/18      PT LONG TERM GOAL #3   Title  Patient will have a worst pain of 1/10 to better be able to perform House hold activities without high level of pain    Baseline  10/10 : 3/10, still progressing to return to PLOF    Time  6    Period  Weeks    Status  On-going    Target Date  08/29/18            Plan - 08/03/18 1311    Clinical Impression Statement  Patient demonstrated AROM to ~80degs today without mild complaints of lateral shoulder pain. Good tolerance to progression of program today, HEP updated.    PT Frequency  2x / week    PT Duration  8 weeks    PT Treatment/Interventions  Therapeutic exercise;Therapeutic activities;Neuromuscular re-education;Patient/family education;Manual techniques;Passive range of motion;Dry needling;ADLs/Self Care Home Management;Cryotherapy;Electrical Stimulation;Ultrasound;Moist Heat;Iontophoresis 4mg /ml Dexamethasone    PT Home Exercise Plan  [id number] updated see patient instructions       Patient will benefit from skilled therapeutic intervention in order to improve the following deficits and impairments:  Pain, Increased muscle spasms, Decreased strength, Decreased endurance, Decreased range of motion, Decreased activity tolerance, Impaired sensation, Increased fascial restricitons, Decreased coordination, Decreased mobility, Postural dysfunction  Visit Diagnosis: Chronic left shoulder pain  Stiffness of left shoulder, not elsewhere classified  Muscle weakness (generalized)     Problem List Patient Active Problem List   Diagnosis Date Noted  . 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 Colon cancer screening   . Bilateral carpal tunnel syndrome 03/17/2017  . 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 Lumbar radiculitis 08/14/2014  . DDD (degenerative disc disease), lumbar 08/14/2014  . Sleep apnea 08/07/2014  . Obesity,  unspecified 08/07/2014  . History of hypothyroidism 08/07/2014  . Chronic kidney disease 08/07/2014  . Arthritis 08/07/2014    Lieutenant Diego PT, DPT 1:51 PM,08/03/18 [phone]  [place] [facility] [address], [place], [address] Phone: [phone]   Fax:  [phone]  Name: Kelsey Woods MRN: [id number] Date of Birth: 1950/12/19

## 2018-08-03 NOTE — Patient Instructions (Signed)
Access Code: [email or web address]  URL: [email or web address]  Date: 08/03/2018  Prepared by: Lieutenant Diego   Exercises  Supine Shoulder Flexion Extension Full Range AROM - 15 reps - 1 sets - 3x daily - 7x weekly  Supine Shoulder Flexion PROM - 15 reps - 1 sets - 3x daily - 7x weekly  Sidelying Shoulder Abduction - 15 reps - 1 sets - 3x daily - 7x weekly  Seated Shoulder Flexion Self PROM - 15 reps - 1 sets - 3x daily - 7x weekly  Seated Shoulder External Rotation - 10 reps - 1 sets - 1x daily - 7x weekly  Seated Shoulder Shrugs - 10 reps - 3 sets - 2x daily - 7x weekly  Seated Scapular Retraction - 10 reps - 3 sets - 2x daily - 7x weekly  Isometric Shoulder Abduction at Wall - 20 reps - 1 sets - 2x daily - 7x weekly  Isometric Shoulder Adduction - 20 reps - 1 sets - 2x daily - 7x weekly  Standing Isometric Shoulder Extension with Doorway - Arm Bent - 15 reps - 1 sets - 2x daily - 7x weekly  Isometric Shoulder Flexion at Wall - 20 reps - 1 sets - 2x daily - 7x weekly  Seated Elbow Flexion with Resistance - 10 reps - 3 sets - 1x daily - 7x weekly  Wrist Extension with Resistance - 10 reps - 3 sets - 1x daily - 7x weekly  Wrist Flexion with Resistance - 10 reps - 3 sets - 1x daily - 7x weekly  Forearm Supination with Resistance - 10 reps - 3 sets - 1x daily - 7x weekly  Forearm Pronation with Resistance - 10 reps - 3 sets - 1x daily - 7x weekly

## 2018-08-08 ENCOUNTER — Ambulatory Visit: Payer: Medicare Other

## 2018-08-10 ENCOUNTER — Ambulatory Visit: Payer: Medicare Other | Attending: Surgery

## 2018-08-10 DIAGNOSIS — G8929 Other chronic pain: Secondary | ICD-10-CM | POA: Diagnosis present

## 2018-08-10 DIAGNOSIS — M25612 Stiffness of left shoulder, not elsewhere classified: Secondary | ICD-10-CM | POA: Insufficient documentation

## 2018-08-10 DIAGNOSIS — M25512 Pain in left shoulder: Secondary | ICD-10-CM | POA: Insufficient documentation

## 2018-08-10 DIAGNOSIS — M6281 Muscle weakness (generalized): Secondary | ICD-10-CM | POA: Diagnosis present

## 2018-08-10 NOTE — Therapy (Signed)
[place] [facility] [address], [place], [address] Phone: [phone]   Fax:  [phone]  Physical Therapy Treatment  Patient Details  Name: Kelsey Woods MRN: [id number] Date of Birth: Aug 07, 1951 Referring Provider (PT): Poggi MD   Encounter Date: 08/10/2018  PT End of Session - 08/10/18 0929    Visit Number  11    Number of Visits  17    Date for PT Re-Evaluation  08/28/18    PT Start Time  0925    PT Stop Time  1010    PT Time Calculation (min)  45 min    Activity Tolerance  Patient tolerated treatment well    Behavior During Therapy  [facility] for tasks assessed/performed       Past Medical History:  Diagnosis Date  . Acute appendicitis with perforation and peritoneal abscess   . Anxiety   . Arthritis   . Complication of anesthesia    swelling of lips after gallblader surgery  . GERD (gastroesophageal reflux disease)   . Hypothyroidism    h/o no meds now  . Ruptured appendicitis 11/28/2017  . Sleep apnea     Past Surgical History:  Procedure Laterality Date  . APPENDECTOMY    . BREAST BIOPSY Right 1980's   Benign  . CESAREAN SECTION    . CHOLECYSTECTOMY    . COLONOSCOPY WITH PROPOFOL N/A 12/29/2017   Procedure: COLONOSCOPY WITH PROPOFOL;  Surgeon: Lin Landsman, MD;  Location: [facility] ENDOSCOPY;  Service: Gastroenterology;  Laterality: N/A;  . DILATION AND CURETTAGE OF UTERUS    . JOINT REPLACEMENT Bilateral    knees  . JOINT REPLACEMENT Right    hip  . LAPAROSCOPIC APPENDECTOMY N/A 01/24/2018   Procedure: APPENDECTOMY LAPAROSCOPIC;  Surgeon: Olean Ree, MD;  Location: [facility] ORS;  Service: General;  Laterality: N/A;  . NASAL SEPTUM SURGERY    . REPLACEMENT TOTAL KNEE BILATERAL    . REVERSE SHOULDER ARTHROPLASTY Left 06/15/2018   Procedure: TOTAL .REVERSE SHOULDER ARTHROPLASTY;  Surgeon: Corky Mull, MD;  Location: [facility] ORS;  Service: Orthopedics;  Laterality: Left;  . TONSILLECTOMY     . TOTAL HIP ARTHROPLASTY      There were no vitals filed for this visit.  Subjective Assessment - 08/10/18 0928    Subjective  Patient reports that she is sore but not very painful today. Mentions she wants clarification on her HEP.    Currently in Pain?  No/denies    Pain Onset  1 to 4 weeks ago         TREATMENT:  Therapeutic exercises: pulleys in flexion and scapular plane x20, and flexion ea direction., alt elbow extension/flexion  PROM of L shoulder x 31mins in shoulder elevation AAROM of L shoulder flexion/abduction in scapular plane x20 in supine, with dowel AROM of L shoulder flexion/ abduction in scapular plane (supine)x20 S/l shoulder abduction L x20 Seated AROM of L shoulder flexion/abduction/IR/ER 2x10 Finger walks in scapular plane x7 on L Doorway stretch for L shoulder ER 3x20s, towel roll for shoulder to remain in scapular plane   deltoid isometrics in scapular plane5s hold x15each direction for flexion, abduction, adduction, extension, shoulder IR and ER, large towel roll under elbow to prevent any shoulder extension.  (CP in place x7mins, unbilled)  Patient response to treatment: No complaints of pain at end of session, mild complaints of LUE fatigue.     PT Education - 08/10/18 8099    Education Details  therex  form/technique    Person(s) Educated  Patient    Methods  Explanation;Demonstration    Comprehension  Verbalized understanding;Returned demonstration          PT Long Term Goals - 08/03/18 1307      PT LONG TERM GOAL #1   Title  Patient will be independent with HEP to continue benefits of therapy after discharge.    Baseline  Dependent with exercise performance    Time  6    Period  Weeks    Status  On-going    Target Date  08/29/18      PT LONG TERM GOAL #2   Title  Patient will be able to achieve 120 deg of shoulder flexion AROM without any increase in pain to better be able to reach a high shelf.    Baseline  Unable to perform  AROM ; 80deg    Time  6    Period  Weeks    Status  On-going    Target Date  08/29/18      PT LONG TERM GOAL #3   Title  Patient will have a worst pain of 1/10 to better be able to perform House hold activities without high level of pain    Baseline  10/10 : 3/10, still progressing to return to PLOF    Time  6    Period  Weeks    Status  On-going    Target Date  08/29/18            Plan - 08/10/18 0953    Clinical Impression Statement  Patient demonstrated improved AAROM compared to previous sessions today, as well as AROM. Patient would benefit from continued therapy to continue to progress towards goals.    PT Frequency  2x / week    PT Duration  8 weeks    PT Treatment/Interventions  Therapeutic exercise;Therapeutic activities;Neuromuscular re-education;Patient/family education;Manual techniques;Passive range of motion;Dry needling;ADLs/Self Care Home Management;Cryotherapy;Electrical Stimulation;Ultrasound;Moist Heat;Iontophoresis 4mg /ml Dexamethasone    PT Next Visit Plan  progress isometrics, progress PROM. progress protocol after week 6    PT Home Exercise Plan  [id number] updated see patient instructions    Consulted and Agree with Plan of Care  Patient       Patient will benefit from skilled therapeutic intervention in order to improve the following deficits and impairments:  Pain, Increased muscle spasms, Decreased strength, Decreased endurance, Decreased range of motion, Decreased activity tolerance, Impaired sensation, Increased fascial restricitons, Decreased coordination, Decreased mobility, Postural dysfunction  Visit Diagnosis: Chronic left shoulder pain  Stiffness of left shoulder, not elsewhere classified  Muscle weakness (generalized)     Problem List Patient Active Problem List   Diagnosis Date Noted  . 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 Colon cancer screening   . Bilateral carpal tunnel syndrome 03/17/2017  .  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 Lumbar radiculitis 08/14/2014  . DDD (degenerative disc disease), lumbar 08/14/2014  . Sleep apnea 08/07/2014  . Obesity, unspecified 08/07/2014  . History of hypothyroidism 08/07/2014  . Chronic kidney disease 08/07/2014  . Arthritis 08/07/2014    Lieutenant Diego PT, DPT 10:07 AM,08/10/18 [place] [facility] [address] [place], [address] Phone: [phone]   Fax:  [phone]  Name: EVANGALINE JOU MRN: [id number] Date of Birth: 12-16-1950

## 2018-08-14 ENCOUNTER — Ambulatory Visit: Payer: Medicare Other

## 2018-08-14 DIAGNOSIS — G8929 Other chronic pain: Secondary | ICD-10-CM

## 2018-08-14 DIAGNOSIS — M25512 Pain in left shoulder: Principal | ICD-10-CM

## 2018-08-14 DIAGNOSIS — M6281 Muscle weakness (generalized): Secondary | ICD-10-CM

## 2018-08-14 DIAGNOSIS — M25612 Stiffness of left shoulder, not elsewhere classified: Secondary | ICD-10-CM

## 2018-08-14 NOTE — Therapy (Signed)
[facility] [address] [place], [address] Phone: [phone]   Fax:  [phone]  Physical Therapy Treatment  Patient Details  Name: Kelsey Woods MRN: [id number] Date of Birth: [birth year] Referring Provider (PT): Poggi MD   Encounter Date: 08/14/2018  PT End of Session - 08/14/18 1307    Visit Number  12    Number of Visits  17    Date for PT Re-Evaluation  08/28/18    PT Start Time  1302    PT Stop Time  1344    PT Time Calculation (min)  42 min    Activity Tolerance  Patient tolerated treatment well    Behavior During Therapy  [facility] for tasks assessed/performed       Past Medical History:  Diagnosis Date  . Acute appendicitis with perforation and peritoneal abscess   . Anxiety   . Arthritis   . Complication of anesthesia    swelling of lips after gallblader surgery  . GERD (gastroesophageal reflux disease)   . Hypothyroidism    h/o no meds now  . Ruptured appendicitis 11/28/2017  . Sleep apnea     Past Surgical History:  Procedure Laterality Date  . APPENDECTOMY    . BREAST BIOPSY Right 1980's   Benign  . CESAREAN SECTION    . CHOLECYSTECTOMY    . COLONOSCOPY WITH PROPOFOL N/A 12/29/2017   Procedure: COLONOSCOPY WITH PROPOFOL;  Surgeon: Lin Landsman, MD;  Location: [facility] ENDOSCOPY;  Service: Gastroenterology;  Laterality: N/A;  . DILATION AND CURETTAGE OF UTERUS    . JOINT REPLACEMENT Bilateral    knees  . JOINT REPLACEMENT Right    hip  . LAPAROSCOPIC APPENDECTOMY N/A 01/24/2018   Procedure: APPENDECTOMY LAPAROSCOPIC;  Surgeon: Olean Ree, MD;  Location: [facility] ORS;  Service: General;  Laterality: N/A;  . NASAL SEPTUM SURGERY    . REPLACEMENT TOTAL KNEE BILATERAL    . REVERSE SHOULDER ARTHROPLASTY Left 06/15/2018   Procedure: TOTAL .REVERSE SHOULDER ARTHROPLASTY;  Surgeon: Corky Mull, MD;  Location: [facility] ORS;  Service: Orthopedics;  Laterality: Left;  . TONSILLECTOMY     . TOTAL HIP ARTHROPLASTY      There were no vitals filed for this visit.  Subjective Assessment - 08/14/18 1306    Subjective  Patient reports that she had some soreness this morning, but it improved throughout her day.     Currently in Pain?  No/denies          TREATMENT:  Therapeutic exercises: pulleys in flexion and scapular plane x 50mins  and flexion ea direction., alt elbow extension/flexion PROM of L shoulder x 66mins in shoulder flex/abd/ER AAROM of L shoulder flexion/abduction in scapular plane x20 in supine, with dowel AROM with #1 weight of L shoulder flexion/ abduction in scapular plane (supine)x10, AROM x10 S/l shoulder abduction L x10, x10 with 1# dumbbell Seated AROM of L shoulder flexion/abduction/IR/ER x15 Finger walks in scapular plane/elevation x10 on L ea Doorway stretch for L shoulder ER 3x30s, towel roll for shoulder to remain in scapular plane    Manual therapy: x4mins STM  Superficial and deep tecniques to L UT/levator for tissue tension/pain, improvement in soft tissue by 50% and patient reported improvement in symptoms.       Patient response to treatment: No complaints of pain at end of session.     PT Education - 08/14/18 1307    Education Details  therex form/technique    Person(s) Educated  Patient    Methods  Explanation;Demonstration;Tactile cues    Comprehension  Verbalized understanding;Returned demonstration          PT Long Term Goals - 08/03/18 1307      PT LONG TERM GOAL #1   Title  Patient will be independent with HEP to continue benefits of therapy after discharge.    Baseline  Dependent with exercise performance    Time  6    Period  Weeks    Status  On-going    Target Date  08/29/18      PT LONG TERM GOAL #2   Title  Patient will be able to achieve 120 deg of shoulder flexion AROM without any increase in pain to better be able to reach a high shelf.    Baseline  Unable to perform AROM ; 80deg    Time  6     Period  Weeks    Status  On-going    Target Date  08/29/18      PT LONG TERM GOAL #3   Title  Patient will have a worst pain of 1/10 to better be able to perform House hold activities without high level of pain    Baseline  10/10 : 3/10, still progressing to return to PLOF    Time  6    Period  Weeks    Status  On-going    Target Date  08/29/18            Plan - 08/14/18 1335    Clinical Impression Statement  Patient with compensational L shoulder hiking with AAROM/AROM in standing/sitting, improvement with cues.     PT Frequency  2x / week    PT Duration  8 weeks    PT Treatment/Interventions  Therapeutic exercise;Therapeutic activities;Neuromuscular re-education;Patient/family education;Manual techniques;Passive range of motion;Dry needling;ADLs/Self Care Home Management;Cryotherapy;Electrical Stimulation;Ultrasound;Moist Heat;Iontophoresis 4mg /ml Dexamethasone       Patient will benefit from skilled therapeutic intervention in order to improve the following deficits and impairments:  Pain, Increased muscle spasms, Decreased strength, Decreased endurance, Decreased range of motion, Decreased activity tolerance, Impaired sensation, Increased fascial restricitons, Decreased coordination, Decreased mobility, Postural dysfunction  Visit Diagnosis: Chronic left shoulder pain  Stiffness of left shoulder, not elsewhere classified  Muscle weakness (generalized)     Problem List Patient Active Problem List   Diagnosis Date Noted  . 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 Colon cancer screening   . Bilateral carpal tunnel syndrome 03/17/2017  . 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 Lumbar radiculitis 08/14/2014  . DDD (degenerative disc disease), lumbar 08/14/2014  . Sleep apnea 08/07/2014  . Obesity, unspecified 08/07/2014  . History of hypothyroidism 08/07/2014  . Chronic kidney disease 08/07/2014  . Arthritis 08/07/2014    Lieutenant Diego PT, DPT 1:47 PM,08/14/18 [phone]  [facility] [address] [place], [address] Phone: [phone]   Fax:  [phone]  Name: Kelsey Woods MRN: [id number] Date of Birth: [birth year]

## 2018-08-17 ENCOUNTER — Ambulatory Visit: Payer: Medicare Other

## 2018-08-17 DIAGNOSIS — M25512 Pain in left shoulder: Principal | ICD-10-CM

## 2018-08-17 DIAGNOSIS — M6281 Muscle weakness (generalized): Secondary | ICD-10-CM

## 2018-08-17 DIAGNOSIS — M25612 Stiffness of left shoulder, not elsewhere classified: Secondary | ICD-10-CM

## 2018-08-17 DIAGNOSIS — G8929 Other chronic pain: Secondary | ICD-10-CM

## 2018-08-17 NOTE — Therapy (Signed)
[facility] [address], [place], [address] Phone: [phone]   Fax:  [phone]  Physical Therapy Treatment  Patient Details  Name: Kelsey Woods MRN: [id number] Date of Birth: 09-26-1951 Referring Provider (PT): Poggi MD   Encounter Date: 08/17/2018  PT End of Session - 08/17/18 1123    Visit Number  13    Number of Visits  17    Date for PT Re-Evaluation  08/28/18    PT Start Time  1120    PT Stop Time  1202    PT Time Calculation (min)  42 min    Activity Tolerance  Patient tolerated treatment well    Behavior During Therapy  [facility] for tasks assessed/performed       Past Medical History:  Diagnosis Date  . Acute appendicitis with perforation and peritoneal abscess   . Anxiety   . Arthritis   . Complication of anesthesia    swelling of lips after gallblader surgery  . GERD (gastroesophageal reflux disease)   . Hypothyroidism    h/o no meds now  . Ruptured appendicitis 11/28/2017  . Sleep apnea     Past Surgical History:  Procedure Laterality Date  . APPENDECTOMY    . BREAST BIOPSY Right 1980's   Benign  . CESAREAN SECTION    . CHOLECYSTECTOMY    . COLONOSCOPY WITH PROPOFOL N/A 12/29/2017   Procedure: COLONOSCOPY WITH PROPOFOL;  Surgeon: Lin Landsman, MD;  Location: [facility] ENDOSCOPY;  Service: Gastroenterology;  Laterality: N/A;  . DILATION AND CURETTAGE OF UTERUS    . JOINT REPLACEMENT Bilateral    knees  . JOINT REPLACEMENT Right    hip  . LAPAROSCOPIC APPENDECTOMY N/A 01/24/2018   Procedure: APPENDECTOMY LAPAROSCOPIC;  Surgeon: Olean Ree, MD;  Location: [facility] ORS;  Service: General;  Laterality: N/A;  . NASAL SEPTUM SURGERY    . REPLACEMENT TOTAL KNEE BILATERAL    . REVERSE SHOULDER ARTHROPLASTY Left 06/15/2018   Procedure: TOTAL .REVERSE SHOULDER ARTHROPLASTY;  Surgeon: Corky Mull, MD;  Location: [facility] ORS;  Service: Orthopedics;  Laterality: Left;  . TONSILLECTOMY     . TOTAL HIP ARTHROPLASTY      There were no vitals filed for this visit.  Subjective Assessment - 08/17/18 1121    Subjective  Patient reports that she ran into a doorframe yesterday and is bruised and sore from that run in on her L shoulder today. Mild discomfort in anterior/lateral shoulder at start of session.     Currently in Pain?  No/denies   "discomfort"   Pain Orientation  Left          TREATMENT:  Therapeutic exercises: pulleys in flexion and scapular plane x 63mins  and flexionea direction., alt elbow extension/flexion PROM of L shoulder x 76mins in shoulder flex/abd/ER AAROM of L shoulder flexion/abduction in scapular plane x20in supine, with dowel, slight overpressure from PT at end range AROM with #1 weight of L shoulder flexion/ abduction in scapular plane (supine)x10, AROM x10 S/l shoulder abduction L x10 with slight overpressure from PT at end range, x10 with 1# dumbbell Seated AROM of L shoulder flexion/abduction/IR/ER x15 with min A from PT at end range for 10 IR/ER in 60deg supported with 1# dumbbell 2x10 Wall slides  in scapular plane/elevation x7 on L ea Doorway stretch for L shoulder ER 3x30s, towel roll for shoulder to remain in scapular plane Rhythmic stabilization in ~60degs 3x10s Seated scapular retractions with RTB 2x10  Patient response to treatment: No complaints of pain at end of session, moderate complaints of fatigue during wall slides.     PT Education - 08/17/18 1123    Education Details  therex form/technique    Person(s) Educated  Patient    Methods  Explanation;Demonstration;Tactile cues    Comprehension  Verbalized understanding          PT Long Term Goals - 08/03/18 1307      PT LONG TERM GOAL #1   Title  Patient will be independent with HEP to continue benefits of therapy after discharge.    Baseline  Dependent with exercise performance    Time  6    Period  Weeks    Status  On-going    Target Date  08/29/18       PT LONG TERM GOAL #2   Title  Patient will be able to achieve 120 deg of shoulder flexion AROM without any increase in pain to better be able to reach a high shelf.    Baseline  Unable to perform AROM ; 80deg    Time  6    Period  Weeks    Status  On-going    Target Date  08/29/18      PT LONG TERM GOAL #3   Title  Patient will have a worst pain of 1/10 to better be able to perform House hold activities without high level of pain    Baseline  10/10 : 3/10, still progressing to return to PLOF    Time  6    Period  Weeks    Status  On-going    Target Date  08/29/18            Plan - 08/17/18 1206    Clinical Impression Statement  Patient needed cueing to perform scapular retraction during AAROM/AROM to promote better positioning/decrease compensations of L shoulder (shoulder hiking). Overall patient with complaints of fatigue at end of session, no significant increase in pain. The patient would benefit from further skilled PT in order to continue to progress towards goals.     PT Frequency  2x / week    PT Duration  8 weeks    PT Treatment/Interventions  Therapeutic exercise;Therapeutic activities;Neuromuscular re-education;Patient/family education;Manual techniques;Passive range of motion;Dry needling;ADLs/Self Care Home Management;Cryotherapy;Electrical Stimulation;Ultrasound;Moist Heat;Iontophoresis 4mg /ml Dexamethasone    PT Next Visit Plan  check protocol, progress AAROm and AROM as able    PT Home Exercise Plan  [id number] updated see patient instructions    Consulted and Agree with Plan of Care  Patient       Patient will benefit from skilled therapeutic intervention in order to improve the following deficits and impairments:  Pain, Increased muscle spasms, Decreased strength, Decreased endurance, Decreased range of motion, Decreased activity tolerance, Impaired sensation, Increased fascial restricitons, Decreased coordination, Decreased mobility, Postural dysfunction  Visit  Diagnosis: Chronic left shoulder pain  Stiffness of left shoulder, not elsewhere classified  Muscle weakness (generalized)     Problem List Patient Active Problem List   Diagnosis Date Noted  . 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 Colon cancer screening   . Bilateral carpal tunnel syndrome 03/17/2017  . 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 Lumbar radiculitis 08/14/2014  . DDD (degenerative disc disease), lumbar 08/14/2014  . Sleep apnea 08/07/2014  . Obesity, unspecified 08/07/2014  . History of hypothyroidism 08/07/2014  . Chronic kidney disease 08/07/2014  . Arthritis 08/07/2014    Lieutenant Diego PT, DPT [phone]  PM,08/17/18 [facility] [address] [place], [address] Phone: [phone]   Fax:  [phone]  Name: Kelsey Woods MRN: [id number] Date of Birth: 1951-03-07

## 2018-08-22 ENCOUNTER — Ambulatory Visit: Payer: Medicare Other | Admitting: Physical Therapy

## 2018-08-22 ENCOUNTER — Encounter: Payer: Self-pay | Admitting: Physical Therapy

## 2018-08-22 DIAGNOSIS — M25512 Pain in left shoulder: Secondary | ICD-10-CM | POA: Diagnosis not present

## 2018-08-22 DIAGNOSIS — M6281 Muscle weakness (generalized): Secondary | ICD-10-CM

## 2018-08-22 DIAGNOSIS — M25612 Stiffness of left shoulder, not elsewhere classified: Secondary | ICD-10-CM

## 2018-08-22 DIAGNOSIS — G8929 Other chronic pain: Secondary | ICD-10-CM

## 2018-08-22 NOTE — Therapy (Signed)
[facility] [address], [place], [address] Phone: [phone]   Fax:  [phone]  Physical Therapy Treatment  Patient Details  Name: Kelsey Woods MRN: [id number] Date of Birth: 1951/04/12 Referring Provider (PT): Poggi MD   Encounter Date: 08/22/2018  PT End of Session - 08/22/18 1412    Visit Number  14    Number of Visits  17    Date for PT Re-Evaluation  08/28/18    PT Start Time  3154    PT Stop Time  1400    PT Time Calculation (min)  55 min    Activity Tolerance  Patient tolerated treatment well;Patient limited by pain    Behavior During Therapy  [facility] for tasks assessed/performed       Past Medical History:  Diagnosis Date  . Acute appendicitis with perforation and peritoneal abscess   . Anxiety   . Arthritis   . Complication of anesthesia    swelling of lips after gallblader surgery  . GERD (gastroesophageal reflux disease)   . Hypothyroidism    h/o no meds now  . Ruptured appendicitis 11/28/2017  . Sleep apnea     Past Surgical History:  Procedure Laterality Date  . APPENDECTOMY    . BREAST BIOPSY Right 1980's   Benign  . CESAREAN SECTION    . CHOLECYSTECTOMY    . COLONOSCOPY WITH PROPOFOL N/A 12/29/2017   Procedure: COLONOSCOPY WITH PROPOFOL;  Surgeon: Lin Landsman, MD;  Location: [facility] ENDOSCOPY;  Service: Gastroenterology;  Laterality: N/A;  . DILATION AND CURETTAGE OF UTERUS    . JOINT REPLACEMENT Bilateral    knees  . JOINT REPLACEMENT Right    hip  . LAPAROSCOPIC APPENDECTOMY N/A 01/24/2018   Procedure: APPENDECTOMY LAPAROSCOPIC;  Surgeon: Olean Ree, MD;  Location: [facility] ORS;  Service: General;  Laterality: N/A;  . NASAL SEPTUM SURGERY    . REPLACEMENT TOTAL KNEE BILATERAL    . REVERSE SHOULDER ARTHROPLASTY Left 06/15/2018   Procedure: TOTAL .REVERSE SHOULDER ARTHROPLASTY;  Surgeon: Corky Mull, MD;  Location: [facility] ORS;  Service: Orthopedics;  Laterality:  Left;  . TONSILLECTOMY    . TOTAL HIP ARTHROPLASTY      There were no vitals filed for this visit.  Subjective Assessment - 08/22/18 1307    Subjective  Pt reports she is a bit sore but not really hurting after doing some light housework. Reports the bruise is improving. Reports her HEP is going well except she cannot remember everything she is supposed to be doing.     Pertinent History  B Knee replacement, appendix excision, R hip Replacement, Reverse total shoulder on the L,     Limitations  Lifting;House hold activities    Patient Stated Goals  Improve shoulder function and decrease pain     Pain Score  1     Pain Location  Shoulder    Pain Orientation  Left;Anterior;Proximal    Pain Descriptors / Indicators  Aching    Pain Onset  1 to 4 weeks ago    Pain Frequency  Constant       TREATMENT:  Therapeutic exercises: - pulleys in flexion and scapular planex 50minsand flexionea direction., alt elbow extension/flexion - Supine of L shoulder x 61mins in shoulderflex/abd/ER - AAROM of L shoulder flexion/abduction in scapular plane x20in supine, with dowel, slight overpressure - from PT at end range - AROMwith #1 weightof L shoulder flexion/ abduction in scapular plane (supine)x10, AROM x10 -  S/l shoulder abduction Lx10 with slight overpressure from PT at end range, x10 with 1# dumbbell - S/l serratus punch L with manual assistance to prevent shoulder elevation, x15 - Standing rows with scapular retraction for improved postural and shoulder girdle strengthening and mobility. Required instruction for technique and cuing to retract, posteriorly tilt, and depress scapulae. Red theraband, x 15 - Standing B SHLD extension with scapular retraction for improved postural and shoulder girdle strengthening and mobility. Required instruction for technique and cuing to retract, posteriorly tilt, and depress scapulae as well as keep elbows extended.  x15. Red theraband.   HEP updated:   Access Code: [email or web address]  URL: [email or web address]  Date: 08/22/2018  Prepared by: Rosita Kea   Exercises  Supine Shoulder Press with Dowel - 10 reps - 2 sets - 1x daily - 7x weekly  Supine Shoulder Protraction with Dowel - 10 reps - 2 sets - 1x daily - 7x weekly  Supine Shoulder Flexion Extension Full Range AROM - 15 reps - 1 sets - 3x daily - 7x weekly  Sidelying Shoulder Abduction - 15 reps - 1 sets - 3x daily - 7x weekly  Seated Shoulder External Rotation - 10 reps - 1 sets - 1x daily - 7x weekly  Seated Shoulder Shrugs - 10 reps - 3 sets - 2x daily - 7x weekly  Isometric Shoulder Abduction at Wall - 20 reps - 1 sets - 2x daily - 7x weekly  Isometric Shoulder Adduction - 20 reps - 1 sets - 2x daily - 7x weekly  Standing Isometric Shoulder Extension with Doorway - Arm Bent - 15 reps - 1 sets - 2x daily - 7x weekly  Isometric Shoulder Flexion at Wall - 20 reps - 1 sets - 2x daily - 7x weekly  Seated Elbow Flexion with Resistance - 10 reps - 3 sets - 1x daily - 7x weekly  Wrist Extension with Resistance - 10 reps - 3 sets - 1x daily - 7x weekly  Wrist Flexion with Resistance - 10 reps - 3 sets - 1x daily - 7x weekly  Forearm Supination with Resistance - 10 reps - 3 sets - 1x daily - 7x weekly  Forearm Pronation with Resistance - 10 reps - 3 sets - 1x daily - 7x weekly  Standing Bilateral Low Shoulder Row with Anchored Resistance - 10 reps - 2 sets - 1x daily - 7x weekly  Shoulder extension with resistance - Neutral - 10 reps - 2 sets - 1x daily - 7x weekly    Patient response to treatment: Patient response to treatment:  Pt tolerated treatment well. Pt was able to complete all exercises with minimal to no lasting increase in pain or discomfort. Pt demo reduced scapular awareness, strength, and control that is affecting her glenohumeral rhythm. Pt required cuing for proper technique and to facilitate improved neuromuscular control, strength, range of motion, and functional  ability. Progressed scapular strengthening and motion exercises with good tolerance.  Pt is making good progress towards goals.  Patient presents with significant ROM, strength, motor control, and pain impairments that are limiting ability to complete usual ADLs and functional activities such as lifting, reaching, washing dishes, playing with grandchildren without difficulty. Patient will benefit from skilled PT intervention to address current body structure impairments and activity limitations to improve function and work towards goals set in current POC.        PT Education - 08/22/18 1412    Education Details  therex form/technique, recovery process, pain as a guide,  scapular mechanics, updated HEP    Person(s) Educated  Patient    Methods  Explanation;Handout;Demonstration    Comprehension  Verbalized understanding;Returned demonstration          PT Long Term Goals - 08/22/18 1421      PT LONG TERM GOAL #1   Title  Patient will be independent with HEP to continue benefits of therapy after discharge.    Baseline  Dependent with exercise performance    Time  6    Period  Weeks    Status  On-going    Target Date  08/29/18      PT LONG TERM GOAL #2   Title  Patient will be able to achieve 120 deg of shoulder flexion AROM without any increase in pain to better be able to reach a high shelf.    Baseline  Unable to perform AROM ; 80deg    Time  6    Period  Weeks    Status  On-going      PT LONG TERM GOAL #3   Title  Patient will have a worst pain of 1/10 to better be able to perform House hold activities without high level of pain    Baseline  10/10 : 3/10, still progressing to return to PLOF    Time  6    Period  Weeks    Status  On-going    Target Date  08/29/18         Plan - 08/22/18 1416    Clinical Impression Statement  Pt tolerated treatment well. Pt was able to complete all exercises with minimal to no lasting increase in pain or discomfort. Pt demo reduced  scapular awareness, strength, and control that is affecting her glenohumeral rhythm. Pt required cuing for proper technique and to facilitate improved neuromuscular control, strength, range of motion, and functional ability. Progressed scapular strengthening and motion exercises with good tolerance.     PT Frequency  2x / week    PT Duration  8 weeks    PT Treatment/Interventions  Therapeutic exercise;Therapeutic activities;Neuromuscular re-education;Patient/family education;Manual techniques;Passive range of motion;Dry needling;ADLs/Self Care Home Management;Cryotherapy;Electrical Stimulation;Ultrasound;Moist Heat;Iontophoresis 4mg /ml Dexamethasone    PT Next Visit Plan  continue following protocol, focus on scapular awareness, control, and strength    PT Home Exercise Plan  [id number] updated see patient instructions    Consulted and Agree with Plan of Care  Patient       Patient will benefit from skilled therapeutic intervention in order to improve the following deficits and impairments:  Pain, Increased muscle spasms, Decreased strength, Decreased endurance, Decreased range of motion, Decreased activity tolerance, Impaired sensation, Increased fascial restricitons, Decreased coordination, Decreased mobility, Postural dysfunction  Visit Diagnosis: Chronic left shoulder pain  Stiffness of left shoulder, not elsewhere classified  Muscle weakness (generalized)    Problem List Patient Active Problem List   Diagnosis Date Noted  . 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 Colon cancer screening   . Bilateral carpal tunnel syndrome 03/17/2017  . 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 Lumbar radiculitis 08/14/2014  . DDD (degenerative disc disease), lumbar 08/14/2014  . Sleep apnea 08/07/2014  . Obesity, unspecified 08/07/2014  . History of hypothyroidism 08/07/2014  . Chronic kidney disease 08/07/2014  . Arthritis 08/07/2014    Everlean Alstrom. Graylon Good, PT,  DPT 08/22/18, 2:23 PM   [facility] [address], [place], [address] Phone: [phone]   Fax:  [phone]  Name: CURTINA GRILLS MRN: [id number] Date of Birth: [birth year]

## 2018-08-24 ENCOUNTER — Encounter: Payer: Self-pay | Admitting: Physical Therapy

## 2018-08-24 ENCOUNTER — Ambulatory Visit: Payer: Medicare Other | Admitting: Physical Therapy

## 2018-08-24 DIAGNOSIS — M25512 Pain in left shoulder: Secondary | ICD-10-CM | POA: Diagnosis not present

## 2018-08-24 DIAGNOSIS — M25612 Stiffness of left shoulder, not elsewhere classified: Secondary | ICD-10-CM

## 2018-08-24 DIAGNOSIS — M6281 Muscle weakness (generalized): Secondary | ICD-10-CM

## 2018-08-24 DIAGNOSIS — G8929 Other chronic pain: Secondary | ICD-10-CM

## 2018-08-24 NOTE — Therapy (Signed)
[facility] [address] [place], [address] Phone: [phone]   Fax:  [phone]  Physical Therapy Treatment  Patient Details  Name: Kelsey Woods MRN: [id number] Date of Birth: 1951-01-09 Referring Provider (PT): Poggi MD   Encounter Date: 08/24/2018  PT End of Session - 08/24/18 1922    Visit Number  15    Number of Visits  17    Date for PT Re-Evaluation  08/28/18    PT Start Time  1120    PT Stop Time  1210    PT Time Calculation (min)  50 min    Activity Tolerance  Patient tolerated treatment well;Patient limited by pain    Behavior During Therapy  [facility] for tasks assessed/performed       Past Medical History:  Diagnosis Date  . Acute appendicitis with perforation and peritoneal abscess   . Anxiety   . Arthritis   . Complication of anesthesia    swelling of lips after gallblader surgery  . GERD (gastroesophageal reflux disease)   . Hypothyroidism    h/o no meds now  . Ruptured appendicitis 11/28/2017  . Sleep apnea     Past Surgical History:  Procedure Laterality Date  . APPENDECTOMY    . BREAST BIOPSY Right 1980's   Benign  . CESAREAN SECTION    . CHOLECYSTECTOMY    . COLONOSCOPY WITH PROPOFOL N/A 12/29/2017   Procedure: COLONOSCOPY WITH PROPOFOL;  Surgeon: Lin Landsman, MD;  Location: [facility] ENDOSCOPY;  Service: Gastroenterology;  Laterality: N/A;  . DILATION AND CURETTAGE OF UTERUS    . JOINT REPLACEMENT Bilateral    knees  . JOINT REPLACEMENT Right    hip  . LAPAROSCOPIC APPENDECTOMY N/A 01/24/2018   Procedure: APPENDECTOMY LAPAROSCOPIC;  Surgeon: Olean Ree, MD;  Location: [facility] ORS;  Service: General;  Laterality: N/A;  . NASAL SEPTUM SURGERY    . REPLACEMENT TOTAL KNEE BILATERAL    . REVERSE SHOULDER ARTHROPLASTY Left 06/15/2018   Procedure: TOTAL .REVERSE SHOULDER ARTHROPLASTY;  Surgeon: Corky Mull, MD;  Location: [facility] ORS;  Service: Orthopedics;  Laterality:  Left;  . TONSILLECTOMY    . TOTAL HIP ARTHROPLASTY      There were no vitals filed for this visit.  Subjective Assessment - 08/24/18 1114    Subjective  Patient reports she is feeling well today. She states she was sore for about a day after her last treatment session, but nothing she couldn't stand. She states she continues to perform exercises from her HEP daily but continues to be a bit unsure about how to perform some of them.     Pertinent History  B Knee replacement, appendix excision, R hip Replacement, Reverse total shoulder on the L,     Limitations  Lifting;House hold activities;Other (comment)    Diagnostic tests  MRI, Xray    Patient Stated Goals  Improve shoulder function and decrease pain     Currently in Pain?  Yes    Pain Score  1     Pain Location  Shoulder    Pain Orientation  Anterior;Left    Pain Descriptors / Indicators  Discomfort    Pain Type  Surgical pain    Pain Onset  1 to 4 weeks ago          TREATMENT:  Therapeutic exercises: - pulleys in flexion and scapular planex 30 repseach direction. - Standing rows with scapular retraction for improved postural and shoulder girdle strengthening  and mobility. Required instruction for technique and cuing to retract, posteriorly tilt, and depress scapulae. Red theraband, x 30 - seated bilateral shoulder ER with cuing to complete properly with scapular retraction, x 30 - AAROM of L shoulder flexion in scapular plane x30in supine, x20 in reclined position, with dowel  - Supine chest press with dowel with serratus punch, x 20 in supine and x 10 in reclined position.  - S/l serratus punch L with manual assistance to prevent shoulder elevation, x15 - AROMwith #1 weightof L shoulder abduction in scapular plane sidelying, reclined x10,  - S/l serratus punch L with manual assistance to prevent shoulder elevation, x15 - sidelying left shoulder horizontal ABD against gravity with scapular retraction, x20  Manual  therapy: to improve scapular strength, improve range of motion, neuromodulation, in order to promote improved ability to complete functional activities. - sidelying scapular D2 and D1 PNF pattern with manual resistance, x 10 each direction.   Patient response to treatment: Pt tolerated treatment well. Pt was able to complete all exercises with minimal to no lasting increase in pain or discomfort. Pt demo reduced scapular awareness, strength, and control that is affecting her glenohumeral rhythm but this improved with multimodal cuing. Pt required cuing for proper technique and to facilitate improved neuromuscular control, strength, range of motion, and functional ability. Progressed scapular strengthening and motion exercises with good tolerance.  Pt is making good progress towards goals.  Patient presents with significant ROM, strength, motor control, and pain impairments that are limiting ability to complete usual ADLs and functional activities such as lifting, reaching, washing dishes, playing with grandchildren without difficulty. Patient will benefit from skilled PT intervention to address current body structure impairments and activity limitations to improve function and work towards goals set in current POC.     PT Education - 08/24/18 1921    Education Details  scapular mechanics and rationale for exercise, HEP instructions, therex form/technique    Person(s) Educated  Patient    Methods  Explanation;Demonstration    Comprehension  Verbalized understanding;Returned demonstration          PT Long Term Goals - 08/22/18 1421      PT LONG TERM GOAL #1   Title  Patient will be independent with HEP to continue benefits of therapy after discharge.    Baseline  Dependent with exercise performance    Time  6    Period  Weeks    Status  On-going    Target Date  08/29/18      PT LONG TERM GOAL #2   Title  Patient will be able to achieve 120 deg of shoulder flexion AROM without any  increase in pain to better be able to reach a high shelf.    Baseline  Unable to perform AROM ; 80deg    Time  6    Period  Weeks    Status  On-going      PT LONG TERM GOAL #3   Title  Patient will have a worst pain of 1/10 to better be able to perform House hold activities without high level of pain    Baseline  10/10 : 3/10, still progressing to return to PLOF    Time  6    Period  Weeks    Status  On-going    Target Date  08/29/18            Plan - 08/24/18 1923    Clinical Impression Statement  Pt tolerated treatment well.  Pt was able to complete all exercises with minimal to no lasting increase in pain or discomfort. Pt demo reduced scapular awareness, strength, and control that is affecting her glenohumeral rhythm but this improved with multimodal cuing. Pt required cuing for proper technique and to facilitate improved neuromuscular control, strength, range of motion, and functional ability. Progressed scapular strengthening and motion exercises with good tolerance.     PT Frequency  2x / week    PT Duration  8 weeks    PT Treatment/Interventions  Therapeutic exercise;Therapeutic activities;Neuromuscular re-education;Patient/family education;Manual techniques;Passive range of motion;Dry needling;ADLs/Self Care Home Management;Cryotherapy;Electrical Stimulation;Ultrasound;Moist Heat;Iontophoresis 4mg /ml Dexamethasone    PT Next Visit Plan  continue following protocol, focus on scapular awareness, control, and strength    PT Home Exercise Plan  medbridge: [email or web address]    Consulted and Agree with Plan of Care  Patient       Patient will benefit from skilled therapeutic intervention in order to improve the following deficits and impairments:  Pain, Increased muscle spasms, Decreased strength, Decreased endurance, Decreased range of motion, Decreased activity tolerance, Impaired sensation, Increased fascial restricitons, Decreased coordination, Decreased mobility, Postural  dysfunction  Visit Diagnosis: Chronic left shoulder pain  Stiffness of left shoulder, not elsewhere classified  Muscle weakness (generalized)     Problem List Patient Active Problem List   Diagnosis Date Noted  . 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 Colon cancer screening   . Bilateral carpal tunnel syndrome 03/17/2017  . 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 Lumbar radiculitis 08/14/2014  . DDD (degenerative disc disease), lumbar 08/14/2014  . Sleep apnea 08/07/2014  . Obesity, unspecified 08/07/2014  . History of hypothyroidism 08/07/2014  . Chronic kidney disease 08/07/2014  . Arthritis 08/07/2014    Everlean Alstrom. Graylon Good, PT, DPT 08/24/18, 7:45 PM  [facility] [address] [place], [address] Phone: [phone]   Fax:  [phone]  Name: Kelsey Woods MRN: [id number] Date of Birth: 1951/06/27

## 2018-08-28 ENCOUNTER — Ambulatory Visit: Payer: Medicare Other | Admitting: Physical Therapy

## 2018-08-28 ENCOUNTER — Encounter: Payer: Self-pay | Admitting: Physical Therapy

## 2018-08-28 DIAGNOSIS — G8929 Other chronic pain: Secondary | ICD-10-CM

## 2018-08-28 DIAGNOSIS — M25512 Pain in left shoulder: Secondary | ICD-10-CM | POA: Diagnosis not present

## 2018-08-28 DIAGNOSIS — M25612 Stiffness of left shoulder, not elsewhere classified: Secondary | ICD-10-CM

## 2018-08-28 DIAGNOSIS — M6281 Muscle weakness (generalized): Secondary | ICD-10-CM

## 2018-08-28 NOTE — Therapy (Addendum)
[facility] [address] [place], [address] Phone: [phone]   Fax:  [phone]  Physical Therapy Re-Certification and Treatment   Dates of reporting period  08/03/2018   to   08/28/2018  Patient Details  Name: Kelsey Woods MRN: [id number] Date of Birth: 1950-11-16 Referring Provider (PT): Poggi MD   Encounter Date: 08/28/2018  PT End of Session - 08/28/18 1413    Visit Number  16    Number of Visits  17    Date for PT Re-Evaluation  08/28/18    PT Start Time  6237    PT Stop Time  1350    PT Time Calculation (min)  47 min    Activity Tolerance  Patient tolerated treatment well;Patient limited by pain;Patient limited by fatigue    Behavior During Therapy  [facility] for tasks assessed/performed       Past Medical History:  Diagnosis Date  . Acute appendicitis with perforation and peritoneal abscess   . Anxiety   . Arthritis   . Complication of anesthesia    swelling of lips after gallblader surgery  . GERD (gastroesophageal reflux disease)   . Hypothyroidism    h/o no meds now  . Ruptured appendicitis 11/28/2017  . Sleep apnea     Past Surgical History:  Procedure Laterality Date  . APPENDECTOMY    . BREAST BIOPSY Right 1980's   Benign  . CESAREAN SECTION    . CHOLECYSTECTOMY    . COLONOSCOPY WITH PROPOFOL N/A 12/29/2017   Procedure: COLONOSCOPY WITH PROPOFOL;  Surgeon: Lin Landsman, MD;  Location: [facility] ENDOSCOPY;  Service: Gastroenterology;  Laterality: N/A;  . DILATION AND CURETTAGE OF UTERUS    . JOINT REPLACEMENT Bilateral    knees  . JOINT REPLACEMENT Right    hip  . LAPAROSCOPIC APPENDECTOMY N/A 01/24/2018   Procedure: APPENDECTOMY LAPAROSCOPIC;  Surgeon: Olean Ree, MD;  Location: [facility] ORS;  Service: General;  Laterality: N/A;  . NASAL SEPTUM SURGERY    . REPLACEMENT TOTAL KNEE BILATERAL    . REVERSE SHOULDER ARTHROPLASTY Left 06/15/2018   Procedure: TOTAL .REVERSE  SHOULDER ARTHROPLASTY;  Surgeon: Corky Mull, MD;  Location: [facility] ORS;  Service: Orthopedics;  Laterality: Left;  . TONSILLECTOMY    . TOTAL HIP ARTHROPLASTY      There were no vitals filed for this visit.  Subjective Assessment - 08/28/18 1316    Subjective  Patient reports she is feeling well. Went to yearly physical today and doctor reccomended she discontinue Nexium for a while but bloodwork looked good. She states both shoulders have been hurting now and relates this to the colder weather and possibly HEP getting a bit harder.  States she has started driving again successfully.     Pertinent History  B Knee replacement, appendix excision, R hip Replacement, Reverse total shoulder on the L,     Limitations  Lifting;House hold activities;Other (comment)    Diagnostic tests  MRI, Xray    Patient Stated Goals  Improve shoulder function and decrease pain     Currently in Pain?  Yes    Pain Score  1     Pain Location  Shoulder    Pain Orientation  Left    Pain Descriptors / Indicators  Discomfort    Pain Onset  1 to 4 weeks ago    Pain Frequency  Constant         TREATMENT:  Therapeutic exercises: -pulleys in flexion  and scapular plane2x 2 min repseach direction. - Seated lat pull down with guarding and assistance with bar in positions of discomfort or restriciton. 5# focus on scapular depression before and during pull. x10. -Standing rows with scapular retraction for improved postural and shoulder girdle strengthening and mobility. Required instruction for technique and cuing to retract, posteriorly tilt, and depress scapulae.15# x 10, 10# x 20 - Standing B SHLD extension with scapular retraction for improved postural and shoulder girdle strengthening and mobility. Required instruction for technique and cuing to retract, posteriorly tilt, and depress scapulae as well as keep elbows extended.  2x15, red theraband.  - standing bilateral tricep extensions with scapular depression,  red theraband, x 30.  - s/l L shoulder ER with cuing to complete properly with scapular retraction, 2x15 -AAROM of L shoulder flexion with dowel with 2# weight on it, 2x15, in reclined position, with dowel  - Supine chest press with dowel with serratus punch and 2# weight, x 2x15 in reclined position.  - S/l serratus punch L with manual cuing to prevent shoulder elevation, angled position 1# weight, 2x15 -AROML shoulder abduction in scapular plane sidelying in angled position,  x10, with 1# DB, x 20 without weight.,  - S/l serratus punch L with manual assistance to prevent shoulder elevation, x15 - sidelying left shoulder horizontal ABD against gravity with scapular retraction, 2x15 - Education on HEP including handout    Manual therapy: to improve scapular strength, improve range of motion, neuromodulation, in order to promote improved ability to complete functional activities. - sidelying scapular D2 and D1 PNF pattern with manual resistance, x 20 each direction.   Access Code: [email or web address]  URL: [email or web address]  Date: 08/28/2018  Prepared by: Rosita Kea   Program Notes  SHOULDER AWAY FROM YOUR EAR!  Reduce sets to 2 if it is too sore.    Exercises  Supine Shoulder Flexion with Dowel - 3 sets - 10-15 reps - 5 second hold - 1x daily  Supine Shoulder Press with Dowel - 10 reps - 3 sets - 1x daily - 7x weekly  Supine Shoulder Protraction with Dowel - 10 reps - 3 sets - 1x daily - 7x weekly  Sidelying Shoulder Abduction - 10 reps - 1 sets - 3x daily - 7x weekly  Standing Bilateral Low Shoulder Row with Anchored Resistance - 10 reps - 3 sets - 1x daily - 7x weekly  Shoulder extension with resistance - Neutral - 10 reps - 3 sets - 1x daily - 7x weekly  Sidelying Shoulder Horizontal Abduction - 3 sets - 10 reps - 1 second hold - 1x daily - 7x weekly  Sidelying Shoulder Flexion 15 Degrees - 2 sets - 10 reps - 1 second hold - 1x daily    Patient response to  treatment: Pt tolerated treatment well. Pt was able to complete all exercises with minimal to no lasting increase in pain or discomfort.Pt demo reduced scapular awareness, strength, and control that is affecting her glenohumeral rhythm but this improved with multimodal cuing and showed some improvement from last visit. Pt reported fatigue with most exercises. Pt required cuing for proper technique and to facilitate improved neuromuscular control, strength, range of motion, and functional ability. Progressed scapular strengthening and motion exercises with good tolerance. Pt is makinggoodprogress towards goals.  ASSESSMENT:   Patient has completed 16 visits and continues to progress gradually towards goals. Pt has been progressing from ROM to strengthening exercises as outlined in protocol.At this point the focus is on scapular  strengthening and improving glenohumeral rhythm dynamics for flexion and abduction as well as gradually building strength and muscular endurance for these motions with correct movement pattern. Pt continues to have stiffness and lacks PROM for overhead movement and has difficulty with elevating UE without shoulder hike. Pt demonstrates progress in activity tolerance at each visit.   Patient presents with significantROM, strength, motor control, and painimpairments that are limiting ability to complete usual ADLs and functional activities such as lifting, reaching, washing dishes, playing with grandchildrenwithout difficulty. Patient will benefit from skilled PT intervention to address current body structure impairments and activity limitations to improve function and work towards goals set in current POC.   PT Education - 08/28/18 1406    Education Details  cuing for improved scapular mechanics, updated HEP, therex form/technique    Person(s) Educated  Patient    Methods  Explanation;Demonstration    Comprehension  Verbalized understanding;Returned demonstration           PT Long Term Goals - 08/28/18 1415      PT LONG TERM GOAL #1   Title  Patient will be independent with HEP to continue benefits of therapy after discharge.    Baseline  Continues to complete HEP independently as appropriate for overall progress but has not yet reached final HEP for disharge.     Time  8    Period  Weeks    Status  Partially Met    Target Date  10/09/18      PT LONG TERM GOAL #2   Title  Patient will be able to achieve 120 deg of shoulder flexion AROM without any increase in pain to better be able to reach a high shelf.    Baseline  Unable to perform AROM ; 80deg; 80 degrees with shoulder hike and discomfort (08/28/2018)    Time  8    Period  Weeks    Status  On-going    Target Date  10/09/18      PT LONG TERM GOAL #3   Title  Patient will have a worst pain of 1/10 to better be able to perform House hold activities without high level of pain    Baseline  10/10 : 3/10, still progressing to return to PLOF; 3/10, still progression return to PLOF (08/28/2018/-)    Time  8    Period  Weeks    Status  On-going         Plan - 08/28/18 1414    Clinical Impression Statement  Patient has completed 16 visits and continues to progress gradually towards goals. Pt has been progressing from ROM to strengthening exercises as outlined in protocol.At this point the focus is on scapular strengthening and improving glenohumeral rhythm dynamics for flexion and abduction as well as gradually building strength and muscular endurance for these motions with correct movement pattern. Pt continues to have stiffness and lacks PROM for overhead movement and has difficulty with elevating UE without shoulder hike. Pt demonstrates progress in activity tolerance at each visit.     PT Frequency  2x / week    PT Duration  8 weeks    PT Treatment/Interventions  Therapeutic exercise;Therapeutic activities;Neuromuscular re-education;Patient/family education;Manual techniques;Passive range of  motion;Dry needling;ADLs/Self Care Home Management;Cryotherapy;Electrical Stimulation;Ultrasound;Moist Heat;Iontophoresis '4mg'$ /ml Dexamethasone    PT Next Visit Plan  continue following protocol, focus on scapular awareness, control, and strength    PT Home Exercise Plan  medbridge: [email or web address]    Consulted and Agree with Plan of Care  Patient  Patient will benefit from skilled therapeutic intervention in order to improve the following deficits and impairments:  Pain, Increased muscle spasms, Decreased strength, Decreased endurance, Decreased range of motion, Decreased activity tolerance, Impaired sensation, Increased fascial restricitons, Decreased coordination, Decreased mobility, Postural dysfunction  Visit Diagnosis: Chronic left shoulder pain  Stiffness of left shoulder, not elsewhere classified  Muscle weakness (generalized)     Problem List Patient Active Problem List   Diagnosis Date Noted  . 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 Colon cancer screening   . Bilateral carpal tunnel syndrome 03/17/2017  . 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 Lumbar radiculitis 08/14/2014  . DDD (degenerative disc disease), lumbar 08/14/2014  . Sleep apnea 08/07/2014  . Obesity, unspecified 08/07/2014  . History of hypothyroidism 08/07/2014  . Chronic kidney disease 08/07/2014  . Arthritis 08/07/2014   Everlean Alstrom. Graylon Good, PT, DPT 08/28/18, 2:26 PM   [facility] [address], [place], [address] Phone: [phone]   Fax:  [phone]  Name: TALESHIA LUFF MRN: [id number] Date of Birth: 1951/02/27

## 2018-08-30 ENCOUNTER — Encounter: Payer: Self-pay | Admitting: Physical Therapy

## 2018-08-30 ENCOUNTER — Ambulatory Visit: Payer: Medicare Other | Admitting: Physical Therapy

## 2018-08-30 DIAGNOSIS — G8929 Other chronic pain: Secondary | ICD-10-CM

## 2018-08-30 DIAGNOSIS — M25512 Pain in left shoulder: Secondary | ICD-10-CM | POA: Diagnosis not present

## 2018-08-30 DIAGNOSIS — M25612 Stiffness of left shoulder, not elsewhere classified: Secondary | ICD-10-CM

## 2018-08-30 DIAGNOSIS — M6281 Muscle weakness (generalized): Secondary | ICD-10-CM

## 2018-08-30 NOTE — Therapy (Signed)
[place] [facility] [address], [place], [address] Phone: [phone]   Fax:  [phone]  Physical Therapy Treatment  Patient Details  Name: Kelsey Woods MRN: [id number] Date of Birth: June 12, 1951 Referring Provider (PT): Poggi MD   Encounter Date: 08/30/2018  PT End of Session - 08/30/18 1405    Visit Number  17    Number of Visits  32    Date for PT Re-Evaluation  08/28/18    Authorization Type  [employer] Medicare    Authorization Time Period  Cert period 85/63/1497 through 10/23/2018    Authorization - Visit Number  2    Authorization - Number of Visits  10    PT Start Time  1300    PT Stop Time  1350    PT Time Calculation (min)  50 min    Activity Tolerance  Patient tolerated treatment well;Patient limited by pain;Patient limited by fatigue    Behavior During Therapy  [facility] for tasks assessed/performed       Past Medical History:  Diagnosis Date  . Acute appendicitis with perforation and peritoneal abscess   . Anxiety   . Arthritis   . Complication of anesthesia    swelling of lips after gallblader surgery  . GERD (gastroesophageal reflux disease)   . Hypothyroidism    h/o no meds now  . Ruptured appendicitis 11/28/2017  . Sleep apnea     Past Surgical History:  Procedure Laterality Date  . APPENDECTOMY    . BREAST BIOPSY Right 1980's   Benign  . CESAREAN SECTION    . CHOLECYSTECTOMY    . COLONOSCOPY WITH PROPOFOL N/A 12/29/2017   Procedure: COLONOSCOPY WITH PROPOFOL;  Surgeon: Lin Landsman, MD;  Location: [facility] ENDOSCOPY;  Service: Gastroenterology;  Laterality: N/A;  . DILATION AND CURETTAGE OF UTERUS    . JOINT REPLACEMENT Bilateral    knees  . JOINT REPLACEMENT Right    hip  . LAPAROSCOPIC APPENDECTOMY N/A 01/24/2018   Procedure: APPENDECTOMY LAPAROSCOPIC;  Surgeon: Olean Ree, MD;  Location: [facility] ORS;  Service: General;  Laterality: N/A;  . NASAL SEPTUM SURGERY    .  REPLACEMENT TOTAL KNEE BILATERAL    . REVERSE SHOULDER ARTHROPLASTY Left 06/15/2018   Procedure: TOTAL .REVERSE SHOULDER ARTHROPLASTY;  Surgeon: Corky Mull, MD;  Location: [facility] ORS;  Service: Orthopedics;  Laterality: Left;  . TONSILLECTOMY    . TOTAL HIP ARTHROPLASTY      There were no vitals filed for this visit.  Subjective Assessment - 08/30/18 1258    Subjective  Patient reports she did not feel well yesterday and her right shoulder has some pain that she attributes to getting the flu shot. She is feeling a bit better today overall. She states she felt sore in both shoulder for the remainder of the day after last visit and is continuing to complete her HEP. She noticed a twinge when she does band exercises with the left shoulder    Pertinent History  B Knee replacement, appendix excision, R hip Replacement, Reverse total shoulder on the L,     Limitations  Lifting;House hold activities;Other (comment)    Diagnostic tests  MRI, Xray    Patient Stated Goals  Improve shoulder function and decrease pain     Currently in Pain?  Yes    Pain Score  1     Pain Location  Shoulder   soreness   Pain Orientation  Left    Pain  Descriptors / Indicators  --   soreness   Pain Type  Surgical pain    Pain Onset  More than a month ago         [facility] PT Assessment - 08/30/18 0001      AROM   Right Shoulder Flexion  145 Degrees    Right Shoulder ABduction  146 Degrees    Right Shoulder Internal Rotation  --   T4 apley's   Right Shoulder External Rotation  85 Degrees   90 degrees abduction   Left Shoulder Flexion  80 Degrees   compensatory shoulder hike   Left Shoulder ABduction  80 Degrees   compensatory shoulder hike   Left Shoulder External Rotation  34 Degrees   shoulder in neutral abduction     PROM   Left Shoulder Flexion  125 Degrees    Left Shoulder ABduction  100 Degrees    Left Shoulder Internal Rotation  70 Degrees    Left Shoulder External Rotation  36 Degrees         PT Education - 08/30/18 1302    Education Details  cuing for improved scapular mechanics, therex form/technique    Person(s) Educated  Patient    Methods  Explanation;Demonstration    Comprehension  Verbalized understanding;Returned demonstration         TREATMENT:  Therapeutic exercises: -pulleys in flexion and scapular planex 3 min repseachdirection. - Seated lat pull down with guarding and assistance with bar in positions of discomfort or restriciton. 5# focus on scapular depression before and during pull. x10. -Standing rows with scapular retraction for improved postural and shoulder girdle strengthening and mobility. Required instruction for technique and cuing to retract, posteriorly tilt, and depress scapulae.red theraband, x30 - Standing B SHLD extension with scapular retraction for improved postural and shoulder girdle strengthening and mobility. Required instruction for technique and cuing to retract, posteriorly tilt, and depress scapulae as well as keep elbows extended.  x30, red theraband.  -AROM of L shoulder flexion against yellow theraband adjusted to apply resistance from gravity neutral overhead, held by PT, 2x10, in supine position -s/l AROM L shoulder scaption gainst yellow theraband adjusted to apply resistance from gravity neutral overhead, held by PT, 2x10. - supine serratus punch/hold with purturbations  L with manual cuing to prevent shoulder elevation, angled position 1# weight, 2x15 -AROML shoulder abduction in scapular planesidelying in angled position, x10, with 1# DB, x 20 without weight.,  - S/l serratus punch L and perturbations 3x 45 seconds, 1# weight last two sets, cuing to prevent shoulder elevation - sidelying left shoulder horizontal ABD against gravity with scapular retraction, 2x10 (2nd set with 1# weight) - sidelying left shoulder flexion x10 with cuing to prevent shoulder elevation - AROM measurements bilateral sides: flexion,  abduction, ER, IR (right only) to assess progress.  - Review of HEP and exercises that patient expressed confusion about.   Manual therapy: toimprove scapular strength, improve range of motion, neuromodulation, in order to promote improved ability to complete functional activities. - sidelying scapular D2 and D1 PNF pattern with manual resistance, x 20 each direction. - PROM with gentle OP, L shoulder flexion, abduction, and ER with measurements taken to assess progress.   Access Code: [email or web address]  URL: [email or web address]  Date: 08/28/2018  Prepared by: Rosita Kea   Program Notes  SHOULDER AWAY FROM YOUR EAR!  Reduce sets to 2 if it is too sore.    Exercises   Supine Shoulder Flexion with Dowel - 3 sets -  10-15 reps - 5 second hold - 1x daily   Supine Shoulder Press with Dowel - 10 reps - 3 sets - 1x daily - 7x weekly   Supine Shoulder Protraction with Dowel - 10 reps - 3 sets - 1x daily - 7x weekly   Sidelying Shoulder Abduction - 10 reps - 1 sets - 3x daily - 7x weekly   Standing Bilateral Low Shoulder Row with Anchored Resistance - 10 reps - 3 sets - 1x daily - 7x weekly   Shoulder extension with resistance - Neutral - 10 reps - 3 sets - 1x daily - 7x weekly   Sidelying Shoulder Horizontal Abduction - 3 sets - 10 reps - 1 second hold - 1x daily - 7x weekly   Sidelying Shoulder Flexion 15 Degrees - 2 sets - 10 reps - 1 second hold - 1x daily    Patient response to treatment: Pt tolerated treatment well. Pt was able to complete all exercises with minimal to no lasting increase in pain or discomfort. She experienced a "locking" sensation when trying to allow left shoulder to go overhead during eccentric portion of lat pulls, so shrugs were completed with shoulder in less scaption range. Pt demo reduced but improving scapular awareness, strength, and control that is affecting her glenohumeral rhythmbut this continues to improve with multimodal cuing.  Patient able to upwardly rotate scapula during flexion/scaption in supine/sidelying but unable in standing. Pt reported fatigue with most exercises. Pt required cuing for proper technique and to facilitate improved neuromuscular control, strength, range of motion, and functional ability. Progressed scapular strengthening and motion exercises with good tolerance. Pt is makinggoodprogress towards goals.  PT Long Term Goals - 08/30/18 1408      PT LONG TERM GOAL #1   Title  Patient will be independent with HEP to continue benefits of therapy after discharge.    Baseline  Continues to complete HEP independently as appropriate for overall progress but has not yet reached final HEP for disharge.     Time  8    Period  Weeks    Status  Partially Met    Target Date  10/23/18      PT LONG TERM GOAL #2   Title  Patient will be able to achieve 120 deg of shoulder flexion AROM without any increase in pain to better be able to reach a high shelf.    Baseline  Unable to perform AROM ; 80deg; 80 degrees with shoulder hike and discomfort (08/28/2018)    Time  8    Period  Weeks    Status  On-going    Target Date  10/23/18      PT LONG TERM GOAL #3   Title  Patient will have a worst pain of 1/10 to better be able to perform House hold activities without high level of pain    Baseline  10/10 : 3/10, still progressing to return to PLOF; 3/10, still progression return to PLOF (08/28/2018/-)    Time  8    Period  Weeks    Status  On-going    Target Date  10/23/18            Plan - 08/30/18 1404    Clinical Impression Statement  Pt tolerated treatment well. Pt was able to complete all exercises with minimal to no lasting increase in pain or discomfort. She experienced a "locking" sensation when trying to allow left shoulder to go overhead during eccentric portion of lat pulls, so shrugs  were completed with shoulder in less scaption range. Pt demo reduced but improving scapular awareness, strength,  and control that is affecting her glenohumeral rhythmbut this continues to improve with multimodal cuing. Patient able to upwardly rotate scapula during flexion/scaption in supine/sidelying but unable in standing. Pt reported fatigue with most exercises. Pt required cuing for proper technique and to facilitate improved neuromuscular control, strength, range of motion, and functional ability. Progressed scapular strengthening and motion exercises with good tolerance.    PT Frequency  2x / week    PT Duration  8 weeks    PT Treatment/Interventions  Therapeutic exercise;Therapeutic activities;Neuromuscular re-education;Patient/family education;Manual techniques;Passive range of motion;Dry needling;ADLs/Self Care Home Management;Cryotherapy;Electrical Stimulation;Ultrasound;Moist Heat;Iontophoresis 76m/ml Dexamethasone    PT Next Visit Plan  continue following protocol, focus on scapular awareness, control, and strength. Progress band exercises.     PT Home Exercise Plan  medbridge: [email or web address]   Consulted and Agree with Plan of Care  Patient       Patient will benefit from skilled therapeutic intervention in order to improve the following deficits and impairments:  Pain, Increased muscle spasms, Decreased strength, Decreased endurance, Decreased range of motion, Decreased activity tolerance, Impaired sensation, Increased fascial restricitons, Decreased coordination, Decreased mobility, Postural dysfunction  Visit Diagnosis: Chronic left shoulder pain  Stiffness of left shoulder, not elsewhere classified  Muscle weakness (generalized)     Problem List Patient Active Problem List   Diagnosis Date Noted  . 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 Colon cancer screening   . Bilateral carpal tunnel syndrome 03/17/2017  . 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 Lumbar radiculitis 08/14/2014  . DDD (degenerative disc disease), lumbar 08/14/2014  . Sleep apnea  08/07/2014  . Obesity, unspecified 08/07/2014  . History of hypothyroidism 08/07/2014  . Chronic kidney disease 08/07/2014  . Arthritis 08/07/2014    SNancy Nordmann PT, DPT 08/30/2018, 2:10 PM  [facility] [address] [place] [address]Phone: [phone]  Fax:  [phone] Name: Kelsey BOURNEMRN: [id number] of Birth: [birth year]

## 2018-09-04 ENCOUNTER — Encounter: Payer: Self-pay | Admitting: Physical Therapy

## 2018-09-04 ENCOUNTER — Ambulatory Visit: Payer: Medicare Other | Admitting: Physical Therapy

## 2018-09-04 DIAGNOSIS — M6281 Muscle weakness (generalized): Secondary | ICD-10-CM

## 2018-09-04 DIAGNOSIS — M25612 Stiffness of left shoulder, not elsewhere classified: Secondary | ICD-10-CM

## 2018-09-04 DIAGNOSIS — G8929 Other chronic pain: Secondary | ICD-10-CM

## 2018-09-04 DIAGNOSIS — M25512 Pain in left shoulder: Principal | ICD-10-CM

## 2018-09-04 NOTE — Therapy (Signed)
[place] [facility] [address], [place], [address] Phone: [phone]   Fax:  [phone]  Physical Therapy Treatment  Patient Details  Name: Kelsey Woods MRN: [id number] Date of Birth: 1951/07/14 Referring Provider (PT): Poggi MD   Encounter Date: 09/04/2018  PT End of Session - 09/04/18 1405    Visit Number  18    Number of Visits  32    Date for PT Re-Evaluation  10/23/18    Authorization Type  [employer] Medicare    Authorization Time Period  Cert period 56/43/3295 through 10/23/2018    Authorization - Visit Number  3    Authorization - Number of Visits  10    PT Start Time  1884    PT Stop Time  1350    PT Time Calculation (min)  45 min    Activity Tolerance  Patient tolerated treatment well;Patient limited by pain;Patient limited by fatigue    Behavior During Therapy  [facility] for tasks assessed/performed       Past Medical History:  Diagnosis Date  . Acute appendicitis with perforation and peritoneal abscess   . Anxiety   . Arthritis   . Complication of anesthesia    swelling of lips after gallblader surgery  . GERD (gastroesophageal reflux disease)   . Hypothyroidism    h/o no meds now  . Ruptured appendicitis 11/28/2017  . Sleep apnea     Past Surgical History:  Procedure Laterality Date  . APPENDECTOMY    . BREAST BIOPSY Right 1980's   Benign  . CESAREAN SECTION    . CHOLECYSTECTOMY    . COLONOSCOPY WITH PROPOFOL N/A 12/29/2017   Procedure: COLONOSCOPY WITH PROPOFOL;  Surgeon: Lin Landsman, MD;  Location: [facility] ENDOSCOPY;  Service: Gastroenterology;  Laterality: N/A;  . DILATION AND CURETTAGE OF UTERUS    . JOINT REPLACEMENT Bilateral    knees  . JOINT REPLACEMENT Right    hip  . LAPAROSCOPIC APPENDECTOMY N/A 01/24/2018   Procedure: APPENDECTOMY LAPAROSCOPIC;  Surgeon: Olean Ree, MD;  Location: [facility] ORS;  Service: General;  Laterality: N/A;  . NASAL SEPTUM SURGERY    .  REPLACEMENT TOTAL KNEE BILATERAL    . REVERSE SHOULDER ARTHROPLASTY Left 06/15/2018   Procedure: TOTAL .REVERSE SHOULDER ARTHROPLASTY;  Surgeon: Corky Mull, MD;  Location: [facility] ORS;  Service: Orthopedics;  Laterality: Left;  . TONSILLECTOMY    . TOTAL HIP ARTHROPLASTY      There were no vitals filed for this visit.  Subjective Assessment - 09/04/18 1303    Subjective  Patient reports she has some soreness in her left shoulder. She says she has been struggling with feeling sick and being itchy over the injection site for her flu shot. She had a sharp shooting pain in her posterior left shoulder yesterday night that happended when she unconsiously reacheded to the right shoulder to scratch. She states it hurt some during the night but feels fine now.  She notices no difference in her abilities after the sharp pain. She is also completing her HEP regularly. She states the sidelying horizontal abduction makes her sore faster. in the front.     Pertinent History  B Knee replacement, appendix excision, R hip Replacement, Reverse total shoulder on the L,     Limitations  Lifting;House hold activities;Other (comment)    Diagnostic tests  MRI, Xray    Patient Stated Goals  Improve shoulder function and decrease pain     Currently  in Pain?  Yes    Pain Score  1     Pain Location  Shoulder   soreness   Pain Orientation  Left    Pain Descriptors / Indicators  Sore    Pain Type  Surgical pain    Pain Onset  More than a month ago    Pain Frequency  Intermittent    Multiple Pain Sites  No           PT Education - 09/04/18 1312    Education Details  therex cuing    Person(s) Educated  Patient    Methods  Explanation;Demonstration    Comprehension  Verbalized understanding;Returned demonstration         TREATMENT: Not sensitive to latex.  11.5 weeks from surgery.   Therapeutic exercises: 2 minrepseachdirection. -AROM of L shoulder scaption against yellow theraband held by PT,  2x15,in supine position - supine chest press with serratus punch/hold L with verbal and intermittent tactilecuingto prevent shoulder elevation, angled position 2# weight,3x10 seconds - supine horizontal abduction against red theraband, 2x15  - S/l serratus punch L and perturbations 3x 45 seconds, 2# weight last two sets, cuing to prevent shoulder elevation - supine serratus punch with alphabet while holding 2# weight. Cuing for shoulder down.  - Supine D2 flexion pattern with left shoulder against yellow theraband, x 10 each side.  -Review of HEP and exercises that patient expressed confusion about.   Manual therapy: toimprove scapular strength, improve range of motion, neuromodulation, in order to promote improved ability to complete functional activities. - sidelying scapular D2 and D1 PNF pattern with manual resistance, x30each direction. - PROM with gentle OP, L shoulder flexion, abduction, and ER, 5 xec holds x 20 each direction.  HOME EXERCISE PROGRAM Access Code: [email or web address]  URL: [email or web address]  Date: 09/04/2018  Prepared by: Rosita Kea   Program Notes  SHOULDER AWAY FROM YOUR EAR!  Reduce sets to 2 if it is too sore.    Exercises  Supine Shoulder Flexion with Dowel - 3 sets - 10-15 reps - 5 second hold - 1x daily  Supine Shoulder Press with Dowel - 10 reps - 3 sets - 1x daily - 7x weekly  Supine Shoulder Protraction with Dowel - 10 reps - 3 sets - 1x daily - 7x weekly  Sidelying Shoulder Abduction - 10 reps - 1 sets - 3x daily - 7x weekly  Standing Bilateral Low Shoulder Row with Anchored Resistance - 10 reps - 3 sets - 1x daily - 7x weekly  Shoulder extension with resistance - Neutral - 10 reps - 3 sets - 1x daily - 7x weekly  Sidelying Shoulder Flexion 15 Degrees - 2 sets - 10 reps - 1 second hold - 1x daily  Supine Shoulder Horizontal Abduction with Resistance - 10-15 reps - 1 second hold - 2 Sets - 1x daily - 3x weekly     Patient  response to treatment: Pt tolerated treatment well. Pt was able to complete all exercises with mild increase in soreness. She reported end range pain with relief of discomfort upon release of position. She demonstrated improved isolation and activation of scapular musculature and was able to progress to band resisted shoulder exercises in supine. Pt demo reduced but improving scapular awareness, strength, and control that is affecting her glenohumeral rhythmbut this continues to improve with multimodal cuing. Pt reported fatigue with most exercises. Pt is makinggoodprogress towards goals.  PT Long Term Goals - 08/30/18 1408      PT  LONG TERM GOAL #1   Title  Patient will be independent with HEP to continue benefits of therapy after discharge.    Baseline  Continues to complete HEP independently as appropriate for overall progress but has not yet reached final HEP for disharge.     Time  8    Period  Weeks    Status  Partially Met    Target Date  10/23/18      PT LONG TERM GOAL #2   Title  Patient will be able to achieve 120 deg of shoulder flexion AROM without any increase in pain to better be able to reach a high shelf.    Baseline  Unable to perform AROM ; 80deg; 80 degrees with shoulder hike and discomfort (08/28/2018)    Time  8    Period  Weeks    Status  On-going    Target Date  10/23/18      PT LONG TERM GOAL #3   Title  Patient will have a worst pain of 1/10 to better be able to perform House hold activities without high level of pain    Baseline  10/10 : 3/10, still progressing to return to PLOF; 3/10, still progression return to PLOF (08/28/2018/-)    Time  8    Period  Weeks    Status  On-going    Target Date  10/23/18            Plan - 09/04/18 1406    Clinical Impression Statement  Pt tolerated treatment well. Pt was able to complete all exercises with mild increase in soreness. She reported end range pain with relief of discomfort upon release of position. She  demonstrated improved isolation and activation of scapular musculature and was able to progress to band resisted shoulder exercises in supine. Pt demo reduced but improving scapular awareness, strength, and control that is affecting her glenohumeral rhythmbut this continues to improve with multimodal cuing. Pt reported fatigue with most exercises.    PT Frequency  2x / week    PT Duration  8 weeks    PT Treatment/Interventions  Therapeutic exercise;Therapeutic activities;Neuromuscular re-education;Patient/family education;Manual techniques;Passive range of motion;Dry needling;ADLs/Self Care Home Management;Cryotherapy;Electrical Stimulation;Ultrasound;Moist Heat;Iontophoresis 73m/ml Dexamethasone    PT Next Visit Plan  continue following protocol, focus on scapular awareness, control, and strength. Progress band exercises. Check wall slides.     PT Home Exercise Plan  medbridge: [email or web address]   Consulted and Agree with Plan of Care  Patient       Patient will benefit from skilled therapeutic intervention in order to improve the following deficits and impairments:  Pain, Increased muscle spasms, Decreased strength, Decreased endurance, Decreased range of motion, Decreased activity tolerance, Impaired sensation, Increased fascial restricitons, Decreased coordination, Decreased mobility, Postural dysfunction  Visit Diagnosis: Chronic left shoulder pain  Stiffness of left shoulder, not elsewhere classified  Muscle weakness (generalized)     Problem List Patient Active Problem List   Diagnosis Date Noted  . 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 Colon cancer screening   . Bilateral carpal tunnel syndrome 03/17/2017  . 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 Lumbar radiculitis 08/14/2014  . DDD (degenerative disc disease), lumbar 08/14/2014  . Sleep apnea 08/07/2014  . Obesity, unspecified 08/07/2014  . History of hypothyroidism 08/07/2014  . Chronic kidney  disease 08/07/2014  . Arthritis 08/07/2014    SNancy Nordmann PT, DPT 09/04/2018, 2:14 PM  [facility] [address] [place], [address] Phone: [phone]   Fax:  [phone]  Name: BULAH LURIE MRN: [id number] Date of Birth: [birth year]

## 2018-09-07 ENCOUNTER — Encounter: Payer: Self-pay | Admitting: Physical Therapy

## 2018-09-07 ENCOUNTER — Ambulatory Visit: Payer: Medicare Other | Admitting: Physical Therapy

## 2018-09-07 DIAGNOSIS — G8929 Other chronic pain: Secondary | ICD-10-CM

## 2018-09-07 DIAGNOSIS — M25512 Pain in left shoulder: Secondary | ICD-10-CM | POA: Diagnosis not present

## 2018-09-07 DIAGNOSIS — M6281 Muscle weakness (generalized): Secondary | ICD-10-CM

## 2018-09-07 DIAGNOSIS — M25612 Stiffness of left shoulder, not elsewhere classified: Secondary | ICD-10-CM

## 2018-09-07 NOTE — Therapy (Signed)
[facility] [address] [place], [address] Phone: [phone]   Fax:  [phone]  Physical Therapy Treatment  Patient Details  Name: Kelsey Woods MRN: [id number] Date of Birth: 1951/06/27 Referring Provider (PT): Poggi MD   Encounter Date: 09/07/2018  PT End of Session - 09/07/18 1357    Visit Number  19    Number of Visits  32    Date for PT Re-Evaluation  10/23/18    Authorization Type  [employer] Medicare    Authorization Time Period  Cert period 32/67/1245 through 10/23/2018    Authorization - Visit Number  4    Authorization - Number of Visits  10    PT Start Time  8099    PT Stop Time  1440    PT Time Calculation (min)  45 min    Activity Tolerance  Patient tolerated treatment well;Patient limited by pain;Patient limited by fatigue    Behavior During Therapy  [facility] for tasks assessed/performed       Past Medical History:  Diagnosis Date  . Acute appendicitis with perforation and peritoneal abscess   . Anxiety   . Arthritis   . Complication of anesthesia    swelling of lips after gallblader surgery  . GERD (gastroesophageal reflux disease)   . Hypothyroidism    h/o no meds now  . Ruptured appendicitis 11/28/2017  . Sleep apnea     Past Surgical History:  Procedure Laterality Date  . APPENDECTOMY    . BREAST BIOPSY Right 1980's   Benign  . CESAREAN SECTION    . CHOLECYSTECTOMY    . COLONOSCOPY WITH PROPOFOL N/A 12/29/2017   Procedure: COLONOSCOPY WITH PROPOFOL;  Surgeon: Lin Landsman, MD;  Location: [facility] ENDOSCOPY;  Service: Gastroenterology;  Laterality: N/A;  . DILATION AND CURETTAGE OF UTERUS    . JOINT REPLACEMENT Bilateral    knees  . JOINT REPLACEMENT Right    hip  . LAPAROSCOPIC APPENDECTOMY N/A 01/24/2018   Procedure: APPENDECTOMY LAPAROSCOPIC;  Surgeon: Olean Ree, MD;  Location: [facility] ORS;  Service: General;  Laterality: N/A;  . NASAL SEPTUM SURGERY    .  REPLACEMENT TOTAL KNEE BILATERAL    . REVERSE SHOULDER ARTHROPLASTY Left 06/15/2018   Procedure: TOTAL .REVERSE SHOULDER ARTHROPLASTY;  Surgeon: Corky Mull, MD;  Location: [facility] ORS;  Service: Orthopedics;  Laterality: Left;  . TONSILLECTOMY    . TOTAL HIP ARTHROPLASTY      There were no vitals filed for this visit.  Subjective Assessment - 09/07/18 1401    Subjective  Patient reports minimal soreness today. States she felt better than usual after last treatment. Has been completing HEP.     Pertinent History  B Knee replacement, appendix excision, R hip Replacement, Reverse total shoulder on the L,     Limitations  Lifting;House hold activities;Other (comment)    Diagnostic tests  MRI, Xray    Patient Stated Goals  Improve shoulder function and decrease pain     Pain Score  1     Pain Location  Shoulder    Pain Orientation  Left    Pain Descriptors / Indicators  Sore    Pain Type  Surgical pain    Pain Onset  More than a month ago    Pain Frequency  Intermittent       PT Education - 09/07/18 1458    Education Details  threx cuing, rationale for exercises.     Person(s) Educated  Patient    Methods  Explanation;Demonstration    Comprehension  Verbalized understanding;Returned demonstration;Tactile cues required         TREATMENT: Not sensitive to latex.  ~12 weeks from surgery.   Therapeutic exercise: to centralize symptoms and improve ROM and strength required for successful completion of functional activities.  - Upper body ergometer base level  x 2.5  minutes forward and 2.5 minutes backwards to  encourage joint nutrition, warm tissue, induce analgesic effect of aerobic exercise, improve muscular strength and endurance,  and prepare for remainder of session. Subjective discussed during this time.  - AAROM wall slides flexion and scaption x 5 each with PT tactile cuing for scapular rotation. Discontinued due to pain and poor form.   - reclined L shoulder flexion against  red theraband with cuing for shoulder down and upward rotation of scapula to end range, x10 - sidelying, reclined L shoulder scaption against red theraband with cuing for shoulder down and upward rotation of scapula to end range, x10 - sidelying left shoulder ER with towel under elbow as bolster, 2x10 with intensive cuing to prevent compensations.  - supine horizontal abduction against red theraband, x30 - sidelying on elevated mat serratus punch with alphabet while holding 2# weight. Cuing for shoulder down.  - Supine D2 flexion pattern with left shoulder against red theraband, 2x 10 each side.  -Review of HEP.   Manual therapy: toimprove scapular strength, improve range of motion, neuromodulation, in order to promote improved ability to complete functional activities. - PROM with gentle OP, L shoulder flexion, abduction, and ER, 5 xec holds x 20 each direction. - STM to anterior shoulder soft tissue to improve mobility and decrease pain.   HOME EXERCISE PROGRAM Access Code: [email or web address]  URL: [email or web address]  Date: 09/04/2018  Prepared by: Rosita Kea   Program Notes  SHOULDER AWAY FROM YOUR EAR!  Reduce sets to 2 if it is too sore.    Exercises   Supine Shoulder Flexion with Dowel - 3 sets - 10-15 reps - 5 second hold - 1x daily   Supine Shoulder Press with Dowel - 10 reps - 3 sets - 1x daily - 7x weekly   Supine Shoulder Protraction with Dowel - 10 reps - 3 sets - 1x daily - 7x weekly   Sidelying Shoulder Abduction - 10 reps - 1 sets - 3x daily - 7x weekly   Standing Bilateral Low Shoulder Row with Anchored Resistance - 10 reps - 3 sets - 1x daily - 7x weekly   Shoulder extension with resistance - Neutral - 10 reps - 3 sets - 1x daily - 7x weekly   Sidelying Shoulder Flexion 15 Degrees - 2 sets - 10 reps - 1 second hold - 1x daily   Supine Shoulder Horizontal Abduction with Resistance - 10-15 reps - 1 second hold - 2 Sets - 1x daily - 3x weekly      Patient response to treatment: Pt tolerated treatment well. Pt was able to complete all exercises with mild increase in soreness. She reported end range pain with relief of discomfort upon release of position during manual therapy. She continues to have pain at anterior shoulder and down upper left arm that subsides after discontinuing movement. She demonstrated improved isolation and activation of scapular musculature and was able to progress to band resisted shoulder exercises but unable to complete wall slide or standing shoulder elevation without significant shoulder hike.Pt cont to demo reducedbut improvingscapular awareness, strength, and control that is affecting her  glenohumeral rhythmbut this continues to improvewith multimodal cuing. Pt reported fatigue with most exercises. Pt is makinggoodprogress towards goals.  PT Long Term Goals - 08/30/18 1408      PT LONG TERM GOAL #1   Title  Patient will be independent with HEP to continue benefits of therapy after discharge.    Baseline  Continues to complete HEP independently as appropriate for overall progress but has not yet reached final HEP for disharge.     Time  8    Period  Weeks    Status  Partially Met    Target Date  10/23/18      PT LONG TERM GOAL #2   Title  Patient will be able to achieve 120 deg of shoulder flexion AROM without any increase in pain to better be able to reach a high shelf.    Baseline  Unable to perform AROM ; 80deg; 80 degrees with shoulder hike and discomfort (08/28/2018)    Time  8    Period  Weeks    Status  On-going    Target Date  10/23/18      PT LONG TERM GOAL #3   Title  Patient will have a worst pain of 1/10 to better be able to perform House hold activities without high level of pain    Baseline  10/10 : 3/10, still progressing to return to PLOF; 3/10, still progression return to PLOF (08/28/2018/-)    Time  8    Period  Weeks    Status  On-going    Target Date  10/23/18          Plan - 09/07/18 1358    Clinical Impression Statement  Pt tolerated treatment well. Pt was able to complete all exercises with mild increase in soreness. She reported end range pain with relief of discomfort upon release of position during manual therapy. She continues to have pain at anterior shoulder and down upper left arm that subsides after discontinuing movement. She demonstrated improved isolation and activation of scapular musculature and was able to progress to band resisted shoulder exercises but unable to complete wall slide or standing shoulder elevation without significant shoulder hike.Pt cont to demo reducedbut improvingscapular awareness, strength, and control that is affecting her glenohumeral rhythmbut this continues to improvewith multimodal cuing. Pt reported fatigue with most exercises.    PT Frequency  2x / week    PT Duration  8 weeks    PT Treatment/Interventions  Therapeutic exercise;Therapeutic activities;Neuromuscular re-education;Patient/family education;Manual techniques;Passive range of motion;Dry needling;ADLs/Self Care Home Management;Cryotherapy;Electrical Stimulation;Ultrasound;Moist Heat;Iontophoresis 86m/ml Dexamethasone    PT Next Visit Plan  continue following protocol, focus on scapular awareness, control, and strength. Progress band exercises. progress resistance as tolerated    PT Home Exercise Plan  medbridge: [email or web address]    Consulted and Agree with Plan of Care  Patient       Patient will benefit from skilled therapeutic intervention in order to improve the following deficits and impairments:  Pain, Increased muscle spasms, Decreased strength, Decreased endurance, Decreased range of motion, Decreased activity tolerance, Impaired sensation, Increased fascial restricitons, Decreased coordination, Decreased mobility, Postural dysfunction  Visit Diagnosis: Chronic left shoulder pain  Stiffness of left shoulder, not elsewhere classified  Muscle  weakness (generalized)     Problem List Patient Active Problem List   Diagnosis Date Noted  . 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 Colon cancer screening   . Bilateral carpal tunnel syndrome 03/17/2017  . Deficiency of alpha-galactosidase  07/27/2016  . Lumbar radiculitis 08/14/2014  . DDD (degenerative disc disease), lumbar 08/14/2014  . Sleep apnea 08/07/2014  . Obesity, unspecified 08/07/2014  . History of hypothyroidism 08/07/2014  . Chronic kidney disease 08/07/2014  . Arthritis 08/07/2014    Nancy Nordmann, PT, DPT 09/07/2018, 3:06 PM  [facility] [address] [place], [address] Phone: [phone]   Fax:  [phone]  Name: Kelsey Woods MRN: [id number] Date of Birth: 06/05/1951

## 2018-09-11 ENCOUNTER — Ambulatory Visit: Payer: Medicare Other | Attending: Surgery | Admitting: Physical Therapy

## 2018-09-11 ENCOUNTER — Encounter: Payer: Self-pay | Admitting: Physical Therapy

## 2018-09-11 DIAGNOSIS — M25512 Pain in left shoulder: Secondary | ICD-10-CM | POA: Diagnosis present

## 2018-09-11 DIAGNOSIS — G8929 Other chronic pain: Secondary | ICD-10-CM | POA: Diagnosis present

## 2018-09-11 DIAGNOSIS — M6281 Muscle weakness (generalized): Secondary | ICD-10-CM | POA: Diagnosis present

## 2018-09-11 DIAGNOSIS — M25612 Stiffness of left shoulder, not elsewhere classified: Secondary | ICD-10-CM | POA: Diagnosis present

## 2018-09-11 NOTE — Therapy (Signed)
[facility] [address], [place], [address] Phone: [phone]   Fax:  [phone]  Physical Therapy Treatment  Patient Details  Name: Kelsey Woods MRN: [id number] Date of Birth: 1951/09/04 Referring Provider (PT): Poggi MD   Encounter Date: 09/11/2018  PT End of Session - 09/11/18 1046    Visit Number  20    Number of Visits  32    Date for PT Re-Evaluation  10/23/18    Authorization Type  [employer] Medicare    Authorization Time Period  Cert period 70/62/3762 through 10/23/2018    Authorization - Visit Number  5    Authorization - Number of Visits  10    PT Start Time  8315    PT Stop Time  1115    PT Time Calculation (min)  40 min    Activity Tolerance  Patient tolerated treatment well;Patient limited by pain;Patient limited by fatigue    Behavior During Therapy  [facility] for tasks assessed/performed       Past Medical History:  Diagnosis Date  . Acute appendicitis with perforation and peritoneal abscess   . Anxiety   . Arthritis   . Complication of anesthesia    swelling of lips after gallblader surgery  . GERD (gastroesophageal reflux disease)   . Hypothyroidism    h/o no meds now  . Ruptured appendicitis 11/28/2017  . Sleep apnea     Past Surgical History:  Procedure Laterality Date  . APPENDECTOMY    . BREAST BIOPSY Right 1980's   Benign  . CESAREAN SECTION    . CHOLECYSTECTOMY    . COLONOSCOPY WITH PROPOFOL N/A 12/29/2017   Procedure: COLONOSCOPY WITH PROPOFOL;  Surgeon: Lin Landsman, MD;  Location: [facility] ENDOSCOPY;  Service: Gastroenterology;  Laterality: N/A;  . DILATION AND CURETTAGE OF UTERUS    . JOINT REPLACEMENT Bilateral    knees  . JOINT REPLACEMENT Right    hip  . LAPAROSCOPIC APPENDECTOMY N/A 01/24/2018   Procedure: APPENDECTOMY LAPAROSCOPIC;  Surgeon: Olean Ree, MD;  Location: [facility] ORS;  Service: General;  Laterality: N/A;  . NASAL SEPTUM SURGERY    .  REPLACEMENT TOTAL KNEE BILATERAL    . REVERSE SHOULDER ARTHROPLASTY Left 06/15/2018   Procedure: TOTAL .REVERSE SHOULDER ARTHROPLASTY;  Surgeon: Corky Mull, MD;  Location: [facility] ORS;  Service: Orthopedics;  Laterality: Left;  . TONSILLECTOMY    . TOTAL HIP ARTHROPLASTY      There were no vitals filed for this visit.  Subjective Assessment - 09/11/18 1042    Subjective  Patient states she has pain and soreness in the left anterior shoulder today. She stated she started getting some sharp pains in that location yesterday while completing scapular protraction in the recliner instead of supine like usual. She states she is not feeling the sharp pain as often since it started but it can happen when she is not doing anything. She states she does not remember how she felt after her last PT treatment. Doesn't think she took any type of pain medication or tylenol.     Pertinent History  B Knee replacement, appendix excision, R hip Replacement, Reverse total shoulder on the L,     Limitations  Lifting;House hold activities;Other (comment)    Diagnostic tests  MRI, Xray    Patient Stated Goals  Improve shoulder function and decrease pain     Pain Score  1     Pain Location  Shoulder  Pain Orientation  Left;Upper;Anterior    Pain Descriptors / Indicators  Sore    Pain Type  Surgical pain    Pain Onset  More than a month ago        PT Education - 09/11/18 1046    Education Details  therex cuing, rationale for exercises    Person(s) Educated  Patient    Methods  Explanation;Demonstration    Comprehension  Verbalized understanding;Returned demonstration         TREATMENT: Not sensitive to latex.  ~12 weeks from surgery.  Therapeutic exercise: to centralize symptoms and improve ROM and strength required for successful completion of functional activities.  - Upper body ergometerbase level  x 2.5  minutes forward and 2.5 minutes backwards to encourage joint nutrition, warm tissue, induce  analgesic effect of aerobic exercise, improve muscular strength and endurance, and prepare for remainder of session. Subjective discussed during this time.    - reclined L shoulder flexion against 2# dumbbell with cuing for shoulder down and upward rotation of scapula to end range, 2x6 (to form failure) - sidelying, reclined L shoulder ER to abduction press and protraction holding 2# dumbell with cuing for shoulder down and upward rotation of scapula to end range, 2x10 - sidelying left shoulder ER with towel under elbow as bolster, 2x10 with 2# dumbbell, with moderate cuing to prevent compensations.  - reclined isometric B shoulder IR against theraball, 5 second holds, 2x20 pain free.  - sidelying reach with left shoulder behind sacrum to pain free range, x 10 -Review of HEP.   Manual therapy: toimprove scapular strength, improve range of motion, neuromodulation, in order to promote improved ability to complete functional activities. - PROM with gentle OP, L shoulder flexion, abduction, and ER, 5 xec holds x 20 each direction.    HOME EXERCISE PROGRAM Access Code: [email or web address]  URL: [email or web address]  Date: 09/04/2018  Prepared by: Rosita Kea   Program Notes  SHOULDER AWAY FROM YOUR EAR!  Reduce sets to 2 if it is too sore.    Exercises   Supine Shoulder Flexion with Dowel - 3 sets - 10-15 reps - 5 second hold - 1x daily   Supine Shoulder Press with Dowel - 10 reps - 3 sets - 1x daily - 7x weekly   Supine Shoulder Protraction with Dowel - 10 reps - 3 sets - 1x daily - 7x weekly   Sidelying Shoulder Abduction - 10 reps - 1 sets - 3x daily - 7x weekly   Standing Bilateral Low Shoulder Row with Anchored Resistance - 10 reps - 3 sets - 1x daily - 7x weekly   Shoulder extension with resistance - Neutral - 10 reps - 3 sets - 1x daily - 7x weekly   Sidelying Shoulder Flexion 15 Degrees - 2 sets - 10 reps - 1 second hold - 1x daily   Supine Shoulder Horizontal  Abduction with Resistance - 10-15 reps - 1 second hold - 2 Sets - 1x daily - 3x weekly    Patient response to treatment: Pt tolerated treatment well. Pt was able to complete all exercises withmild increase in soreness. She continued to reported end range pain with relief of discomfort upon release of position during manual therapy. She continues to have pain at anterior shoulder and down upper left arm that subsides after discontinuing movement.  This was most notable with reclined position was moved more vertical. Noted for improved form and required less cuing with 2# dumbell with multiple exercises. She demonstrated improved  isolation and activation of scapular musculature and was able to progress shoulder elevation exercises.Pt cont to demo reducedbut improvingscapular awareness, strength, and control that is affecting her glenohumeral rhythmbut this continues to improvewith multimodal cuing. Pt reported fatigue with most exercises. Pt is makingprogress towards goals.  PT Long Term Goals - 08/30/18 1408      PT LONG TERM GOAL #1   Title  Patient will be independent with HEP to continue benefits of therapy after discharge.    Baseline  Continues to complete HEP independently as appropriate for overall progress but has not yet reached final HEP for disharge.     Time  8    Period  Weeks    Status  Partially Met    Target Date  10/23/18      PT LONG TERM GOAL #2   Title  Patient will be able to achieve 120 deg of shoulder flexion AROM without any increase in pain to better be able to reach a high shelf.    Baseline  Unable to perform AROM ; 80deg; 80 degrees with shoulder hike and discomfort (08/28/2018)    Time  8    Period  Weeks    Status  On-going    Target Date  10/23/18      PT LONG TERM GOAL #3   Title  Patient will have a worst pain of 1/10 to better be able to perform House hold activities without high level of pain    Baseline  10/10 : 3/10, still progressing to  return to PLOF; 3/10, still progression return to PLOF (08/28/2018/-)    Time  8    Period  Weeks    Status  On-going    Target Date  10/23/18            Plan - 09/11/18 1245    Clinical Impression Statement  Pt tolerated treatment well. Pt was able to complete all exercises withmild increase in soreness. She continued to reported end range pain with relief of discomfort upon release of position during manual therapy. She continues to have pain at anterior shoulder and down upper left arm that subsides after discontinuing movement.  This was most notable with reclined position was moved more vertical. Noted for improved form and required less cuing with 2# dumbell with multiple exercises. She demonstrated improved isolation and activation of scapular musculature and was able to progress shoulder elevation exercises.Pt cont to demo reducedbut improvingscapular awareness, strength, and control that is affecting her glenohumeral rhythmbut this continues to improvewith multimodal cuing. Pt reported fatigue with most exercises.    Rehab Potential  Good    Clinical Impairments Affecting Rehab Potential  (+) highly motivated (-) chronicity of pain    PT Frequency  2x / week    PT Duration  8 weeks    PT Treatment/Interventions  Therapeutic exercise;Therapeutic activities;Neuromuscular re-education;Patient/family education;Manual techniques;Passive range of motion;Dry needling;ADLs/Self Care Home Management;Cryotherapy;Electrical Stimulation;Ultrasound;Moist Heat;Iontophoresis 47m/ml Dexamethasone    PT Next Visit Plan  continue following protocol, focus on scapular awareness, control, and strength. Progress band exercises. progress resistance as tolerated    PT Home Exercise Plan  medbridge: [email or web address]    Consulted and Agree with Plan of Care  Patient       Patient will benefit from skilled therapeutic intervention in order to improve the following deficits and impairments:  Pain, Increased  muscle spasms, Decreased strength, Decreased endurance, Decreased range of motion, Decreased activity tolerance, Impaired sensation, Increased fascial restricitons, Decreased coordination, Decreased mobility,  Postural dysfunction  Visit Diagnosis: Chronic left shoulder pain  Stiffness of left shoulder, not elsewhere classified  Muscle weakness (generalized)     Problem List Patient Active Problem List   Diagnosis Date Noted  . 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 Colon cancer screening   . Bilateral carpal tunnel syndrome 03/17/2017  . 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 Lumbar radiculitis 08/14/2014  . DDD (degenerative disc disease), lumbar 08/14/2014  . Sleep apnea 08/07/2014  . Obesity, unspecified 08/07/2014  . History of hypothyroidism 08/07/2014  . Chronic kidney disease 08/07/2014  . Arthritis 08/07/2014    Nancy Nordmann, PT, DPT 09/11/2018, 12:46 PM  [facility] [address] [place], [address] Phone: [phone]   Fax:  [phone]  Name: Kelsey Woods MRN: [id number] Date of Birth: 01/04/1951

## 2018-09-13 ENCOUNTER — Ambulatory Visit: Payer: Medicare Other | Admitting: Physical Therapy

## 2018-09-13 ENCOUNTER — Encounter: Payer: Self-pay | Admitting: Physical Therapy

## 2018-09-13 DIAGNOSIS — M25512 Pain in left shoulder: Secondary | ICD-10-CM | POA: Diagnosis not present

## 2018-09-13 DIAGNOSIS — M6281 Muscle weakness (generalized): Secondary | ICD-10-CM

## 2018-09-13 DIAGNOSIS — M25612 Stiffness of left shoulder, not elsewhere classified: Secondary | ICD-10-CM

## 2018-09-13 DIAGNOSIS — G8929 Other chronic pain: Secondary | ICD-10-CM

## 2018-09-13 NOTE — Therapy (Signed)
[facility] [place] [facility] [address], [place], [address] Phone: [phone]   Fax:  [phone]  Physical Therapy Treatment  Patient Details  Name: Kelsey Woods MRN: [id number] Date of Birth: Aug 09, 1951 Referring Provider (PT): Poggi MD   Encounter Date: 09/13/2018  PT End of Session - 09/13/18 1119    Visit Number  21    Number of Visits  32    Date for PT Re-Evaluation  10/23/18    Authorization Type  [employer] Medicare    Authorization Time Period  Cert period 47/07/6282 through 10/23/2018    Authorization - Visit Number  6    Authorization - Number of Visits  10    PT Start Time  1120    PT Stop Time  1215    PT Time Calculation (min)  55 min    Activity Tolerance  Patient tolerated treatment well;Patient limited by pain;Patient limited by fatigue    Behavior During Therapy  [facility] for tasks assessed/performed       Past Medical History:  Diagnosis Date  . Acute appendicitis with perforation and peritoneal abscess   . Anxiety   . Arthritis   . Complication of anesthesia    swelling of lips after gallblader surgery  . GERD (gastroesophageal reflux disease)   . Hypothyroidism    h/o no meds now  . Ruptured appendicitis 11/28/2017  . Sleep apnea     Past Surgical History:  Procedure Laterality Date  . APPENDECTOMY    . BREAST BIOPSY Right 1980's   Benign  . CESAREAN SECTION    . CHOLECYSTECTOMY    . COLONOSCOPY WITH PROPOFOL N/A 12/29/2017   Procedure: COLONOSCOPY WITH PROPOFOL;  Surgeon: Lin Landsman, MD;  Location: [facility] ENDOSCOPY;  Service: Gastroenterology;  Laterality: N/A;  . DILATION AND CURETTAGE OF UTERUS    . JOINT REPLACEMENT Bilateral    knees  . JOINT REPLACEMENT Right    hip  . LAPAROSCOPIC APPENDECTOMY N/A 01/24/2018   Procedure: APPENDECTOMY LAPAROSCOPIC;  Surgeon: Olean Ree, MD;  Location: [facility] ORS;  Service: General;  Laterality: N/A;  . NASAL SEPTUM SURGERY    .  REPLACEMENT TOTAL KNEE BILATERAL    . REVERSE SHOULDER ARTHROPLASTY Left 06/15/2018   Procedure: TOTAL .REVERSE SHOULDER ARTHROPLASTY;  Surgeon: Corky Mull, MD;  Location: [facility] ORS;  Service: Orthopedics;  Laterality: Left;  . TONSILLECTOMY    . TOTAL HIP ARTHROPLASTY      There were no vitals filed for this visit.  Subjective Assessment - 09/13/18 1118    Subjective  Patient reports her shoulder feels pretty good. It was pretty sore after her monday appointment 3/10 (took a tramadol to sleep and to "be on the safe side"). It felt better the next day as the day goes on.  She helped take care of kids (3, 5, and 46 yo) yesterday and they helped her take the 88# dog to the vet.  Compliant with HEP.     Pertinent History  B Knee replacement, appendix excision, R hip Replacement, Reverse total shoulder on the L,     Limitations  Lifting;House hold activities;Other (comment)    Diagnostic tests  MRI, Xray    Patient Stated Goals  Improve shoulder function and decrease pain     Currently in Pain?  Yes    Pain Score  1     Pain Location  Shoulder    Pain Orientation  Left;Upper;Anterior    Pain Descriptors /  Indicators  Sore    Pain Type  Surgical pain    Pain Onset  More than a month ago        PT Education - 09/13/18 1119    Education Details  activity cuing, rationale for interventions, instructions for home    Person(s) Educated  Patient    Methods  Explanation;Demonstration    Comprehension  Verbalized understanding;Returned demonstration       TREATMENT: Not sensitive to latex. ~12.5weeks from surgery.  Therapeutic exercise:to centralize symptoms and improve ROM and strength required for successful completion of functional activities.  -Upper body ergometerbase leveland 14mnutes backwards to encourage joint nutrition, warm tissue, induce analgesic effect of aerobic exercise, improve muscular strength and endurance, and prepare for remainder of session.Subjective  discussed during this time.  - reclined L shoulder flexion against 1# dumbbell with cuing for shoulder down and upward rotation of scapula to end range, 3x10 - sidelying, reclined L shoulder ER to abduction press and protraction holding 2# dumbell with cuing for shoulder down and upward rotation of scapula to end range, 3x10 - sidelying left shoulder abduction on reclined surface, with cuing for scapular depression and upward rotation, 2# dumbbell, 3x10. - reclined isometric B shoulder IR against theraball, 5 second holds, 2x20 pain free.  - sidelying reach with left shoulder behind sacrum to pain free range, x 10 - review of supine B shoulder horizontal abduction with red theraband. Required cuing for proper motion and to retract and depress scapulae.  - standing AAROM left shoulder IR/Ext to end range discomfort using dowel and towel, x 15, 5 sec hold.  -Education on HEP including handout and green theraband  Manual therapy: toimprove scapular strength, improve range of motion, neuromodulation, in order to promote improved ability to complete functional activities. - PROM with gentle OP, L shoulder flexion, abduction, and ER, and IR/Ext, 5 xec holds x 20 each direction.   HOME EXERCISE PROGRAM Access Code: [email or web address] URL: [email or web address]  Date: 09/04/2018  Prepared by: SRosita Kea  Program Notes  SHOULDER AWAY FROM YOUR EAR!  Reduce sets to 2 if it is too sore.    Exercises   Supine Shoulder Flexion with Dowel - 3 sets - 10-15 reps - 5 second hold - 1x daily   Supine Shoulder Press with Dowel - 10 reps - 3 sets - 1x daily - 7x weekly   Supine Shoulder Protraction with Dowel - 10 reps - 3 sets - 1x daily - 7x weekly   Sidelying Shoulder Abduction - 10 reps - 1 sets - 3x daily - 7x weekly   Standing Bilateral Low Shoulder Row with Anchored Resistance - 10 reps - 3 sets - 1x daily - 7x weekly   Shoulder extension with resistance - Neutral - 10 reps - 3  sets - 1x daily - 7x weekly   Sidelying Shoulder Flexion 15 Degrees - 2 sets - 10 reps - 1 second hold - 1x daily   Supine Shoulder Horizontal Abduction with Resistance - 10-15 reps - 1 second hold - 2 Sets - 1x daily - 3x weekly   Patient response to treatment: Pt tolerated treatment well. Pt was able to complete all exercises withmild increase in soreness. She continued to reported end range pain with relief of discomfort upon release of positionduring manual therapy. She was able to tolerate progressions into extension and IR.She continues to have pain at anterior shoulder and down upper left arm that subsides after discontinuing movement. She was able to  complete more sets of strengthening exercises with less cuing required for proper [place] rhythm. She demonstrated improved isolation and activation of scapular musculature and was able to continue progressing shoulder elevation exercises.Ptcont todemo reducedbut improvingscapular awareness, strength, and control that is affecting her glenohumeral rhythmbut this continues to improvewith multimodal cuing. Pt reported fatigue with most exercises. Pt is makinggradual progress towards goals.   PT Long Term Goals - 08/30/18 1408      PT LONG TERM GOAL #1   Title  Patient will be independent with HEP to continue benefits of therapy after discharge.    Baseline  Continues to complete HEP independently as appropriate for overall progress but has not yet reached final HEP for disharge.     Time  8    Period  Weeks    Status  Partially Met    Target Date  10/23/18      PT LONG TERM GOAL #2   Title  Patient will be able to achieve 120 deg of shoulder flexion AROM without any increase in pain to better be able to reach a high shelf.    Baseline  Unable to perform AROM ; 80deg; 80 degrees with shoulder hike and discomfort (08/28/2018)    Time  8    Period  Weeks    Status  On-going    Target Date  10/23/18      PT LONG TERM GOAL #3    Title  Patient will have a worst pain of 1/10 to better be able to perform House hold activities without high level of pain    Baseline  10/10 : 3/10, still progressing to return to PLOF; 3/10, still progression return to PLOF (08/28/2018/-)    Time  8    Period  Weeks    Status  On-going    Target Date  10/23/18            Plan - 09/13/18 1313    Clinical Impression Statement  Pt tolerated treatment well. Pt was able to complete all exercises withmild increase in soreness. She continued to reported end range pain with relief of discomfort upon release of positionduring manual therapy. She was able to tolerate progressions into extension and IR.She continues to have pain at anterior shoulder and down upper left arm that subsides after discontinuing movement. She was able to complete more sets of strengthening exercises with less cuing required for proper [place] rhythm. She demonstrated improved isolation and activation of scapular musculature and was able to continue progressing shoulder elevation exercises.Ptcont todemo reducedbut improvingscapular awareness, strength, and control that is affecting her glenohumeral rhythmbut this continues to improvewith multimodal cuing. Pt reported fatigue with most exercises.    Rehab Potential  Good    Clinical Impairments Affecting Rehab Potential  (+) highly motivated (-) chronicity of pain    PT Frequency  2x / week    PT Duration  8 weeks    PT Treatment/Interventions  Therapeutic exercise;Therapeutic activities;Neuromuscular re-education;Patient/family education;Manual techniques;Passive range of motion;Dry needling;ADLs/Self Care Home Management;Cryotherapy;Electrical Stimulation;Ultrasound;Moist Heat;Iontophoresis 78m/ml Dexamethasone    PT Next Visit Plan  continue following protocol, focus on scapular awareness, control, and strength. Progress band exercises. progress resistance as tolerated    PT Home Exercise Plan  medbridge: [email or web address]     Consulted and Agree with Plan of Care  Patient       Patient will benefit from skilled therapeutic intervention in order to improve the following deficits and impairments:  Pain, Increased muscle spasms, Decreased strength, Decreased  endurance, Decreased range of motion, Decreased activity tolerance, Impaired sensation, Increased fascial restricitons, Decreased coordination, Decreased mobility, Postural dysfunction  Visit Diagnosis: Chronic left shoulder pain  Stiffness of left shoulder, not elsewhere classified  Muscle weakness (generalized)     Problem List Patient Active Problem List   Diagnosis Date Noted  . 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 Colon cancer screening   . Bilateral carpal tunnel syndrome 03/17/2017  . 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 Lumbar radiculitis 08/14/2014  . DDD (degenerative disc disease), lumbar 08/14/2014  . Sleep apnea 08/07/2014  . Obesity, unspecified 08/07/2014  . History of hypothyroidism 08/07/2014  . Chronic kidney disease 08/07/2014  . Arthritis 08/07/2014    Nancy Nordmann, PT, DPT 09/13/2018, 1:14 PM  [place] [facility] [address] [place], [address] Phone: [phone]   Fax:  [phone]  Name: KAEYA SCHIFFER MRN: [id number] Date of Birth: 09/27/1951

## 2018-09-19 ENCOUNTER — Ambulatory Visit: Payer: Medicare Other | Admitting: Physical Therapy

## 2018-09-21 ENCOUNTER — Ambulatory Visit: Payer: Medicare Other | Admitting: Physical Therapy

## 2018-09-25 ENCOUNTER — Ambulatory Visit: Payer: Medicare Other | Admitting: Physical Therapy

## 2018-09-25 ENCOUNTER — Encounter: Payer: Self-pay | Admitting: Physical Therapy

## 2018-09-25 DIAGNOSIS — M25512 Pain in left shoulder: Principal | ICD-10-CM

## 2018-09-25 DIAGNOSIS — G8929 Other chronic pain: Secondary | ICD-10-CM

## 2018-09-25 DIAGNOSIS — M25612 Stiffness of left shoulder, not elsewhere classified: Secondary | ICD-10-CM

## 2018-09-25 DIAGNOSIS — M6281 Muscle weakness (generalized): Secondary | ICD-10-CM

## 2018-09-25 NOTE — Therapy (Signed)
[place] [facility] [address], [place], [address] Phone: [phone]   Fax:  [phone]  Physical Therapy Treatment  Patient Details  Name: Kelsey Woods MRN: [id number] Date of Birth: April 15, 1951 Referring Provider (PT): Poggi MD   Encounter Date: 09/25/2018  PT End of Session - 09/25/18 1417    Visit Number  22    Number of Visits  32    Date for PT Re-Evaluation  10/23/18    Authorization Type  [employer] Medicare    Authorization Time Period  Cert period 97/84/7841 through 10/23/2018    Authorization - Visit Number  7    Authorization - Number of Visits  10    PT Start Time  1300    PT Stop Time  1400    PT Time Calculation (min)  60 min    Activity Tolerance  Patient tolerated treatment well;Patient limited by pain;Patient limited by fatigue    Behavior During Therapy  [facility] for tasks assessed/performed       Past Medical History:  Diagnosis Date  . Acute appendicitis with perforation and peritoneal abscess   . Anxiety   . Arthritis   . Complication of anesthesia    swelling of lips after gallblader surgery  . GERD (gastroesophageal reflux disease)   . Hypothyroidism    h/o no meds now  . Ruptured appendicitis 11/28/2017  . Sleep apnea     Past Surgical History:  Procedure Laterality Date  . APPENDECTOMY    . BREAST BIOPSY Right 1980's   Benign  . CESAREAN SECTION    . CHOLECYSTECTOMY    . COLONOSCOPY WITH PROPOFOL N/A 12/29/2017   Procedure: COLONOSCOPY WITH PROPOFOL;  Surgeon: Lin Landsman, MD;  Location[facility] ENDOSCOPY;  Service: Gastroenterology;  Laterality: N/A;  . DILATION AND CURETTAGE OF UTERUS    . JOINT REPLACEMENT Bilateral    knees  . JOINT REPLACEMENT Right    hip  . LAPAROSCOPIC APPENDECTOMY N/A 01/24/2018   Procedure: APPENDECTOMY LAPAROSCOPIC;  Surgeon: Olean Ree, MD;  Location: [facility] ORS;  Service: General;  Laterality: N/A;  . NASAL SEPTUM SURGERY    .  REPLACEMENT TOTAL KNEE BILATERAL    . REVERSE SHOULDER ARTHROPLASTY Left 06/15/2018   Procedure: TOTAL .REVERSE SHOULDER ARTHROPLASTY;  Surgeon: Corky Mull, MD;  Location: [facility] ORS;  Service: Orthopedics;  Laterality: Left;  . TONSILLECTOMY    . TOTAL HIP ARTHROPLASTY      There were no vitals filed for this visit.  Subjective Assessment - 09/25/18 1306    Subjective  Patientwas unable to come to physcal therapy last week due to being sick. She eventually saw her physician who prescribed amoxycillin which she has been taking for a few days and is feeling better. She has stopped taking other OTC cold medication. she has not been able to do her shoulder exercises much while sick. She has done some on and off when she was not sleeping.  No pain today.  Pt reports she had a follow up with her referring physician who was pleased with her progrss at this point. He stated that either the PT would let her know when she plateued in progress or is ready for discharge from PT or insurance company would stop paying for therapy. He would like to see her back in 2 months. Pt is concerned she regressed since last visit not being able to come to PT.     Pertinent History  B Knee  replacement, appendix excision, R hip Replacement, Reverse total shoulder on the L,     Limitations  Lifting;House hold activities;Other (comment)    Diagnostic tests  MRI, Xray    Patient Stated Goals  Improve shoulder function and decrease pain     Currently in Pain?  No/denies    Pain Onset  More than a month ago        PT Education - 09/25/18 1307    Education Details  activity cuing, rationale for interventions    Person(s) Educated  Patient    Methods  Explanation;Demonstration    Comprehension  Verbalized understanding;Returned demonstration       OBJECTIVE: L Shoulder PROM in supine (pre-manual/post-manual):  - Flexion: 130/150 - Abduction: 108/125  - ER (at 90 degrees abduction): 30/did not  re-measure  TREATMENT: Not sensitive to latex. Surgery date: 06/15/2018  ~14.5weeks from surgery.  Therapeutic exercise:to centralize symptoms and improve ROM and strength required for successful completion of functional activities.  -Upper body ergometerbase leveland 110mnutes backwards to encourage joint nutrition, warm tissue, induce analgesic effect of aerobic exercise, improve muscular strength and endurance, and prepare for remainder of session.Subjective discussed during this time.  - reclined L shoulder flexion against2# dumbbellwith cuing for shoulder down and upward rotation of scapula to end range, 3x10 - sidelying, reclined Lshoulder ER to abduction press and protraction holding 2# dumbellwith cuing for shoulder down and upward rotation of scapula to end range, 2x10, supine reclined left shoulder ER against red theraband x 10 (3rd set) - sidelying left shoulder abduction on reclined surface, with cuing for scapular depression and upward rotation, 2# dumbbell, 3x10. Attempted 3# but unable after 2 reps. - reclined shoulder IR against red theraband,2x15 - wall slides x10 in flexion, x 10 in abduction with manual assistance to help upwardly rotate and depress scapula. Stayed at end range and last 4th of motion to encourage correct overhead mechanics.  - active scaption facing mirror with tactile cuing and AAROM to raise shoulder higher at end of set to demonstrate goal and ability to reach proper form with 90 degrees scaption with assistance. X 10 within able ROM (up to about 60 degrees).   Manual therapy: toimprove scapular strength, improve range of motion, neuromodulation, in order to promote improved ability to complete functional activities. - PROM with gentle OP, L shoulder flexion, abduction, and ER, and IR/Ext, 5 xec holds x 20 each direction.   HOME EXERCISE PROGRAM Access Code: [email or web address] URL: [email or web address]  Date: 09/04/2018  Prepared  by: SRosita Kea  Program Notes  SHOULDER AWAY FROM YOUR EAR!  Reduce sets to 2 if it is too sore.    Exercises   Supine Shoulder Flexion with Dowel - 3 sets - 10-15 reps - 5 second hold - 1x daily   Supine Shoulder Press with Dowel - 10 reps - 3 sets - 1x daily - 7x weekly   Supine Shoulder Protraction with Dowel - 10 reps - 3 sets - 1x daily - 7x weekly   Sidelying Shoulder Abduction - 10 reps - 1 sets - 3x daily - 7x weekly   Standing Bilateral Low Shoulder Row with Anchored Resistance - 10 reps - 3 sets - 1x daily - 7x weekly   Shoulder extension with resistance - Neutral - 10 reps - 3 sets - 1x daily - 7x weekly   Sidelying Shoulder Flexion 15 Degrees - 2 sets - 10 reps - 1 second hold - 1x daily   Supine Shoulder Horizontal  Abduction with Resistance - 10-15 reps - 1 second hold - 2 Sets - 1x daily - 3x weekly   Patient response to treatment: Pt tolerated treatment well. Pt was able to complete all exercises withmild increase in soreness. She was less limited by pain today and was able to progress strengthening exercises with improved form despite not being able to attend PT last week.Marland Kitchen She was able to tolerate progressions into extension and IR.Marland KitchenShe was able to complete increased weight in strengthening exercises with less cuing required for proper [place] rhythm. She demonstrated improved isolation and activation of scapular musculature and was able to continue progressingshoulder elevation exercises.Ptcont todemo reducedbut improvingscapular awareness, strength, and control that is affecting her glenohumeral rhythmbut this continues to improvewith multimodal cuing. Pt reported fatigue with most exercises. Pt continues toprogress towards goals.  PT Long Term Goals - 08/30/18 1408      PT LONG TERM GOAL #1   Title  Patient will be independent with HEP to continue benefits of therapy after discharge.    Baseline  Continues to complete HEP independently as appropriate  for overall progress but has not yet reached final HEP for disharge.     Time  8    Period  Weeks    Status  Partially Met    Target Date  10/23/18      PT LONG TERM GOAL #2   Title  Patient will be able to achieve 120 deg of shoulder flexion AROM without any increase in pain to better be able to reach a high shelf.    Baseline  Unable to perform AROM ; 80deg; 80 degrees with shoulder hike and discomfort (08/28/2018)    Time  8    Period  Weeks    Status  On-going    Target Date  10/23/18      PT LONG TERM GOAL #3   Title  Patient will have a worst pain of 1/10 to better be able to perform House hold activities without high level of pain    Baseline  10/10 : 3/10, still progressing to return to PLOF; 3/10, still progression return to PLOF (08/28/2018/-)    Time  8    Period  Weeks    Status  On-going    Target Date  10/23/18            Plan - 09/25/18 1417    Clinical Impression Statement  Ptd by pain today and was able to progress exercises with improved form despite not being able to attend PT last week. She was able to tolerate progressions into extension and IR.Marland KitchenShe was able to complete increased weight in strengthening exercises with less cuing required for proper [place] rhythm. She demonstrated improved isolation and activation of scapular musculature and was able to continue progressingshoulder elevation exercises.Ptcont todemo reducedbut improvingscapular awareness, strength, and control that is affecting her glenohumeral rhythmbut this continues to improvewith multimodal cuing. Pt reported fatigue with most exercises.    Rehab Potential  Good    Clinical Impairments Affecting Rehab Potential  (+) highly motivated (-) chronicity of pain    PT Frequency  2x / week    PT Duration  8 weeks    PT Treatment/Interventions  Therapeutic exercise;Therapeutic activities;Neuromuscular re-education;Patient/family education;Manual techniques;Passive range of motion;Dry  needling;ADLs/Self Care Home Management;Cryotherapy;Electrical Stimulation;Ultrasound;Moist Heat;Iontophoresis 27m/ml Dexamethasone    PT Next Visit Plan  continue following protocol, focus on scapular awareness, control, and strength. Progress band exercises. progress resistance as tolerated    PT Home  Exercise Plan  medbridge: [email or web address]    Consulted and Agree with Plan of Care  Patient       Patient will benefit from skilled therapeutic intervention in order to improve the following deficits and impairments:  Pain, Increased muscle spasms, Decreased strength, Decreased endurance, Decreased range of motion, Decreased activity tolerance, Impaired sensation, Increased fascial restricitons, Decreased coordination, Decreased mobility, Postural dysfunction  Visit Diagnosis: Chronic left shoulder pain  Stiffness of left shoulder, not elsewhere classified  Muscle weakness (generalized)     Problem List Patient Active Problem List   Diagnosis Date Noted  . 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 Colon cancer screening   . Bilateral carpal tunnel syndrome 03/17/2017  . 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 Lumbar radiculitis 08/14/2014  . DDD (degenerative disc disease), lumbar 08/14/2014  . Sleep apnea 08/07/2014  . Obesity, unspecified 08/07/2014  . History of hypothyroidism 08/07/2014  . Chronic kidney disease 08/07/2014  . Arthritis 08/07/2014    Nancy Nordmann, PT, DPT 09/25/2018, 2:21 PM  [place] [facility] [address], [place], [address] Phone: [phone]   Fax:  [phone]  Name: Kelsey Woods MRN: [id number] Date of Birth: April 24, 1951

## 2018-09-27 ENCOUNTER — Ambulatory Visit: Payer: Medicare Other | Admitting: Physical Therapy

## 2018-09-27 ENCOUNTER — Encounter: Payer: Self-pay | Admitting: Physical Therapy

## 2018-09-27 ENCOUNTER — Encounter: Payer: Medicare Other | Admitting: Physical Therapy

## 2018-09-27 DIAGNOSIS — M25512 Pain in left shoulder: Secondary | ICD-10-CM | POA: Diagnosis not present

## 2018-09-27 DIAGNOSIS — G8929 Other chronic pain: Secondary | ICD-10-CM

## 2018-09-27 DIAGNOSIS — M6281 Muscle weakness (generalized): Secondary | ICD-10-CM

## 2018-09-27 DIAGNOSIS — M25612 Stiffness of left shoulder, not elsewhere classified: Secondary | ICD-10-CM

## 2018-09-27 NOTE — Therapy (Signed)
[facility] [address], [place], [address] Phone: [phone]   Fax:  [phone]  Physical Therapy Treatment  Patient Details  Name: Kelsey Woods MRN: [id number] Date of Birth: August 11, 1951 Referring Provider (PT): Poggi MD   Encounter Date: 09/27/2018  PT End of Session - 09/27/18 1122    Visit Number  23    Number of Visits  32    Date for PT Re-Evaluation  10/23/18    Authorization Type  [employer] Medicare    Authorization Time Period  Cert period 69/62/9528 through 10/23/2018    Authorization - Visit Number  8    Authorization - Number of Visits  10    PT Start Time  4132    PT Stop Time  4401    PT Time Calculation (min)  48 min    Activity Tolerance  Patient tolerated treatment well;Patient limited by pain;Patient limited by fatigue    Behavior During Therapy  [facility] for tasks assessed/performed       Past Medical History:  Diagnosis Date  . Acute appendicitis with perforation and peritoneal abscess   . Anxiety   . Arthritis   . Complication of anesthesia    swelling of lips after gallblader surgery  . GERD (gastroesophageal reflux disease)   . Hypothyroidism    h/o no meds now  . Ruptured appendicitis 11/28/2017  . Sleep apnea     Past Surgical History:  Procedure Laterality Date  . APPENDECTOMY    . BREAST BIOPSY Right 1980's   Benign  . CESAREAN SECTION    . CHOLECYSTECTOMY    . COLONOSCOPY WITH PROPOFOL N/A 12/29/2017   Procedure: COLONOSCOPY WITH PROPOFOL;  Surgeon: Lin Landsman, MD;  Location: [facility] ENDOSCOPY;  Service: Gastroenterology;  Laterality: N/A;  . DILATION AND CURETTAGE OF UTERUS    . JOINT REPLACEMENT Bilateral    knees  . JOINT REPLACEMENT Right    hip  . LAPAROSCOPIC APPENDECTOMY N/A 01/24/2018   Procedure: APPENDECTOMY LAPAROSCOPIC;  Surgeon: Olean Ree, MD;  Location: [facility] ORS;  Service: General;  Laterality: N/A;  . NASAL SEPTUM SURGERY    .  REPLACEMENT TOTAL KNEE BILATERAL    . REVERSE SHOULDER ARTHROPLASTY Left 06/15/2018   Procedure: TOTAL .REVERSE SHOULDER ARTHROPLASTY;  Surgeon: Corky Mull, MD;  Location: [facility] ORS;  Service: Orthopedics;  Laterality: Left;  . TONSILLECTOMY    . TOTAL HIP ARTHROPLASTY      There were no vitals filed for this visit.  Subjective Assessment - 09/27/18 1120    Subjective  Patient reports she continues to feel better from her upper respiratory illness. She is feeling well today with 1/10 pain in the left shoulder under the axiall. She was sore after last visit and took a tramadol to sleep the night after her last treatment. She was able to continue her HEP as prescribed.     Pertinent History  B Knee replacement, appendix excision, R hip Replacement, Reverse total shoulder on the L,     Limitations  Lifting;House hold activities;Other (comment)    Diagnostic tests  MRI, Xray    Patient Stated Goals  Improve shoulder function and decrease pain     Currently in Pain?  Yes    Pain Score  1     Pain Location  Shoulder    Pain Orientation  Left;Lower    Pain Type  Surgical pain    Pain Onset  More than a month ago  Pain Frequency  Intermittent        PT Education - 09/27/18 1214    Education Details  activity cuing, rationale for interventions    Person(s) Educated  Patient    Methods  Explanation;Demonstration;Tactile cues    Comprehension  Verbalized understanding;Returned demonstration         OBJECTIVE: L Shoulder PROM in supine (post-manual):  - Flexion: 145 - Abduction: 130 - ER (at 30 degrees scaption): 64 -   TREATMENT: Not sensitive to latex. Surgery date: 06/15/2018  ~15weeks from surgery.  Therapeutic exercise:to centralize symptoms and improve ROM and strength required for successful completion of functional activities.  -Upper body ergometerbase leveland68mnutes forwards/backwards to encourage joint nutrition, warm tissue, induce analgesic effect of  aerobic exercise, improve muscular strength and endurance, and prepare for remainder of session.Subjective discussed during this time.  - reclined L shoulder flexion with cuing for shoulder down and upward rotation of scapula to end range, 2x10 with 2#, x 10 with 1#  - sidelying, reclined Lshoulder ER with cuing for shoulder down and upward rotation of scapula to end range,2x10 with 2#, and x10 with 1# -sidelying left shoulder abduction on reclined surface, with cuing for scapular depression and upward rotation, 2# dumbbell, 2x10, x 10 with 1#. - reclined shoulder IR against blue theraband, 3x10  - reclined snow angels 2x8 with cuing   Manual therapy: toimprove scapular strength, improve range of motion, neuromodulation, in order to promote improved ability to complete functional activities. - PROM with gentle OP, L shoulder flexion, abduction, and ER, 5 xec holds x 20 each direction.  IR/Ext x 10 each direction.   HOME EXERCISE PROGRAM Access Code: [email or web address] URL: [email or web address]  Date: 09/04/2018  Prepared by: SRosita Kea  Program Notes  SHOULDER AWAY FROM YOUR EAR!  Reduce sets to 2 if it is too sore.    Exercises   Supine Shoulder Flexion with Dowel - 3 sets - 10-15 reps - 5 second hold - 1x daily   Supine Shoulder Press with Dowel - 10 reps - 3 sets - 1x daily - 7x weekly   Supine Shoulder Protraction with Dowel - 10 reps - 3 sets - 1x daily - 7x weekly   Sidelying Shoulder Abduction - 10 reps - 1 sets - 3x daily - 7x weekly   Standing Bilateral Low Shoulder Row with Anchored Resistance - 10 reps - 3 sets - 1x daily - 7x weekly   Shoulder extension with resistance - Neutral - 10 reps - 3 sets - 1x daily - 7x weekly   Sidelying Shoulder Flexion 15 Degrees - 2 sets - 10 reps - 1 second hold - 1x daily   Supine Shoulder Horizontal Abduction with Resistance - 10-15 reps - 1 second hold - 2 Sets - 1x daily - 3x weekly   Patient response to  treatment: Pt tolerated treatment well. Pt was able to complete all exercises withmild increase in soreness. She continued to be less limited by pain today but had minimal ability to progress exericses.She demonstrated improved isolation and activation of scapular musculature and was able tocontinueprogressingshoulder elevation exercises.Ptcont todemo reducedbut improvingscapular awareness, strength, and control that is affecting her glenohumeral rhythmbut this continues to improvewith multimodal cuing. Pt reported fatigue with most exercises. Pt continues toprogress towards goals.  PT Long Term Goals - 08/30/18 1408      PT LONG TERM GOAL #1   Title  Patient will be independent with HEP to continue benefits of therapy after discharge.  Baseline  Continues to complete HEP independently as appropriate for overall progress but has not yet reached final HEP for disharge.     Time  8    Period  Weeks    Status  Partially Met    Target Date  10/23/18      PT LONG TERM GOAL #2   Title  Patient will be able to achieve 120 deg of shoulder flexion AROM without any increase in pain to better be able to reach a high shelf.    Baseline  Unable to perform AROM ; 80deg; 80 degrees with shoulder hike and discomfort (08/28/2018)    Time  8    Period  Weeks    Status  On-going    Target Date  10/23/18      PT LONG TERM GOAL #3   Title  Patient will have a worst pain of 1/10 to better be able to perform House hold activities without high level of pain    Baseline  10/10 : 3/10, still progressing to return to PLOF; 3/10, still progression return to PLOF (08/28/2018/-)    Time  8    Period  Weeks    Status  On-going    Target Date  10/23/18            Plan - 09/27/18 1218    Clinical Impression Statement  Pt tolerated treatment well. Pt was able to complete all exercises withmild increase in soreness. She continued to be less limited by pain today but had minimal ability to progress  exericses.She demonstrated improved isolation and activation of scapular musculature and was able tocontinueprogressingshoulder elevation exercises.Ptcont todemo reducedbut improvingscapular awareness, strength, and control that is affecting her glenohumeral rhythmbut this continues to improvewith multimodal cuing. Pt reported fatigue with most exercises. Pt continues toprogress towards goals.    Rehab Potential  Good    Clinical Impairments Affecting Rehab Potential  (+) highly motivated (-) chronicity of pain    PT Frequency  2x / week    PT Duration  8 weeks    PT Treatment/Interventions  Therapeutic exercise;Therapeutic activities;Neuromuscular re-education;Patient/family education;Manual techniques;Passive range of motion;Dry needling;ADLs/Self Care Home Management;Cryotherapy;Electrical Stimulation;Ultrasound;Moist Heat;Iontophoresis 63m/ml Dexamethasone    PT Next Visit Plan  continue following protocol, focus on scapular awareness, control, and strength. Progress band exercises. progress resistance as tolerated    PT Home Exercise Plan  medbridge: [email or web address]    Consulted and Agree with Plan of Care  Patient       Patient will benefit from skilled therapeutic intervention in order to improve the following deficits and impairments:  Pain, Increased muscle spasms, Decreased strength, Decreased endurance, Decreased range of motion, Decreased activity tolerance, Impaired sensation, Increased fascial restricitons, Decreased coordination, Decreased mobility, Postural dysfunction  Visit Diagnosis: Chronic left shoulder pain  Stiffness of left shoulder, not elsewhere classified  Muscle weakness (generalized)     Problem List Patient Active Problem List   Diagnosis Date Noted  . 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 Colon cancer screening   . Bilateral carpal tunnel syndrome 03/17/2017  . 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 Lumbar  radiculitis 08/14/2014  . DDD (degenerative disc disease), lumbar 08/14/2014  . Sleep apnea 08/07/2014  . Obesity, unspecified 08/07/2014  . History of hypothyroidism 08/07/2014  . Chronic kidney disease 08/07/2014  . Arthritis 08/07/2014    SNancy Nordmann PT, DPT 09/27/2018, 12:18 PM  [facility] [address]  [place], [place], [address] Phone: [phone]   Fax:  [phone]  Name: AREYA LEMMERMAN MRN: [id number] Date of Birth: [birth year]

## 2018-10-02 ENCOUNTER — Ambulatory Visit: Payer: Medicare Other | Admitting: Physical Therapy

## 2018-10-02 ENCOUNTER — Encounter: Payer: Self-pay | Admitting: Physical Therapy

## 2018-10-02 DIAGNOSIS — M25512 Pain in left shoulder: Principal | ICD-10-CM

## 2018-10-02 DIAGNOSIS — G8929 Other chronic pain: Secondary | ICD-10-CM

## 2018-10-02 DIAGNOSIS — M25612 Stiffness of left shoulder, not elsewhere classified: Secondary | ICD-10-CM

## 2018-10-02 DIAGNOSIS — M6281 Muscle weakness (generalized): Secondary | ICD-10-CM

## 2018-10-02 NOTE — Therapy (Signed)
[facility] [address] [place], [address] Phone: [phone]   Fax:  [phone]  Physical Therapy Treatment  Patient Details  Name: Kelsey Woods MRN: [id number] Date of Birth: [birth year] Referring Provider (PT): Poggi MD   Encounter Date: 10/02/2018  PT End of Session - 10/02/18 1302    Visit Number  24    Number of Visits  32    Date for PT Re-Evaluation  10/23/18    Authorization Type  [employer] Medicare    Authorization Time Period  Cert period 80/99/8338 through 10/23/2018    Authorization - Visit Number  9    Authorization - Number of Visits  10    PT Start Time  2505    PT Stop Time  1345    PT Time Calculation (min)  43 min    Activity Tolerance  Patient tolerated treatment well;Patient limited by pain;Patient limited by fatigue    Behavior During Therapy  [facility] for tasks assessed/performed       Past Medical History:  Diagnosis Date  . Acute appendicitis with perforation and peritoneal abscess   . Anxiety   . Arthritis   . Complication of anesthesia    swelling of lips after gallblader surgery  . GERD (gastroesophageal reflux disease)   . Hypothyroidism    h/o no meds now  . Ruptured appendicitis 11/28/2017  . Sleep apnea     Past Surgical History:  Procedure Laterality Date  . APPENDECTOMY    . BREAST BIOPSY Right 1980's   Benign  . CESAREAN SECTION    . CHOLECYSTECTOMY    . COLONOSCOPY WITH PROPOFOL N/A 12/29/2017   Procedure: COLONOSCOPY WITH PROPOFOL;  Surgeon: Lin Landsman, MD;  Location: [facility] ENDOSCOPY;  Service: Gastroenterology;  Laterality: N/A;  . DILATION AND CURETTAGE OF UTERUS    . JOINT REPLACEMENT Bilateral    knees  . JOINT REPLACEMENT Right    hip  . LAPAROSCOPIC APPENDECTOMY N/A 01/24/2018   Procedure: APPENDECTOMY LAPAROSCOPIC;  Surgeon: Olean Ree, MD;  Location: [facility] ORS;  Service: General;  Laterality: N/A;  . NASAL SEPTUM SURGERY    .  REPLACEMENT TOTAL KNEE BILATERAL    . REVERSE SHOULDER ARTHROPLASTY Left 06/15/2018   Procedure: TOTAL .REVERSE SHOULDER ARTHROPLASTY;  Surgeon: Corky Mull, MD;  Location: [facility] ORS;  Service: Orthopedics;  Laterality: Left;  . TONSILLECTOMY    . TOTAL HIP ARTHROPLASTY      There were no vitals filed for this visit.  Subjective Assessment - 10/02/18 1305    Subjective  Patient reports her low back and neck is hurting more than usual today (3-4/10) but reports no pain radiating to the upper or lower extremities. It hurt the most when she first got  up but it has eased off since taking mobic. She did some shopping Saturday and Sunday as well as normal housework. She is unsure what has made her back hurt more. Her shoulder hurt more than usual this weekend and thinks it may be related to some sleeping modifications she made to try to relive back pain. Her shoulder is now back to its normal ache. She hurt after her last treatment session resulting in taking tramadol. She states it was sore the day following last treatment but not hurting like it was directily after.     Pertinent History  B Knee replacement, appendix excision, R hip Replacement, Reverse total shoulder on the L,     Limitations  Lifting;House hold activities;Other (comment)    Diagnostic tests  MRI, Xray    Patient Stated Goals  Improve shoulder function and decrease pain     Pain Score  1     Pain Location  Shoulder    Pain Orientation  Left    Pain Descriptors / Indicators  Sore    Pain Type  Surgical pain    Pain Onset  More than a month ago        PT Education - 10/02/18 1301    Education Details  activity cuing, rationale for interventions    Person(s) Educated  Patient    Methods  Explanation;Demonstration;Tactile cues;Verbal cues    Comprehension  Verbalized understanding;Returned demonstration       OBJECTIVE: L Shoulder PROM in supine (post-manual): -Flexion: 150 painful -Abduction: 132 -ER (at 45 degrees  scaption): 75 - IR (at 80 degrees abduction): 62  - Extension: 52  TREATMENT: Not sensitive to latex. Surgery date: 06/15/2018 ~15.5weeks from surgery.  Therapeutic exercise:to centralize symptoms and improve ROM and strength required for successful completion of functional activities.  -Upper body ergometerbase leveland11mnutes forwards/backwards to encourage joint nutrition, warm tissue, induce analgesic effect of aerobic exercise, improve muscular strength and endurance, and prepare for remainder of session.Subjective discussed during this time.  - Supine bilateral shoulder flexion with PVC stick and 4# weight wrapped around it, 5 second hold at end range to stretch into flexion, 2x 287m with time between for restes.  - reclined (~60 degrees from flat) L shoulder flexion with cuing for shoulder down and upward rotation of scapula to end range, 1x10 with 2#, x 10 with 1#, x10 with no weight. - reclined shoulder IR againstblue theraband, 3x10  - reclined snow angels 2x8 with cuing  Manual therapy: toimprove scapular strength, improve range of motion, neuromodulation, in order to promote improved ability to complete functional activities. - PROM with gentle OP, L shoulder flexion, abduction, ER, IR, and extension, 5 xec holds x 15 each direction.     HOME EXERCISE PROGRAM Access Code: [email or web address]: [email or web address]  Date: 09/04/2018  Prepared by: SaRosita Kea Program Notes  SHOULDER AWAY FROM YOUR EAR!  Reduce sets to 2 if it is too sore.    Exercises   Supine Shoulder Flexion with Dowel - 3 sets - 10-15 reps - 5 second hold - 1x daily   Supine Shoulder Press with Dowel - 10 reps - 3 sets - 1x daily - 7x weekly   Supine Shoulder Protraction with Dowel - 10 reps - 3 sets - 1x daily - 7x weekly   Sidelying Shoulder Abduction - 10 reps - 1 sets - 3x daily - 7x weekly   Standing Bilateral Low Shoulder Row with Anchored Resistance - 10 reps  - 3 sets - 1x daily - 7x weekly   Shoulder extension with resistance - Neutral - 10 reps - 3 sets - 1x daily - 7x weekly   Sidelying Shoulder Flexion 15 Degrees - 2 sets - 10 reps - 1 second hold - 1x daily   Supine Shoulder Horizontal Abduction with Resistance - 10-15 reps - 1 second hold - 2 Sets - 1x daily - 3x weekly   Patient response to treatment: Pt tolerated treatment well. Pt was able to complete all exercises withmild increase in soreness.She was more tender to manual stretches today so the number and intensity was backed off. She was able to advance to a more upright posture during flexion exercises but continues  to fatigue quickly.Ptcont todemo reducedbut improvingscapular awareness, strength, and control that is affecting her glenohumeral rhythmbut this continues to improvewith multimodal cuing. Pt reported fatigue with most exercises. Ptcontinues toprogress towards goals.     PT Long Term Goals - 08/30/18 1408      PT LONG TERM GOAL #1   Title  Patient will be independent with HEP to continue benefits of therapy after discharge.    Baseline  Continues to complete HEP independently as appropriate for overall progress but has not yet reached final HEP for disharge.     Time  8    Period  Weeks    Status  Partially Met    Target Date  10/23/18      PT LONG TERM GOAL #2   Title  Patient will be able to achieve 120 deg of shoulder flexion AROM without any increase in pain to better be able to reach a high shelf.    Baseline  Unable to perform AROM ; 80deg; 80 degrees with shoulder hike and discomfort (08/28/2018)    Time  8    Period  Weeks    Status  On-going    Target Date  10/23/18      PT LONG TERM GOAL #3   Title  Patient will have a worst pain of 1/10 to better be able to perform House hold activities without high level of pain    Baseline  10/10 : 3/10, still progressing to return to PLOF; 3/10, still progression return to PLOF (08/28/2018/-)    Time   8    Period  Weeks    Status  On-going    Target Date  10/23/18            Plan - 10/02/18 1601    Clinical Impression Statement  Pt tolerated treatment well. Pt was able to complete all exercises withmild increase in soreness.She was more tender to manual stretches today so the number and intensity was backed off. She was able to advance to a more upright posture during flexion exercises but continues to fatigue quickly.Ptcont todemo reducedbut improvingscapular awareness, strength, and control that is affecting her glenohumeral rhythmbut this continues to improvewith multimodal cuing. Pt reported fatigue with most exercises. Ptcontinues toprogress towards goals.    Rehab Potential  Good    Clinical Impairments Affecting Rehab Potential  (+) highly motivated (-) chronicity of pain    PT Frequency  2x / week    PT Duration  8 weeks    PT Treatment/Interventions  Therapeutic exercise;Therapeutic activities;Neuromuscular re-education;Patient/family education;Manual techniques;Passive range of motion;Dry needling;ADLs/Self Care Home Management;Cryotherapy;Electrical Stimulation;Ultrasound;Moist Heat;Iontophoresis 30m/ml Dexamethasone    PT Next Visit Plan  continue following protocol, focus on scapular awareness, control, and strength. Progress band exercises. progress resistance as tolerated    PT Home Exercise Plan  medbridge: [email or web address]    Consulted and Agree with Plan of Care  Patient       Patient will benefit from skilled therapeutic intervention in order to improve the following deficits and impairments:  Pain, Increased muscle spasms, Decreased strength, Decreased endurance, Decreased range of motion, Decreased activity tolerance, Impaired sensation, Increased fascial restricitons, Decreased coordination, Decreased mobility, Postural dysfunction  Visit Diagnosis: Chronic left shoulder pain  Stiffness of left shoulder, not elsewhere classified  Muscle weakness  (generalized)     Problem List Patient Active Problem List   Diagnosis Date Noted  . 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 Colon cancer screening   . Bilateral  carpal tunnel syndrome 03/17/2017  . 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 Lumbar radiculitis 08/14/2014  . DDD (degenerative disc disease), lumbar 08/14/2014  . Sleep apnea 08/07/2014  . Obesity, unspecified 08/07/2014  . History of hypothyroidism 08/07/2014  . Chronic kidney disease 08/07/2014  . Arthritis 08/07/2014    Nancy Nordmann, PT, DPT 10/02/2018, 4:02 PM  [facility] [address] [place], [address] Phone: [phone]   Fax:  [phone]  Name: Kelsey Woods MRN: [id number] Date of Birth: 19-Dec-1950

## 2018-10-04 ENCOUNTER — Ambulatory Visit: Payer: Medicare Other | Admitting: Physical Therapy

## 2018-10-04 ENCOUNTER — Encounter: Payer: Medicare Other | Admitting: Physical Therapy

## 2018-10-04 ENCOUNTER — Encounter: Payer: Self-pay | Admitting: Physical Therapy

## 2018-10-04 DIAGNOSIS — G8929 Other chronic pain: Secondary | ICD-10-CM

## 2018-10-04 DIAGNOSIS — M25512 Pain in left shoulder: Principal | ICD-10-CM

## 2018-10-04 DIAGNOSIS — M6281 Muscle weakness (generalized): Secondary | ICD-10-CM

## 2018-10-04 DIAGNOSIS — M25612 Stiffness of left shoulder, not elsewhere classified: Secondary | ICD-10-CM

## 2018-10-04 NOTE — Therapy (Signed)
[place] [facility] [address] [place], [address] Phone: [phone]   Fax:  [phone]  Physical Therapy Treatment/Progress Note Reporting period: 08/28/2018 - 10/04/2018  Patient Details  Name: Kelsey Woods MRN: [id number] Date of Birth: Dec 19, 1950 Referring Provider (PT): Poggi MD   Encounter Date: 10/04/2018  PT End of Session - 10/04/18 1305    Visit Number  25    Number of Visits  32    Date for PT Re-Evaluation  10/23/18    Authorization Type  [employer] Medicare    Authorization Time Period  Cert period 38/08/1750 through 10/23/2018 Last PN: 10/04/2018 (visit 25)    Authorization - Visit Number  1    Authorization - Number of Visits  10    PT Start Time  1300    PT Stop Time  1345    PT Time Calculation (min)  45 min    Activity Tolerance  Patient tolerated treatment well;Patient limited by pain;Patient limited by fatigue    Behavior During Therapy  [facility] for tasks assessed/performed       Past Medical History:  Diagnosis Date  . Acute appendicitis with perforation and peritoneal abscess   . Anxiety   . Arthritis   . Complication of anesthesia    swelling of lips after gallblader surgery  . GERD (gastroesophageal reflux disease)   . Hypothyroidism    h/o no meds now  . Ruptured appendicitis 11/28/2017  . Sleep apnea     Past Surgical History:  Procedure Laterality Date  . APPENDECTOMY    . BREAST BIOPSY Right 1980's   Benign  . CESAREAN SECTION    . CHOLECYSTECTOMY    . COLONOSCOPY WITH PROPOFOL N/A 12/29/2017   Procedure: COLONOSCOPY WITH PROPOFOL;  Surgeon: Lin Landsman, MD;  Location: [facility] ENDOSCOPY;  Service: Gastroenterology;  Laterality: N/A;  . DILATION AND CURETTAGE OF UTERUS    . JOINT REPLACEMENT Bilateral    knees  . JOINT REPLACEMENT Right    hip  . LAPAROSCOPIC APPENDECTOMY N/A 01/24/2018   Procedure: APPENDECTOMY LAPAROSCOPIC;  Surgeon: Olean Ree, MD;   Location: [facility] ORS;  Service: General;  Laterality: N/A;  . NASAL SEPTUM SURGERY    . REPLACEMENT TOTAL KNEE BILATERAL    . REVERSE SHOULDER ARTHROPLASTY Left 06/15/2018   Procedure: TOTAL .REVERSE SHOULDER ARTHROPLASTY;  Surgeon: Corky Mull, MD;  Location: [facility] ORS;  Service: Orthopedics;  Laterality: Left;  . TONSILLECTOMY    . TOTAL HIP ARTHROPLASTY      There were no vitals filed for this visit.  Subjective Assessment - 10/04/18 1300    Subjective  Patient reports she has been having increased left shoulder pain since last treatment session. She reports most pain in axilla and anterior shoulder and it has decreased her ability to complete her usual HEP and she has reduced the band resistance.  She has mostly been doing some morning stretching and cutting back on the amount of it. It seems to be that whatever stretching she has been doing is hurting.  Patient reports overall improvemeint in her function in response to physical therapy. She states she can now wash her hair with both hands and lift her arm further than she has been able to in years. She reports a history of left hand tingling intermittantly that actually lead to her shoulder being diagnosed with the need for a shoulder replacement.     Pertinent History  B Knee replacement, appendix excision, R  hip Replacement, Reverse total shoulder on the L,     Limitations  Lifting;House hold activities;Other (comment)    Diagnostic tests  MRI, Xray    Patient Stated Goals  Improve shoulder function and decrease pain     Currently in Pain?  Yes    Pain Score  2     Pain Location  Shoulder    Pain Orientation  Left;Anterior;Lower    Pain Descriptors / Indicators  Constant;Burning;Stabbing;Other (Comment)   pulling   Pain Onset  More than a month ago    Pain Frequency  Constant       OBJECTIVE:  [facility] PT Assessment - 10/04/18 0001      Cognition   Overall Cognitive Status  Within Functional Limits for tasks assessed       Observation/Other Assessments   Observations  see note from 10/04/2018 for latest  objective measurements      AROM   Left Shoulder Flexion  123 Degrees   compensatory lean/shoulder hike   Left Shoulder ABduction  97 Degrees   compensatory shoulder hike/lean   Left Shoulder Internal Rotation  --   L4 with assistance from other arm   Left Shoulder External Rotation  35 Degrees   shoulder at side     PROM   Left Shoulder Flexion  135 Degrees   painful   Left Shoulder ABduction  120 Degrees   ERP   Left Shoulder Internal Rotation  70 Degrees   35/70 composite   Left Shoulder External Rotation  35 Degrees   ERP, shoulder at 90 degrees abduction      OBSERVATION/INSPECTION: Patient presents with obesity, forward head, rounded shoulders, inability to fully extend hips/lumbar spine in standing causing forward lean.  NEUROLOGICAL: Dermatomes: BUE dermatomes equal and intact to light touch except left lateral shoulder (consistent with surgery).  Myotomes: WFL  Reflexes:  - Biceps brachii reflex (C5, C6): R = 2+, L = 2+. - Brachioradialis reflex (C6): R = 2+, L = 2+. Upper Motor Neuron Screen: Hoffman's, and Clonus (ankle) negative bilaterally.   SPINE MOTION Cervical Spine AROM:  *Indicates pain - Flexion: = 50%  - Extension: = 50%. - Rotation: R= 50%, L = 50% - Side Flexion: R= 50%, L = 50% -  STRENGTH (mid range isometric):  *Indicates pain Shoulder  - Flexion: R = 4/5*, L = 4/5.  - Abduction: R = 4/5*, L = 4/5. - External rotation: R = 3/5*, L = 4/5. - Internal rotation: R = 3+/5, L = 4/5. Elbow - Flexion: R = 4+/5, L = 5/5. - Extension: R = 4+/5, L = 5/5. Grip strength WFL  REPEATED MOTIONS TESTING: - Repeated cervical retraction during = no effect; after = right hand tingling  - Repeated cervical flexion during = no effect; after = no effect - Repated left cervical side bending during = increasing discomfort at left base of skull; after = no effect.    PALPATION: - TTP at anterior shoulder.      PT Education - 10/04/18 1303    Education Details  activity cuing, symptom management, rationale for exercises     Person(s) Educated  Patient    Methods  Explanation;Demonstration;Tactile cues;Verbal cues    Comprehension  Verbalized understanding;Returned demonstration       TREATMENT: Not sensitive to latex. Surgery date: 06/15/2018 ~16weeks from surgery.  Therapeutic exercise:to centralize symptoms and improve ROM and strength required for successful completion of functional activities.  -Upper body ergometerbase leveland10mnutesforwards/backwards to encourage joint  nutrition, warm tissue, induce analgesic effect of aerobic exercise, improve muscular strength and endurance, and prepare for remainder of session.Subjective discussed during this time.  - sidelying shoulder abduction to 90 degrees and scapular protraction with alphabet towards the ceiling.  - Education on diagnosis, prognosis, POC, anatomy and physiology of current condition.  - discussion of modification of HEP for relative rest until next treatment .  Manual therapy: toimprove scapular strength, improve range of motion, neuromodulation, in order to promote improved ability to complete functional activities. - PROM with gentle OP, L shoulder flexion, abduction, ER, and IR, 5 xec holds x 5 each direction.  - PNF D2 and D1 pattern manually resisted L scapular motion in sidelying. X 15 each side   HOME EXERCISE PROGRAM Access Code: [email or web address]  URL: [email or web address]  Date: 09/04/2018  Prepared by: Rosita Kea   Program Notes  SHOULDER AWAY FROM YOUR EAR! Reduce sets to 2 if it is too sore.   Exercises   Supine Shoulder Flexion with Dowel - 3 sets - 10-15 reps - 5 second hold - 1x daily   Supine Shoulder Press with Dowel - 10 reps - 3 sets - 1x daily - 7x weekly   Supine Shoulder Protraction with Dowel - 10 reps - 3 sets - 1x  daily - 7x weekly   Sidelying Shoulder Abduction - 10 reps - 1 sets - 3x daily - 7x weekly   Standing Bilateral Low Shoulder Row with Anchored Resistance - 10 reps - 3 sets - 1x daily - 7x weekly   Shoulder extension with resistance - Neutral - 10 reps - 3 sets - 1x daily - 7x weekly   Sidelying Shoulder Flexion 15 Degrees - 2 sets - 10 reps - 1 second hold - 1x daily   Supine Shoulder Horizontal Abduction with Resistance - 10-15 reps - 1 second hold - 2 Sets - 1x daily - 3x weekly   Patient response to treatment: Pt tolerated treatment fair. She was having more pain today so exercise intensity was significantly regressed and pt was educated extensively in strategies for relative rest until her next visit. Pt was able to complete all exercises with intermittent pain but no worse overall by end of session.She continued to be tender to manual stretches today with end range pain and reported increased hand tingling with repeated cervical retraction. Tingling gradually subsided during the rest of the visit. Ptcont todemo reducedbut improvingscapular awareness, strength, and control that is affecting her glenohumeral rhythmbut this continues to improvewith multimodal cuing. Pt reported fatigue with most exercises. Ptcontinues toprogress towards goals.  PT Long Term Goals - 10/04/18 1358      PT LONG TERM GOAL #1   Title  Patient will be independent with HEP to continue benefits of therapy after discharge.    Baseline  Continues to complete HEP independently as appropriate for overall progress but has not yet reached final HEP for disharge.     Time  8    Period  Weeks    Status  Partially Met    Target Date  10/23/18      PT LONG TERM GOAL #2   Title  Patient will be able to achieve 120 deg of shoulder flexion AROM without any increase in pain to better be able to reach a high shelf.    Baseline  Unable to perform AROM ; 80deg; 80 degrees with shoulder hike and discomfort  (08/28/2018); 123 with shoulder hike and lean with ERP (10/04/2018).  Time  8    Period  Weeks    Status  Partially Met    Target Date  10/23/18      PT LONG TERM GOAL #3   Title  Patient will have a worst pain of 1/10 to better be able to perform House hold activities without high level of pain    Baseline  10/10 : 3/10, still progressing to return to PLOF; 3/10, still progression return to PLOF (08/28/2018/-); 3/10 (10/04/2018).     Time  8    Period  Weeks    Status  Partially Met    Target Date  10/23/18            Plan - 10/04/18 1410    Clinical Impression Statement  Patient has attended 25 physical therapy treatment sessions and has made steady progress towards her goals. She appears to have some increased pain today due to mild flair up in her symptoms, so she was educated in how to apply relative rest over the holiday until she returns for her next session on Monday. Overall, patient is improving in functional ability. Subjectively, she reports being able to was her hair now and reach higher than before. She would like to be able to lift her grandchildren and put on a jacket more easliy still. Objectively, she has improved in strength, ROM, and glenohumeral rhythm mechanics. She continues to have pain, stiffness, and decreased strength and activity tolerance for using her left shoulder and would benefit from continued physical therapy to address her remaining impairments and functional deficits and to reach stated goals.     Rehab Potential  Good    Clinical Impairments Affecting Rehab Potential  (+) highly motivated (-) chronicity of pain    PT Frequency  2x / week    PT Duration  8 weeks    PT Treatment/Interventions  Therapeutic exercise;Therapeutic activities;Neuromuscular re-education;Patient/family education;Manual techniques;Passive range of motion;Dry needling;ADLs/Self Care Home Management;Cryotherapy;Electrical Stimulation;Ultrasound;Moist Heat;Iontophoresis 61m/ml  Dexamethasone    PT Next Visit Plan  continue following protocol, focus on scapular awareness, control, and strength. Progress band exercises. progress resistance as tolerated    PT Home Exercise Plan  medbridge: [email or web address]    Consulted and Agree with Plan of Care  Patient       Patient will benefit from skilled therapeutic intervention in order to improve the following deficits and impairments:  Pain, Increased muscle spasms, Decreased strength, Decreased endurance, Decreased range of motion, Decreased activity tolerance, Impaired sensation, Increased fascial restricitons, Decreased coordination, Decreased mobility, Postural dysfunction  Visit Diagnosis: Chronic left shoulder pain  Stiffness of left shoulder, not elsewhere classified  Muscle weakness (generalized)     Problem List Patient Active Problem List   Diagnosis Date Noted  . 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 Colon cancer screening   . Bilateral carpal tunnel syndrome 03/17/2017  . 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 Lumbar radiculitis 08/14/2014  . DDD (degenerative disc disease), lumbar 08/14/2014  . Sleep apnea 08/07/2014  . Obesity, unspecified 08/07/2014  . History of hypothyroidism 08/07/2014  . Chronic kidney disease 08/07/2014  . Arthritis 08/07/2014    SNancy Nordmann PT, DPT 10/04/2018, 2:11 PM  [facility] [address] [place] [address]Phone: [phone]  Fax:  [phone] Name: Kelsey ACHEEMRN: [id number] of Birth: [birth year]

## 2018-10-10 ENCOUNTER — Ambulatory Visit: Payer: Medicare Other | Attending: Surgery | Admitting: Physical Therapy

## 2018-10-10 ENCOUNTER — Encounter: Payer: Self-pay | Admitting: Physical Therapy

## 2018-10-10 DIAGNOSIS — M25612 Stiffness of left shoulder, not elsewhere classified: Secondary | ICD-10-CM

## 2018-10-10 DIAGNOSIS — M25512 Pain in left shoulder: Secondary | ICD-10-CM | POA: Insufficient documentation

## 2018-10-10 DIAGNOSIS — M6281 Muscle weakness (generalized): Secondary | ICD-10-CM

## 2018-10-10 DIAGNOSIS — G8929 Other chronic pain: Secondary | ICD-10-CM

## 2018-10-10 NOTE — Therapy (Signed)
[place] [facility] [address], [place], [address] Phone: [phone]   Fax:  [phone]  Physical Therapy Treatment  Patient Details  Name: Kelsey Woods MRN: [id number] Date of Birth: 1951/09/03 Referring Provider (PT): Poggi MD   Encounter Date: 10/10/2018  PT End of Session - 10/10/18 1306    Visit Number  26    Number of Visits  32    Date for PT Re-Evaluation  10/23/18    Authorization Type  [employer] Medicare    Authorization Time Period  Cert period 94/70/9628 through 10/23/2018 Last PN: 10/04/2018 (visit 25)    Authorization - Visit Number  2    Authorization - Number of Visits  10    PT Start Time  1300    PT Stop Time  1345    PT Time Calculation (min)  45 min    Activity Tolerance  Patient tolerated treatment well;Patient limited by pain;Patient limited by fatigue    Behavior During Therapy  [facility] for tasks assessed/performed       Past Medical History:  Diagnosis Date  . Acute appendicitis with perforation and peritoneal abscess   . Anxiety   . Arthritis   . Complication of anesthesia    swelling of lips after gallblader surgery  . GERD (gastroesophageal reflux disease)   . Hypothyroidism    h/o no meds now  . Ruptured appendicitis 11/28/2017  . Sleep apnea     Past Surgical History:  Procedure Laterality Date  . APPENDECTOMY    . BREAST BIOPSY Right 1980's   Benign  . CESAREAN SECTION    . CHOLECYSTECTOMY    . COLONOSCOPY WITH PROPOFOL N/A 12/29/2017   Procedure: COLONOSCOPY WITH PROPOFOL;  Surgeon: Lin Landsman, MD;  Location: [facility] ENDOSCOPY;  Service: Gastroenterology;  Laterality: N/A;  . DILATION AND CURETTAGE OF UTERUS    . JOINT REPLACEMENT Bilateral    knees  . JOINT REPLACEMENT Right    hip  . LAPAROSCOPIC APPENDECTOMY N/A 01/24/2018   Procedure: APPENDECTOMY LAPAROSCOPIC;  Surgeon: Olean Ree, MD;  Location: [facility] ORS;  Service: General;  Laterality: N/A;  . NASAL  SEPTUM SURGERY    . REPLACEMENT TOTAL KNEE BILATERAL    . REVERSE SHOULDER ARTHROPLASTY Left 06/15/2018   Procedure: TOTAL .REVERSE SHOULDER ARTHROPLASTY;  Surgeon: Corky Mull, MD;  Location: [facility] ORS;  Service: Orthopedics;  Laterality: Left;  . TONSILLECTOMY    . TOTAL HIP ARTHROPLASTY      There were no vitals filed for this visit.  Subjective Assessment - 10/10/18 1301    Subjective  Patient reports she is continuing to have the same pain in the anterior shoulder but it is not as bad, 1/10 at the anterior shoulder. She reports her neck hurt more than anything after last visit and it was very uncomfortable especially over the right side.  The neck is not bothering her today. The last time she took tramadol was on Friday (for both neck and shoulder pain, neck more than shoulder).  She has not done many of her HEP since last session (as planned).     Pertinent History  B Knee replacement, appendix excision, R hip Replacement, Reverse total shoulder on the L,     Limitations  Lifting;House hold activities;Other (comment)    Diagnostic tests  MRI, Xray    Patient Stated Goals  Improve shoulder function and decrease pain     Currently in Pain?  Yes  Pain Score  1     Pain Location  Shoulder    Pain Orientation  Left;Anterior    Pain Descriptors / Indicators  Aching;Burning    Pain Type  Surgical pain    Pain Onset  More than a month ago    Pain Frequency  Constant         PT Education - 10/10/18 1306    Education Details  activity cuing, symptom management, rationale for exercises    Person(s) Educated  Patient    Methods  Explanation;Demonstration;Tactile cues    Comprehension  Verbalized understanding;Returned demonstration        TREATMENT: Not sensitive to latex. Surgery date: 06/15/2018 > 16weeks from surgery.  Therapeutic exercise:to centralize symptoms and improve ROM and strength required for successful completion of functional activities.  -Upper body  ergometerno added resistance forwards/backwards to encourage joint nutrition, warm tissue, induce analgesic effect of aerobic exercise, improve muscular strength and endurance, and prepare for remainder of session.Subjective discussed during this time. X 5 min.  - supine bilateral shoulder flexion AAROM with PVC stick and 2# ankle weight on it. X 20 to stretch and strengthen shoulder.  - supine chest press with plus with PVC stick and 10# ankle weight x 8 before stopping due to discomfort.  - Seated bicep curls x10 at 3#, 2x10 at 4# each side. To improve ability to lift items at home.  - leaning hands on wall side to side taps with yellow theraloop around wrists x 10 - leaning on hands on plinth, weight shifting side to side with shoulder taps x 10.  - leaning on hands on plinth forward reaches alternating sides x 10.  - standing rows with blue then black theraband, 2x30 - standing shoulder extension with yellow theraband attached overehead x 30.  - standing left shoulder ER with yellow theraband, bolster under arm, and heavy cuing to prevent compensations. x20 - standing left shoulder IR with green theraband and bolster under arm. X 30 - seated bilateral shoulder ER against red theraband with heavy cuing for copensations.    Manual therapy: toimprove scapular strength, improve range of motion, neuromodulation, in order to promote improved ability to complete functional activities. - STM to left anterior shoulder at tender area to decrease tension and provide pain relief.    HOME EXERCISE PROGRAM Access Code: [email or web address]  URL: [email or web address]  Date: 09/04/2018  Prepared by: Rosita Kea   Program Notes  SHOULDER AWAY FROM YOUR EAR! Reduce sets to 2 if it is too sore.   Exercises   Supine Shoulder Flexion with Dowel - 3 sets - 10-15 reps - 5 second hold - 1x daily   Supine Shoulder Press with Dowel - 10 reps - 3 sets - 1x daily - 7x weekly   Supine Shoulder  Protraction with Dowel - 10 reps - 3 sets - 1x daily - 7x weekly   Sidelying Shoulder Abduction - 10 reps - 1 sets - 3x daily - 7x weekly   Standing Bilateral Low Shoulder Row with Anchored Resistance - 10 reps - 3 sets - 1x daily - 7x weekly   Shoulder extension with resistance - Neutral - 10 reps - 3 sets - 1x daily - 7x weekly   Sidelying Shoulder Flexion 15 Degrees - 2 sets - 10 reps - 1 second hold - 1x daily   Supine Shoulder Horizontal Abduction with Resistance - 10-15 reps - 1 second hold - 2 Sets - 1x daily - 3x weekly   Patient  response to treatment: Pt tolerated treatment well. She continues to have pain at anterior left shoulder and focus of exercises was shifted to align more closely with patient goals. She was able to lift up to 4# comfortably with bicep curls. Pt was able to complete all exercises with intermittent anterior shoulder but no worse overall by end of session.She was tender to palpation over anterior shoulder structures.Ptcont todemo reducedbut improvingscapular awareness, strength, and control that is affecting her glenohumeral rhythmbut this continues to improvewith multimodal cuing. Pt reported fatigue with most exercises. Ptcontinues toprogress towards goals.  PT Long Term Goals - 10/04/18 1358      PT LONG TERM GOAL #1   Title  Patient will be independent with HEP to continue benefits of therapy after discharge.    Baseline  Continues to complete HEP independently as appropriate for overall progress but has not yet reached final HEP for disharge.     Time  8    Period  Weeks    Status  Partially Met    Target Date  10/23/18      PT LONG TERM GOAL #2   Title  Patient will be able to achieve 120 deg of shoulder flexion AROM without any increase in pain to better be able to reach a high shelf.    Baseline  Unable to perform AROM ; 80deg; 80 degrees with shoulder hike and discomfort (08/28/2018); 123 with shoulder hike and lean with ERP  (10/04/2018).     Time  8    Period  Weeks    Status  Partially Met    Target Date  10/23/18      PT LONG TERM GOAL #3   Title  Patient will have a worst pain of 1/10 to better be able to perform House hold activities without high level of pain    Baseline  10/10 : 3/10, still progressing to return to PLOF; 3/10, still progression return to PLOF (08/28/2018/-); 3/10 (10/04/2018).     Time  8    Period  Weeks    Status  Partially Met    Target Date  10/23/18            Plan - 10/11/18 9562    Clinical Impression Statement  Pt tolerated treatment well. She continues to have pain at anterior left shoulder and focus of exercises was shifted to align more closely with patient goals. She was able to lift up to 4# comfortably with bicep curls. Pt was able to complete all exercises with intermittent anterior shoulder but no worse overall by end of session.She was tender to palpation over anterior shoulder structures.Ptcont todemo reducedbut improvingscapular awareness, strength, and control that is affecting her glenohumeral rhythmbut this continues to improvewith multimodal cuing. Pt reported fatigue with most exercises. Ptcontinues toprogress towards goals.    Rehab Potential  Good    Clinical Impairments Affecting Rehab Potential  (+) highly motivated (-) chronicity of pain    PT Frequency  2x / week    PT Duration  8 weeks    PT Treatment/Interventions  Therapeutic exercise;Therapeutic activities;Neuromuscular re-education;Patient/family education;Manual techniques;Passive range of motion;Dry needling;ADLs/Self Care Home Management;Cryotherapy;Electrical Stimulation;Ultrasound;Moist Heat;Iontophoresis 23m/ml Dexamethasone    PT Next Visit Plan  continue following protocol, focus on scapular awareness, control, and strength. Progress band exercises. progress resistance as tolerated    PT Home Exercise Plan  medbridge: [email or web address]   Consulted and Agree with Plan of Care  Patient        Patient will  benefit from skilled therapeutic intervention in order to improve the following deficits and impairments:  Pain, Increased muscle spasms, Decreased strength, Decreased endurance, Decreased range of motion, Decreased activity tolerance, Impaired sensation, Increased fascial restricitons, Decreased coordination, Decreased mobility, Postural dysfunction  Visit Diagnosis: Chronic left shoulder pain  Stiffness of left shoulder, not elsewhere classified  Muscle weakness (generalized)     Problem List Patient Active Problem List   Diagnosis Date Noted  . 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 Colon cancer screening   . Bilateral carpal tunnel syndrome 03/17/2017  . 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 Lumbar radiculitis 08/14/2014  . DDD (degenerative disc disease), lumbar 08/14/2014  . Sleep apnea 08/07/2014  . Obesity, unspecified 08/07/2014  . History of hypothyroidism 08/07/2014  . Chronic kidney disease 08/07/2014  . Arthritis 08/07/2014    Nancy Nordmann, PT, DPT 10/11/2018, 7:30 AM  [place] [facility] [address], [place], [address] Phone: [phone]   Fax:  [phone]  Name: Kelsey Woods MRN: [id number] Date of Birth: March 03, 1951

## 2018-10-12 ENCOUNTER — Encounter: Payer: Medicare Other | Admitting: Physical Therapy

## 2018-10-16 ENCOUNTER — Ambulatory Visit: Payer: Medicare Other | Admitting: Physical Therapy

## 2018-10-16 ENCOUNTER — Encounter: Payer: Self-pay | Admitting: Physical Therapy

## 2018-10-16 DIAGNOSIS — M6281 Muscle weakness (generalized): Secondary | ICD-10-CM

## 2018-10-16 DIAGNOSIS — M25512 Pain in left shoulder: Principal | ICD-10-CM

## 2018-10-16 DIAGNOSIS — G8929 Other chronic pain: Secondary | ICD-10-CM

## 2018-10-16 DIAGNOSIS — M25612 Stiffness of left shoulder, not elsewhere classified: Secondary | ICD-10-CM

## 2018-10-16 NOTE — Therapy (Signed)
[facility] [address], [place], [address] Phone: [phone]   Fax:  [phone]  Physical Therapy Treatment  Patient Details  Name: Kelsey Woods MRN: [id number] Date of Birth: 1951/10/28 Referring Provider (PT): Poggi MD   Encounter Date: 10/16/2018  PT End of Session - 10/16/18 1029    Visit Number  27    Number of Visits  32    Date for PT Re-Evaluation  10/23/18    Authorization Type  [employer] Medicare    Authorization Time Period  Cert period 49/44/9675 through 10/23/2018 Last PN: 10/04/2018 (visit 25)    Authorization - Visit Number  2    Authorization - Number of Visits  10    PT Start Time  0945    PT Stop Time  1030    PT Time Calculation (min)  45 min    Activity Tolerance  Patient tolerated treatment well;Patient limited by pain;Patient limited by fatigue    Behavior During Therapy  [facility] for tasks assessed/performed       Past Medical History:  Diagnosis Date  . Acute appendicitis with perforation and peritoneal abscess   . Anxiety   . Arthritis   . Complication of anesthesia    swelling of lips after gallblader surgery  . GERD (gastroesophageal reflux disease)   . Hypothyroidism    h/o no meds now  . Ruptured appendicitis 11/28/2017  . Sleep apnea     Past Surgical History:  Procedure Laterality Date  . APPENDECTOMY    . BREAST BIOPSY Right 1980's   Benign  . CESAREAN SECTION    . CHOLECYSTECTOMY    . COLONOSCOPY WITH PROPOFOL N/A 12/29/2017   Procedure: COLONOSCOPY WITH PROPOFOL;  Surgeon: Lin Landsman, MD;  Location: [facility] ENDOSCOPY;  Service: Gastroenterology;  Laterality: N/A;  . DILATION AND CURETTAGE OF UTERUS    . JOINT REPLACEMENT Bilateral    knees  . JOINT REPLACEMENT Right    hip  . LAPAROSCOPIC APPENDECTOMY N/A 01/24/2018   Procedure: APPENDECTOMY LAPAROSCOPIC;  Surgeon: Olean Ree, MD;  Location: [facility] ORS;  Service: General;  Laterality: N/A;  . NASAL  SEPTUM SURGERY    . REPLACEMENT TOTAL KNEE BILATERAL    . REVERSE SHOULDER ARTHROPLASTY Left 06/15/2018   Procedure: TOTAL .REVERSE SHOULDER ARTHROPLASTY;  Surgeon: Corky Mull, MD;  Location: [facility] ORS;  Service: Orthopedics;  Laterality: Left;  . TONSILLECTOMY    . TOTAL HIP ARTHROPLASTY      There were no vitals filed for this visit.  Subjective Assessment - 10/16/18 0950    Subjective  Patient reports she has been having a lot of back and right sided neck pain that started on Wednesday when she went christmas shopping. It gradually got worse until she started taking some muscle relaxants, including a half dose last night. She noticed her neck is not turning well on Saturday. She states her left shoulder continues to tire quickly and be mildly painfu at the anterior region. Shoulder pain is less than 1/10 it is not hurting right now.  Neck/back (2/10) currently. She continues to have pain after she completes HEP, so she has been doing stretches and some lifting. She couldn't do anything after her back started hurting because "everything seemed connected to her back and neck."     Pertinent History  B Knee replacement, appendix excision, R hip Replacement, Reverse total shoulder on the L,     Limitations  Lifting;House hold activities;Other (comment)  Diagnostic tests  MRI, Xray    Patient Stated Goals  Improve shoulder function and decrease pain     Currently in Pain?  Yes    Pain Score  1     Pain Location  Shoulder    Pain Orientation  Left;Anterior    Pain Descriptors / Indicators  Aching;Burning    Pain Type  Surgical pain    Pain Radiating Towards  also has pain in right side of neck and back today that is more limiting than shoulder.     Pain Onset  More than a month ago          PT Education - 10/16/18 1305    Education Details  cuing for form, rationale for exercises    Person(s) Educated  Patient    Methods  Explanation;Demonstration;Tactile cues;Verbal cues     Comprehension  Verbalized understanding;Returned demonstration        TREATMENT: Not sensitive to latex. Surgery date: 06/15/2018 > 16weeks from surgery.  Therapeutic exercise:to centralize symptoms and improve ROM and strength required for successful completion of functional activities.  -Upper body ergometerno added resistance forwards/backwards to encourage joint nutrition, warm tissue, induce analgesic effect of aerobic exercise, improve muscular strength and endurance, and prepare for remainder of session.Subjective discussed during this time. X 5 min.  - Seated bicep curls 3x10 each side. To improve ability to lift items at home.  Pt reports she cannot do 4# today.  - seated AAROM flexion with PVC pipe 3x10.  - seated chest press with plus x 10 with black theraband, 2x15 with green theraband - seated rows with blue then black theraband, 3x15 - seated shoulder extension with red theraband attached overehead 3x15 - standing left shoulder ER with yellow theraband, bolster under arm, and heavy cuing to prevent compensations. 3x10 - standing left shoulder IR with green theraband and bolster under arm. 3x15   HOME EXERCISE PROGRAM Access Code: [email or web address]  URL: [email or web address]  Date: 09/04/2018  Prepared by: Rosita Kea   Program Notes  SHOULDER AWAY FROM YOUR EAR! Reduce sets to 2 if it is too sore.   Exercises   Supine Shoulder Flexion with Dowel - 3 sets - 10-15 reps - 5 second hold - 1x daily   Supine Shoulder Press with Dowel - 10 reps - 3 sets - 1x daily - 7x weekly   Supine Shoulder Protraction with Dowel - 10 reps - 3 sets - 1x daily - 7x weekly   Sidelying Shoulder Abduction - 10 reps - 1 sets - 3x daily - 7x weekly   Standing Bilateral Low Shoulder Row with Anchored Resistance - 10 reps - 3 sets - 1x daily - 7x weekly   Shoulder extension with resistance - Neutral - 10 reps - 3 sets - 1x daily - 7x weekly   Sidelying Shoulder Flexion  15 Degrees - 2 sets - 10 reps - 1 second hold - 1x daily   Supine Shoulder Horizontal Abduction with Resistance - 10-15 reps - 1 second hold - 2 Sets - 1x daily - 3x weekly   Patient response to treatment: Pt tolerated treatmentwell. She was able to complete all exercises in seated position without increased neck or back pain. She reported increased feeling of "tightness" and fatigue in her shoulder but not significant increase of pain. She did not feel comfortable lifting more than 3# dumbbells for bicep curls today. Exercises were not advanced due to her inability to perform them since last visit because of back  pain. Exercises were modified to seated position to accomodate back/neck pain.Pt was able to complete all exercises with intermittent anterior shoulder but no worse overall by end of session.Ptcont todemo reducedbut improvingscapular awareness, strength, and control that is affecting her glenohumeral rhythmbut this continues to improvewith multimodal cuing. Pt reported fatigue with most exercises. Ptcontinues toprogress towards goals.  PT Long Term Goals - 10/04/18 1358      PT LONG TERM GOAL #1   Title  Patient will be independent with HEP to continue benefits of therapy after discharge.    Baseline  Continues to complete HEP independently as appropriate for overall progress but has not yet reached final HEP for disharge.     Time  8    Period  Weeks    Status  Partially Met    Target Date  10/23/18      PT LONG TERM GOAL #2   Title  Patient will be able to achieve 120 deg of shoulder flexion AROM without any increase in pain to better be able to reach a high shelf.    Baseline  Unable to perform AROM ; 80deg; 80 degrees with shoulder hike and discomfort (08/28/2018); 123 with shoulder hike and lean with ERP (10/04/2018).     Time  8    Period  Weeks    Status  Partially Met    Target Date  10/23/18      PT LONG TERM GOAL #3   Title  Patient will have a worst pain  of 1/10 to better be able to perform House hold activities without high level of pain    Baseline  10/10 : 3/10, still progressing to return to PLOF; 3/10, still progression return to PLOF (08/28/2018/-); 3/10 (10/04/2018).     Time  8    Period  Weeks    Status  Partially Met    Target Date  10/23/18            Plan - 10/16/18 1453    Clinical Impression Statement  Pt tolerated treatmentwell. She was able to complete all exercises in seated position without increased neck or back pain. She reported increased feeling of "tightness" and fatigue in her shoulder but not significant increase of pain. She did not feel comfortable lifting more than 3# dumbbells for bicep curls today. Exercises were not advanced due to her inability to perform them since last visit because of back pain. Exercises were modified to seated position to accomodate back/neck pain.Pt was able to complete all exercises with intermittent anterior shoulder but no worse overall by end of session.Ptcont todemo reducedbut improvingscapular awareness, strength, and control that is affecting her glenohumeral rhythmbut this continues to improvewith multimodal cuing. Pt reported fatigue with most exercises. Ptcontinues toprogress towards goals.    Rehab Potential  Good    Clinical Impairments Affecting Rehab Potential  (+) highly motivated (-) chronicity of pain    PT Frequency  2x / week    PT Duration  8 weeks    PT Treatment/Interventions  Therapeutic exercise;Therapeutic activities;Neuromuscular re-education;Patient/family education;Manual techniques;Passive range of motion;Dry needling;ADLs/Self Care Home Management;Cryotherapy;Electrical Stimulation;Ultrasound;Moist Heat;Iontophoresis 42m/ml Dexamethasone    PT Next Visit Plan  continue following protocol, focus on scapular awareness, control, and strength. Progress band exercises. progress resistance as tolerated. Tentative discharge date 10/23/2018.    PT Home  Exercise Plan  medbridge: [email or web address]   Consulted and Agree with Plan of Care  Patient       Patient will benefit from skilled therapeutic  intervention in order to improve the following deficits and impairments:  Pain, Increased muscle spasms, Decreased strength, Decreased endurance, Decreased range of motion, Decreased activity tolerance, Impaired sensation, Increased fascial restricitons, Decreased coordination, Decreased mobility, Postural dysfunction  Visit Diagnosis: Chronic left shoulder pain  Stiffness of left shoulder, not elsewhere classified  Muscle weakness (generalized)     Problem List Patient Active Problem List   Diagnosis Date Noted  . 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 Colon cancer screening   . Bilateral carpal tunnel syndrome 03/17/2017  . 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 Lumbar radiculitis 08/14/2014  . DDD (degenerative disc disease), lumbar 08/14/2014  . Sleep apnea 08/07/2014  . Obesity, unspecified 08/07/2014  . History of hypothyroidism 08/07/2014  . Chronic kidney disease 08/07/2014  . Arthritis 08/07/2014    Nancy Nordmann, PT, DPT 10/16/2018, 2:53 PM  [facility] [address] [place], [address] Phone: [phone]   Fax:  [phone]  Name: Kelsey Woods MRN: [id number] Date of Birth: [birth year]

## 2018-10-19 ENCOUNTER — Ambulatory Visit: Payer: Medicare Other | Admitting: Physical Therapy

## 2018-10-19 ENCOUNTER — Encounter: Payer: Self-pay | Admitting: Physical Therapy

## 2018-10-19 DIAGNOSIS — M25512 Pain in left shoulder: Secondary | ICD-10-CM | POA: Diagnosis not present

## 2018-10-19 DIAGNOSIS — M6281 Muscle weakness (generalized): Secondary | ICD-10-CM

## 2018-10-19 DIAGNOSIS — M25612 Stiffness of left shoulder, not elsewhere classified: Secondary | ICD-10-CM

## 2018-10-19 DIAGNOSIS — G8929 Other chronic pain: Secondary | ICD-10-CM

## 2018-10-19 NOTE — Therapy (Signed)
[facility] [facility] [address], [place], [address] Phone: [phone]   Fax:  [phone]  Physical Therapy Treatment  Patient Details  Name: Kelsey Woods MRN: [id number] Date of Birth: 1951-05-06 Referring Provider (PT): Poggi MD   Encounter Date: 10/19/2018  PT End of Session - 10/19/18 1345    Visit Number  28    Number of Visits  32    Date for PT Re-Evaluation  10/23/18    Authorization Type  [employer] Medicare    Authorization Time Period  Cert period 69/62/9528 through 10/23/2018 Last PN: 10/04/2018 (visit 25)    Authorization - Visit Number  3    Authorization - Number of Visits  10    PT Start Time  1340    PT Stop Time  1415    PT Time Calculation (min)  35 min    Activity Tolerance  Patient tolerated treatment well;Patient limited by pain;Patient limited by fatigue    Behavior During Therapy  [facility] for tasks assessed/performed       Past Medical History:  Diagnosis Date  . Acute appendicitis with perforation and peritoneal abscess   . Anxiety   . Arthritis   . Complication of anesthesia    swelling of lips after gallblader surgery  . GERD (gastroesophageal reflux disease)   . Hypothyroidism    h/o no meds now  . Ruptured appendicitis 11/28/2017  . Sleep apnea     Past Surgical History:  Procedure Laterality Date  . APPENDECTOMY    . BREAST BIOPSY Right 1980's   Benign  . CESAREAN SECTION    . CHOLECYSTECTOMY    . COLONOSCOPY WITH PROPOFOL N/A 12/29/2017   Procedure: COLONOSCOPY WITH PROPOFOL;  Surgeon: Lin Landsman, MD;  Location: [facility] ENDOSCOPY;  Service: Gastroenterology;  Laterality: N/A;  . DILATION AND CURETTAGE OF UTERUS    . JOINT REPLACEMENT Bilateral    knees  . JOINT REPLACEMENT Right    hip  . LAPAROSCOPIC APPENDECTOMY N/A 01/24/2018   Procedure: APPENDECTOMY LAPAROSCOPIC;  Surgeon: Olean Ree, MD;  Location: [facility] ORS;  Service: General;  Laterality: N/A;  .  NASAL SEPTUM SURGERY    . REPLACEMENT TOTAL KNEE BILATERAL    . REVERSE SHOULDER ARTHROPLASTY Left 06/15/2018   Procedure: TOTAL .REVERSE SHOULDER ARTHROPLASTY;  Surgeon: Corky Mull, MD;  Location: [facility] ORS;  Service: Orthopedics;  Laterality: Left;  . TONSILLECTOMY    . TOTAL HIP ARTHROPLASTY      There were no vitals filed for this visit.  Subjective Assessment - 10/19/18 1341    Subjective  Patient reports that she is continuing to have some difficulty with her back, but her neck is feeling better. She reports she still gets pain in her left shoulder in the mornings (1/10) and sometimes in the evening (1/10). She states it depends on what she is doing during the day. It was higher than 1/10 after cleaning the floors the other day. She is able to pick up the baby but with more weight on the right than the left that bothers her back some. Her pain is still most commonly in the anterior shoulder. She reports no pain upon arrival. States her HEP is going. She does most of them most days. Patient is still in aggreement that next visit will be her last session for this episode of care.     Pertinent History  B Knee replacement, appendix excision, R hip Replacement, Reverse total shoulder on  the L,     Limitations  Lifting;House hold activities;Other (comment)    Diagnostic tests  MRI, Xray    Patient Stated Goals  Improve shoulder function and decrease pain     Currently in Pain?  No/denies    Pain Onset  More than a month ago        TREATMENT: Not sensitive to latex. Surgery date: 06/15/2018 >16weeks from surgery.  Therapeutic exercise:to centralize symptoms and improve ROM and strength required for successful completion of functional activities.  -Upper body ergometerno added resistanceforwards/backwards to encourage joint nutrition, warm tissue, induce analgesic effect of aerobic exercise, improve muscular strength and endurance, and prepare for remainder of session.Subjective  discussed during this time. X 5 min. - Seated bicep curls 3x10 each side at 4#. To improve ability to lift items at home. - seated AAROM flexion with PVC pipe 3x10.  - seated chest press with plus x 10 with blue theraband, 3x10 with   - seated rows with black theraband, 3x15 - seated shoulder extension with red theraband attached overhead 3x15   - standing left shoulder ER with yellow theraband, bolster under arm, and heavy cuing to prevent compensations. 3x10 - standing left shoulder IR with green theraband and bolster under arm. 3x15   HOME EXERCISE PROGRAM Access Code: [email or web address]  URL: [email or web address]  Date: 10/19/2018  Prepared by: Rosita Kea   Program Notes  SHOULDER AWAY FROM YOUR EAR!  Reduce sets to 2 if it is too sore.    Exercises  Sidelying Shoulder ER with Towel and Dumbbell - 10-15 reps - 1 second hold - 3 Sets - 1x daily - 3x weekly  Supine Shoulder Horizontal Abduction with Resistance - 10-15 reps - 1 second hold - 2 Sets - 1x daily - 3x weekly  Standing Shoulder Internal Rotation AAROM with Dowel - 10 reps - 5 second hold - 2 Sets - 1x daily - 3x weekly  Standing Bilateral Low Shoulder Row with Anchored Resistance - 10 reps - 3 sets - 1x daily - 7x weekly  Shoulder extension with resistance - Neutral - 10 reps - 3 sets - 1x daily - 7x weekly  Seated Bicep Curls Supinated with Dumbbells - 10-15 reps - 1 second hold - 3 Sets - 1x daily - 3x weekly  Seated Shoulder Scaption - 10-15 reps - 1 second hold - 3 Sets - 1x daily - 3x weekly  Seated Shoulder Flexion AAROM with Dowel - 10-15 reps - 1 second hold - 3 Sets - 1x daily - 3x weekly  Seated Scapular Protraction with Resistance - 10-15 reps - 1 second hold - 3 Sets - 1x daily - 3x weekly      Patient response to treatment: Pt was late for her appointment and the visit was unable to be extended due to scheduling constraints. Pt tolerated treatmentwell. She was able to complete all  exercises in seated position without increased neck or back pain. She reported increased fatigue in her shoulder but not significant increase of pain. She was able to gently progress some exercises today and was able to perform scaption with no weight and mild shoulder hike in seated position successfully. Her HEP was updated to include more long term exercises to allow pt to attempt them independently before her last scheduled visits so any concerns can be addressed. Exercises were modified to seated position to accomodate back/neck pain.Pt was able to complete all exercises with intermittentanterior shoulderdiscomfort but no worse overall by end of session.Ptcont todemo  reducedbut improvingscapular awareness, strength, and control that is affecting her glenohumeral rhythmbut this continues to improvewith multimodal cuing. Pt reported fatigue with most exercises. Ptcontinues toprogress towards goals.   PT Education - 10/19/18 1344    Education Details  cuing for form, rationale for exercises, review of HEP with updated exericses    Person(s) Educated  Patient    Methods  Explanation;Demonstration;Tactile cues;Verbal cues;Handout    Comprehension  Verbalized understanding;Returned demonstration          PT Long Term Goals - 10/04/18 1358      PT LONG TERM GOAL #1   Title  Patient will be independent with HEP to continue benefits of therapy after discharge.    Baseline  Continues to complete HEP independently as appropriate for overall progress but has not yet reached final HEP for disharge.     Time  8    Period  Weeks    Status  Partially Met    Target Date  10/23/18      PT LONG TERM GOAL #2   Title  Patient will be able to achieve 120 deg of shoulder flexion AROM without any increase in pain to better be able to reach a high shelf.    Baseline  Unable to perform AROM ; 80deg; 80 degrees with shoulder hike and discomfort (08/28/2018); 123 with shoulder hike and lean with ERP  (10/04/2018).     Time  8    Period  Weeks    Status  Partially Met    Target Date  10/23/18      PT LONG TERM GOAL #3   Title  Patient will have a worst pain of 1/10 to better be able to perform House hold activities without high level of pain    Baseline  10/10 : 3/10, still progressing to return to PLOF; 3/10, still progression return to PLOF (08/28/2018/-); 3/10 (10/04/2018).     Time  8    Period  Weeks    Status  Partially Met    Target Date  10/23/18            Plan - 10/19/18 1545    Clinical Impression Statement  Pt was late for her appointment and the visit was unable to be extended due to scheduling constraints. Pt tolerated treatmentwell. She was able to complete all exercises in seated position without increased neck or back pain. She reported increased fatigue in her shoulder but not significant increase of pain. She was able to gently progress some exercises today and was able to perform scaption with no weight and mild shoulder hike in seated position successfully. Her HEP was updated to include more long term exercises to allow pt to attempt them independently before her last scheduled visits so any concerns can be addressed. Exercises were modified to seated position to accomodate back/neck pain.Pt was able to complete all exercises with intermittentanterior shoulderdiscomfort but no worse overall by end of session.Ptcont todemo reducedbut improvingscapular awareness, strength, and control that is affecting her glenohumeral rhythmbut this continues to improvewith multimodal cuing. Pt reported fatigue with most exercises. Ptcontinues toprogress towards goals.    Rehab Potential  Good    Clinical Impairments Affecting Rehab Potential  (+) highly motivated (-) chronicity of pain    PT Frequency  2x / week    PT Duration  8 weeks    PT Treatment/Interventions  Therapeutic exercise;Therapeutic activities;Neuromuscular re-education;Patient/family education;Manual  techniques;Passive range of motion;Dry needling;ADLs/Self Care Home Management;Cryotherapy;Electrical Stimulation;Ultrasound;Moist Heat;Iontophoresis 45m/ml Dexamethasone  PT Next Visit Plan  continue following protocol, focus on scapular awareness, control, and strength. Progress band exercises. progress resistance as tolerated. Tentative discharge date 10/23/2018.    PT Home Exercise Plan  medbridge: [email or web address]    Consulted and Agree with Plan of Care  Patient       Patient will benefit from skilled therapeutic intervention in order to improve the following deficits and impairments:  Pain, Increased muscle spasms, Decreased strength, Decreased endurance, Decreased range of motion, Decreased activity tolerance, Impaired sensation, Increased fascial restricitons, Decreased coordination, Decreased mobility, Postural dysfunction  Visit Diagnosis: Chronic left shoulder pain  Stiffness of left shoulder, not elsewhere classified  Muscle weakness (generalized)     Problem List Patient Active Problem List   Diagnosis Date Noted  . 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 Colon cancer screening   . Bilateral carpal tunnel syndrome 03/17/2017  . 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 Lumbar radiculitis 08/14/2014  . DDD (degenerative disc disease), lumbar 08/14/2014  . Sleep apnea 08/07/2014  . Obesity, unspecified 08/07/2014  . History of hypothyroidism 08/07/2014  . Chronic kidney disease 08/07/2014  . Arthritis 08/07/2014    Nancy Nordmann, PT, DPT 10/19/2018, 3:46 PM  [place] [facility] [address] [place], [address] Phone: [phone]   Fax:  [phone]  Name: Kelsey Woods MRN: [id number] Date of Birth: 1951-04-24

## 2018-10-23 ENCOUNTER — Ambulatory Visit: Payer: Medicare Other | Admitting: Physical Therapy

## 2018-10-23 ENCOUNTER — Encounter: Payer: Self-pay | Admitting: Physical Therapy

## 2018-10-23 DIAGNOSIS — M6281 Muscle weakness (generalized): Secondary | ICD-10-CM

## 2018-10-23 DIAGNOSIS — M25512 Pain in left shoulder: Principal | ICD-10-CM

## 2018-10-23 DIAGNOSIS — G8929 Other chronic pain: Secondary | ICD-10-CM

## 2018-10-23 DIAGNOSIS — M25612 Stiffness of left shoulder, not elsewhere classified: Secondary | ICD-10-CM

## 2018-10-23 NOTE — Therapy (Signed)
[facility] [address] [place], [address] Phone: [phone]   Fax:  [phone]  Physical Therapy Treatment and Discharge Summary Reporting Period: 07/03/2018 - 10/23/2018  Patient Details  Name: Kelsey Woods MRN: [id number] Date of Birth: [birth year] Referring Provider (PT): Poggi MD   Encounter Date: 10/23/2018  PT End of Session - 10/23/18 1156    Visit Number  29    Number of Visits  32    Date for PT Re-Evaluation  10/23/18    Authorization Type  [employer] Medicare    Authorization Time Period  Cert period 08/14/1218 through 10/23/2018 Last PN: 10/04/2018 (visit 25)    Authorization - Visit Number  4    Authorization - Number of Visits  10    PT Start Time  1115    PT Stop Time  1150    PT Time Calculation (min)  35 min    Activity Tolerance  Patient tolerated treatment well;Patient limited by pain;Patient limited by fatigue    Behavior During Therapy  [facility] for tasks assessed/performed       Past Medical History:  Diagnosis Date  . Acute appendicitis with perforation and peritoneal abscess   . Anxiety   . Arthritis   . Complication of anesthesia    swelling of lips after gallblader surgery  . GERD (gastroesophageal reflux disease)   . Hypothyroidism    h/o no meds now  . Ruptured appendicitis 11/28/2017  . Sleep apnea     Past Surgical History:  Procedure Laterality Date  . APPENDECTOMY    . BREAST BIOPSY Right 1980's   Benign  . CESAREAN SECTION    . CHOLECYSTECTOMY    . COLONOSCOPY WITH PROPOFOL N/A 12/29/2017   Procedure: COLONOSCOPY WITH PROPOFOL;  Surgeon: Lin Landsman, MD;  Location: [facility] ENDOSCOPY;  Service: Gastroenterology;  Laterality: N/A;  . DILATION AND CURETTAGE OF UTERUS    . JOINT REPLACEMENT Bilateral    knees  . JOINT REPLACEMENT Right    hip  . LAPAROSCOPIC APPENDECTOMY N/A 01/24/2018   Procedure: APPENDECTOMY LAPAROSCOPIC;  Surgeon: Olean Ree, MD;   Location: [facility] ORS;  Service: General;  Laterality: N/A;  . NASAL SEPTUM SURGERY    . REPLACEMENT TOTAL KNEE BILATERAL    . REVERSE SHOULDER ARTHROPLASTY Left 06/15/2018   Procedure: TOTAL .REVERSE SHOULDER ARTHROPLASTY;  Surgeon: Corky Mull, MD;  Location: [facility] ORS;  Service: Orthopedics;  Laterality: Left;  . TONSILLECTOMY    . TOTAL HIP ARTHROPLASTY      There were no vitals filed for this visit.  Subjective Assessment - 10/23/18 1117    Subjective  Patient reports she must leave early today due to a mix up with picking up grandchildren. She states she hurt her left arm last night when she reached back to hand her grandchild a frosty. She knew she had over-stressed it when she reached back and it has hurt more last night and is sore this morning. She states it is her familiar pain, just a bit more intense. Nothing new. She states by this time of day it is usually more worked out by now.  She rates her pain no more than 1/10 but wanted to make sure physical therapist knew it wasn't feeling as good as usual and that that had happened. She did enjoy the christmast production she went to last night.  She feels like she has overdone things in general this weekend and is still waking funny  due to her back pain. [place] and walked quite a bit, doing a whole lot more than she usually does. She tried her HEP and successful except when she tried to use the black theraband, which was too difficult. She had no excessive pain or soreness after her last visit. She still feels comfortable with today being her last day this episode of care.     Pertinent History  B Knee replacement, appendix excision, R hip Replacement, Reverse total shoulder on the L,     Limitations  Lifting;House hold activities;Other (comment)    Diagnostic tests  MRI, Xray    Patient Stated Goals  Improve shoulder function and decrease pain     Currently in Pain?  Yes    Pain Score  1      Pain Location  Shoulder    Pain Orientation  Left;Anterior    Pain Descriptors / Indicators  Aching;Burning    Pain Type  Surgical pain    Pain Onset  More than a month ago         [facility] PT Assessment - 10/23/18 0001      Cognition   Overall Cognitive Status  Within Functional Limits for tasks assessed      Observation/Other Assessments   Observations  see note from 10/23/2018 for latest objective data      AROM   Left Shoulder Extension  50 Degrees    Left Shoulder Flexion  125 Degrees   compensatory shoulder hike lean   Left Shoulder ABduction  90 Degrees   compensatory shoulder hike, lean   Left Shoulder Internal Rotation  --   L4 with assistance from other arm   Left Shoulder External Rotation  27 Degrees   elbow at side     PROM   Left Shoulder Flexion  135 Degrees   painful   Left Shoulder ABduction  120 Degrees   ERP   Left Shoulder Internal Rotation  70 Degrees   35/70 composite   Left Shoulder External Rotation  35 Degrees   ERP, shoulder at 90 degrees abduction     OBSERVATION/INSPECTION: Patient presents with obesity, forward head, rounded shoulders, inability to fully extend hips/lumbar spine in standing causing forward lean.  STRENGTH (mid range isometric):  *Indicates pain Shoulder            Flexion: R = 4+/5, L = 5/5.   Abduction: R = 4+/5, L = 5/5.   External rotation: R = 4/5, L = 3+/5*.  Internal rotation: R = 4+/5, L = 4/5. Elbow  Flexion: R = 5/5, L = 4+/5.  Extension: R = 5/5, L = 4+/5. Grip strength WFL  PALPATION: TTP at anterior shoulder.     PT Education - 10/23/18 1155    Education Details  cuing for form, provided long term HEP, self-management for the future, discharge instructions    Person(s) Educated  Patient    Methods  Explanation;Demonstration;Tactile cues;Verbal cues;Handout    Comprehension  Verbalized understanding;Returned demonstration       TREATMENT: Not sensitive to latex. Surgery date:  06/15/2018 >16weeks from surgery.  Therapeutic exercise:to centralize symptoms and improve ROM and strength required for successful completion of functional activities.  -Upper body ergometerno added resistanceforwards/backwards to encourage joint nutrition, warm tissue, induce analgesic effect of aerobic exercise, improve muscular strength and endurance, and prepare for remainder of session.Subjective discussed during this time. X 5 min. - Seated bicep curlsx10 each side  at 4#. To improve ability to lift items at home. - seated AAROM flexion with PVC pipe x10. -seatedchest press with plus x 10 with blue theraband -seatedrows with black theraband, x15 -seatedshoulder extension withredtheraband attached overhead x15 - AAROM L shoulder extension with OP with cane, 5 second hold, x 10, seated - AAROM L shoulder ER with OP cane x 10, 5 second hold, seated -Education on diagnosis, prognosis, POC, anatomy and physiology of current condition.  -Education on HEP including handout  - measurements to assess progress and readines for discharge (see above).   HOME EXERCISE PROGRAM Access Code: [email or web address]  URL: [email or web address]  Date: 10/23/2018  Prepared by: Rosita Kea   Program Notes  SHOULDER AWAY FROM YOUR EAR!  Reduce sets to 2 if it is too sore.   Exercises  Sidelying Shoulder ER with Towel and Dumbbell - 10-15 reps - 1 second hold - 3 Sets - 1x daily - 3x weekly  Supine Shoulder Horizontal Abduction with Resistance - 10-15 reps - 1 second hold - 2 Sets - 1x daily - 3x weekly  Standing Shoulder Internal Rotation AAROM with Dowel - 10 reps - 5 second hold - 2 Sets - 1x daily - 3x weekly  Standing Shoulder Extension AAROM with Dowel - 10 reps - 5 second hold - 3 Sets - 1x daily - 3x weekly  Seated Shoulder External Rotation AAROM with Dowel - 10 reps - 5 second hold - 3 Sets - 1x daily - 3x weekly  Standing Bilateral Low Shoulder Row with Anchored  Resistance - 10 reps - 3 sets - 1x daily - 7x weekly  Shoulder extension with resistance - Neutral - 10 reps - 3 sets - 1x daily - 7x weekly  Seated Bicep Curls Supinated with Dumbbells - 10-15 reps - 1 second hold - 3 Sets - 1x daily - 3x weekly  Seated Shoulder Scaption - 10-15 reps - 1 second hold - 3 Sets - 1x daily - 3x weekly  Seated Shoulder Flexion AAROM with Dowel - 10-15 reps - 1 second hold - 3 Sets - 1x daily - 3x weekly  Seated Scapular Protraction with Resistance - 10-15 reps - 1 second hold - 3 Sets - 1x daily - 3x weekly    Patient response to treatment: Pt requested to leave early today due to needing to pick up children from daycare and a mixup in who was going to pick them up. Pt tolerated treatmentwell. Shewas able to complete all exercises in seated position without increased neck or back pain. She reported increased fatigue and more soreness than usual in the left shoulder but not significant increase of pain. Her HEP was reviewed updated to include more long term exercises. Exercises were modified to seated position to accomodate back/neck pain.Pt was able to complete all exercises with intermittentanterior shoulderdiscomfort but no worse overall by end of session.Ptcont todemo reducedbut improvingscapular awareness, strength, and control that is affecting her glenohumeral rhythmbut this continues to improvewith multimodal cuing.   PT Long Term Goals - 10/23/18 1137      PT LONG TERM GOAL #1   Title  Patient will be independent with HEP to continue benefits of therapy after discharge.    Baseline  Successfully performing long term HEP     Time  8    Period  Weeks    Status  Achieved    Target Date  10/23/18      PT LONG TERM GOAL #2   Title  Patient will  be able to achieve 120 deg of shoulder flexion AROM without any increase in pain to better be able to reach a high shelf.    Baseline  See objective exam    Time  8    Period  Weeks    Status  Partially  Met    Target Date  10/23/18      PT LONG TERM GOAL #3   Title  Patient will have a worst pain of 1/10 to better be able to perform House hold activities without high level of pain    Baseline  10/10 : 3/10, still progressing to return to PLOF; 3/10, still progression return to PLOF (08/28/2018/-); 3/10 (10/04/2018); 3/10 at discharge    Time  8    Period  Weeks    Status  Partially Met    Target Date  10/23/18            Plan - 10/23/18 1202    Clinical Impression Statement  Patient has attended 29 physical therapy treatment sessions since her surgery. She made steady progress towards her goals but at this time she has plateaued in her progress and is satisfied with her gains. She has met or mostly met all of her goals and stated she is comfortable discontinuing care at this time. She continues to report low level of pain and and soreness in the left shoulder and has difficulty with elevation above 90 degrees due to weakness and discomfort. She is independent at this point in a long term HEP. Patient has made dramatic improvements since initial evaluation.     Rehab Potential  Good    Clinical Impairments Affecting Rehab Potential  (+) highly motivated (-) chronicity of pain    PT Frequency  2x / week    PT Duration  8 weeks    PT Treatment/Interventions  Therapeutic exercise;Therapeutic activities;Neuromuscular re-education;Patient/family education;Manual techniques;Passive range of motion;Dry needling;ADLs/Self Care Home Management;Cryotherapy;Electrical Stimulation;Ultrasound;Moist Heat;Iontophoresis 18m/ml Dexamethasone    PT Next Visit Plan  Patient is now discharged from skilled physical therapy.     PT Home Exercise Plan  medbridge: [email or web address]   Consulted and Agree with Plan of Care  Patient       Patient will benefit from skilled therapeutic intervention in order to improve the following deficits and impairments:  Pain, Increased muscle spasms, Decreased strength, Decreased  endurance, Decreased range of motion, Decreased activity tolerance, Impaired sensation, Increased fascial restricitons, Decreased coordination, Decreased mobility, Postural dysfunction  Visit Diagnosis: Chronic left shoulder pain  Stiffness of left shoulder, not elsewhere classified  Muscle weakness (generalized)     Problem List Patient Active Problem List   Diagnosis Date Noted  . 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 Colon cancer screening   . Bilateral carpal tunnel syndrome 03/17/2017  . 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 Lumbar radiculitis 08/14/2014  . DDD (degenerative disc disease), lumbar 08/14/2014  . Sleep apnea 08/07/2014  . Obesity, unspecified 08/07/2014  . History of hypothyroidism 08/07/2014  . Chronic kidney disease 08/07/2014  . Arthritis 08/07/2014    SNancy Woods PT, DPT 10/23/2018, 12:03 PM  [facility] [address] [place] [address]Phone: [phone]  Fax:  [phone] Name: Kelsey LUPEMRN: [id number] of Birth: 109-05-1951

## 2018-10-24 ENCOUNTER — Ambulatory Visit: Payer: Medicare Other | Admitting: Physical Therapy

## 2018-10-26 ENCOUNTER — Ambulatory Visit: Payer: Medicare Other | Admitting: Physical Therapy

## 2018-10-30 ENCOUNTER — Encounter: Payer: Medicare Other | Admitting: Physical Therapy

## 2018-11-02 ENCOUNTER — Encounter: Payer: Medicare Other | Admitting: Physical Therapy

## 2018-11-06 ENCOUNTER — Encounter: Payer: Medicare Other | Admitting: Physical Therapy

## 2019-09-10 ENCOUNTER — Other Ambulatory Visit: Payer: Self-pay | Admitting: Internal Medicine

## 2019-09-10 DIAGNOSIS — Z1231 Encounter for screening mammogram for malignant neoplasm of breast: Secondary | ICD-10-CM

## 2019-09-11 ENCOUNTER — Ambulatory Visit
Admission: RE | Admit: 2019-09-11 | Discharge: 2019-09-11 | Disposition: A | Discharge status: Home / Self Care | Payer: Medicare Other | Source: Ambulatory Visit | Attending: Internal Medicine | Admitting: Internal Medicine

## 2019-09-11 DIAGNOSIS — Z1231 Encounter for screening mammogram for malignant neoplasm of breast: Secondary | ICD-10-CM | POA: Diagnosis not present

## 2020-06-12 ENCOUNTER — Other Ambulatory Visit: Payer: Self-pay | Admitting: Rehabilitative and Restorative Service Providers"

## 2020-06-12 DIAGNOSIS — R0602 Shortness of breath: Secondary | ICD-10-CM

## 2020-06-12 DIAGNOSIS — R079 Chest pain, unspecified: Secondary | ICD-10-CM

## 2020-06-12 DIAGNOSIS — R0789 Other chest pain: Secondary | ICD-10-CM

## 2020-06-19 ENCOUNTER — Other Ambulatory Visit: Payer: Self-pay | Admitting: Family Medicine

## 2020-06-19 DIAGNOSIS — M5442 Lumbago with sciatica, left side: Secondary | ICD-10-CM

## 2020-06-23 ENCOUNTER — Other Ambulatory Visit: Payer: Self-pay

## 2020-06-23 ENCOUNTER — Ambulatory Visit
Admission: RE | Admit: 2020-06-23 | Discharge: 2020-06-23 | Disposition: A | Discharge status: Home / Self Care | Payer: Medicare PPO | Source: Ambulatory Visit | Attending: Rehabilitative and Restorative Service Providers" | Admitting: Rehabilitative and Restorative Service Providers"

## 2020-06-23 DIAGNOSIS — R079 Chest pain, unspecified: Secondary | ICD-10-CM | POA: Diagnosis present

## 2020-06-23 MED ORDER — REGADENOSON 0.4 MG/5ML IV SOLN
0.4000 mg | Freq: Once | INTRAVENOUS | Status: AC
  Administered 2020-06-23: 0.4 mg via INTRAVENOUS

## 2020-06-23 MED ORDER — TECHNETIUM TC 99M TETROFOSMIN IV KIT
10.0000 | PACK | Freq: Once | INTRAVENOUS | Status: AC | PRN
  Administered 2020-06-23: 10.8 via INTRAVENOUS

## 2020-06-23 MED ORDER — TECHNETIUM TC 99M TETROFOSMIN IV KIT
30.0000 | PACK | Freq: Once | INTRAVENOUS | Status: AC | PRN
  Administered 2020-06-23: 32.487 via INTRAVENOUS

## 2020-06-26 LAB — NM MYOCAR MULTI W/SPECT W/WALL MOTION / EF
Estimated workload: 1 METS
Exercise duration (min): 1 min
Exercise duration (sec): 3 s
LV dias vol: 80 mL (ref 46–106)
LV sys vol: 22 mL
Peak HR: 93 {beats}/min
Rest HR: 71 {beats}/min
SDS: 12
SRS: 3
SSS: 8
TID: 1.08

## 2020-06-27 ENCOUNTER — Other Ambulatory Visit: Payer: Self-pay

## 2020-06-27 ENCOUNTER — Ambulatory Visit
Admission: RE | Admit: 2020-06-27 | Discharge: 2020-06-27 | Disposition: A | Discharge status: Home / Self Care | Payer: Medicare PPO | Source: Ambulatory Visit | Attending: Rehabilitative and Restorative Service Providers" | Admitting: Rehabilitative and Restorative Service Providers"

## 2020-06-27 DIAGNOSIS — R0789 Other chest pain: Secondary | ICD-10-CM | POA: Diagnosis not present

## 2020-06-27 DIAGNOSIS — R0600 Dyspnea, unspecified: Secondary | ICD-10-CM | POA: Insufficient documentation

## 2020-06-27 DIAGNOSIS — R0602 Shortness of breath: Secondary | ICD-10-CM | POA: Diagnosis not present

## 2020-06-27 DIAGNOSIS — G4730 Sleep apnea, unspecified: Secondary | ICD-10-CM | POA: Insufficient documentation

## 2020-06-27 LAB — ECHOCARDIOGRAM COMPLETE
AR max vel: 1.76 cm2
AV Area VTI: 1.7 cm2
AV Area mean vel: 1.75 cm2
AV Mean grad: 5 mmHg
AV Peak grad: 8.2 mmHg
Ao pk vel: 1.43 m/s
Area-P 1/2: 3.21 cm2
S' Lateral: 3.13 cm

## 2020-06-27 NOTE — Progress Notes (Signed)
*  PRELIMINARY RESULTS* Echocardiogram 2D Echocardiogram has been performed.  Sherrie Sport 06/27/2020, 10:17 AM

## 2020-07-06 ENCOUNTER — Ambulatory Visit
Admission: RE | Admit: 2020-07-06 | Discharge: 2020-07-06 | Disposition: A | Discharge status: Home / Self Care | Payer: Medicare PPO | Source: Ambulatory Visit | Attending: Family Medicine | Admitting: Family Medicine

## 2020-07-06 DIAGNOSIS — M5442 Lumbago with sciatica, left side: Secondary | ICD-10-CM | POA: Diagnosis present

## 2021-03-13 ENCOUNTER — Other Ambulatory Visit: Payer: Self-pay | Admitting: Infectious Diseases

## 2021-03-13 DIAGNOSIS — Z1231 Encounter for screening mammogram for malignant neoplasm of breast: Secondary | ICD-10-CM

## 2022-02-03 ENCOUNTER — Ambulatory Visit
Admission: RE | Admit: 2022-02-03 | Discharge: 2022-02-03 | Disposition: A | Discharge status: Home / Self Care | Payer: Medicare PPO | Source: Ambulatory Visit | Attending: Infectious Diseases | Admitting: Infectious Diseases

## 2022-02-03 ENCOUNTER — Other Ambulatory Visit: Payer: Self-pay

## 2022-02-03 DIAGNOSIS — Z1231 Encounter for screening mammogram for malignant neoplasm of breast: Secondary | ICD-10-CM | POA: Diagnosis not present

## 2022-07-20 IMAGING — MG MM DIGITAL SCREENING BILAT W/ TOMO AND CAD
6 of 10 series · 6 of 30 positions shown · non-contrast
Comparison: Previous exam(s).

ACR Breast Density Category a: The breast tissue is almost entirely
fatty.

CLINICAL DATA: Screening.

EXAM:
DIGITAL SCREENING BILATERAL MAMMOGRAM WITH TOMOSYNTHESIS AND CAD
TECHNIQUE: Bilateral screening digital craniocaudal and mediolateral oblique
mammograms were obtained. Bilateral screening digital breast
tomosynthesis was performed. The images were evaluated with
computer-aided detection.

[R CC synth-2D]
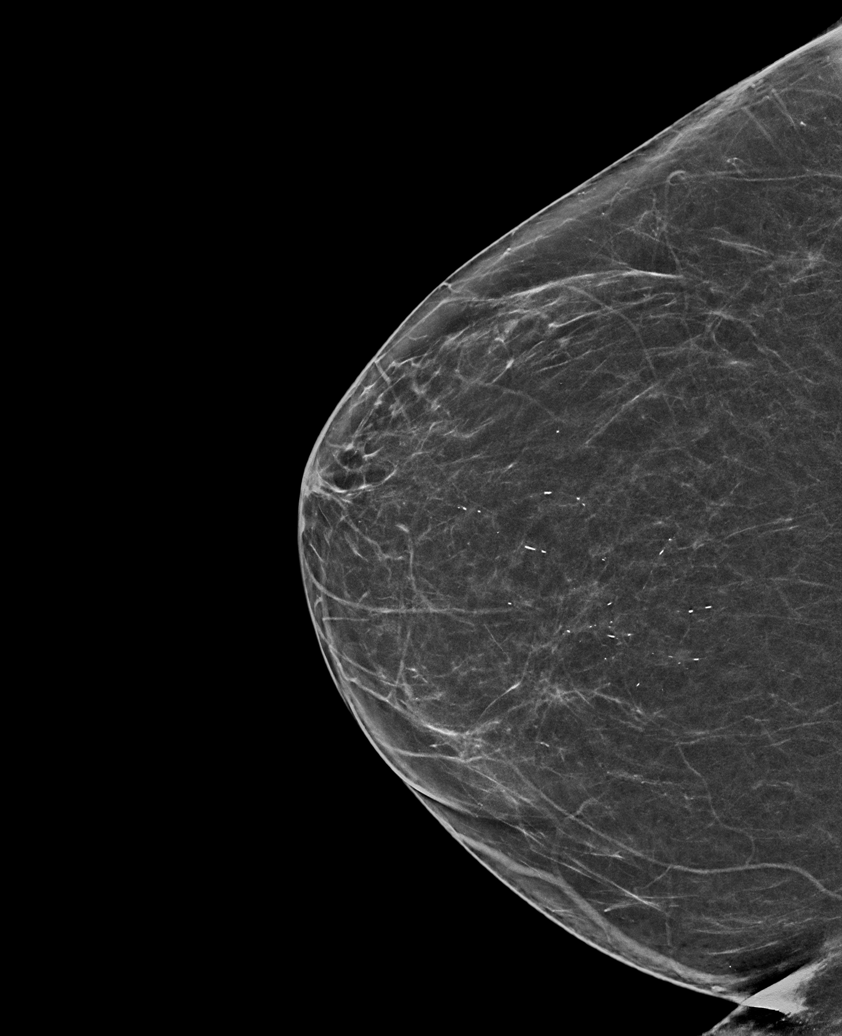

[L CV synth-2D]
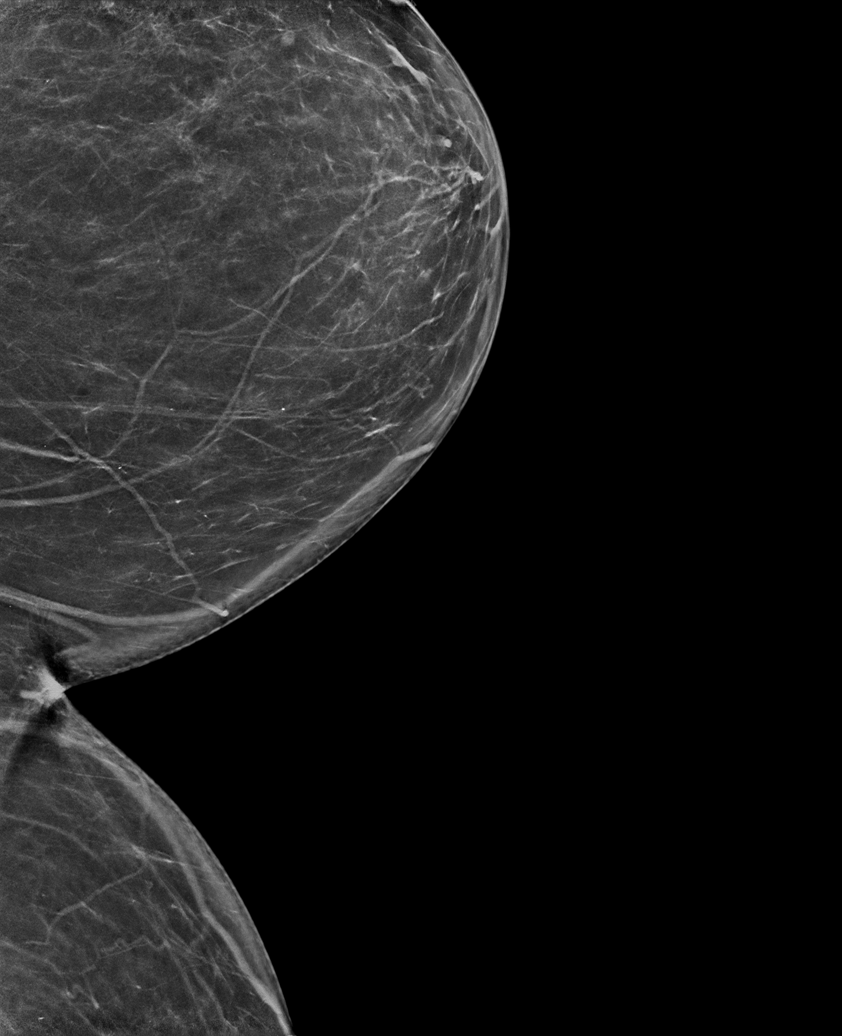

[L MLO synth-2D]
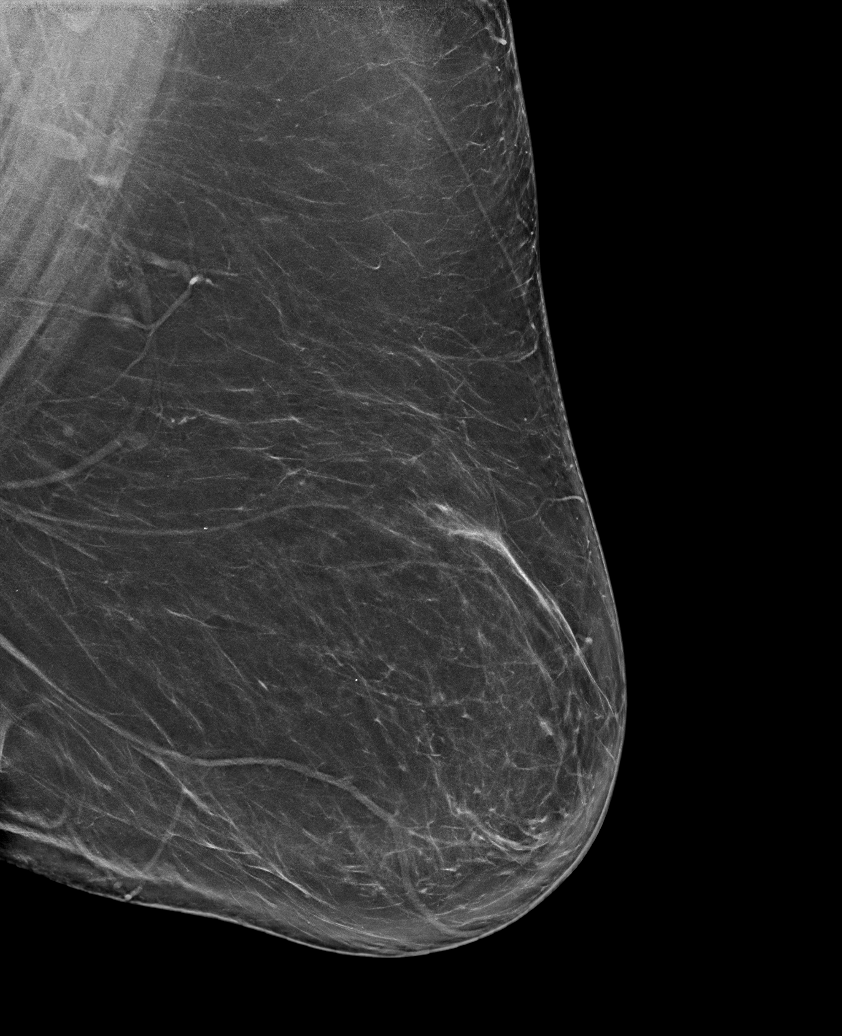

[L CC synth-2D]
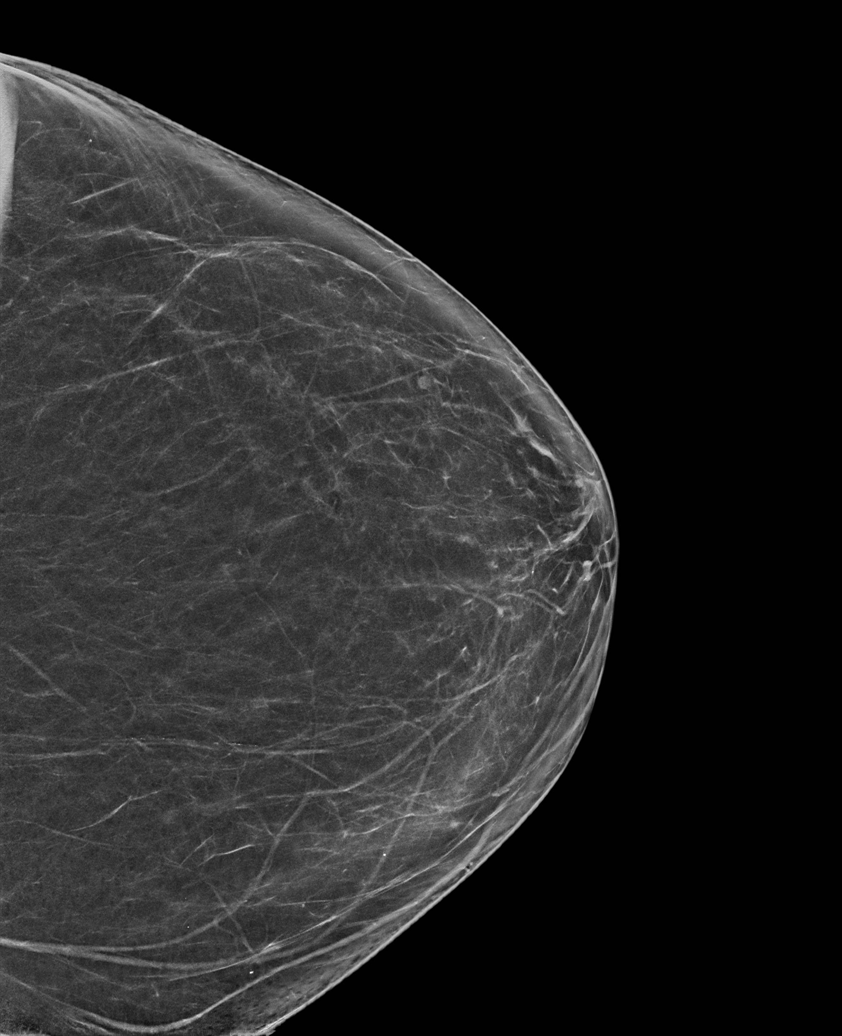

[R MLO synth-2D]
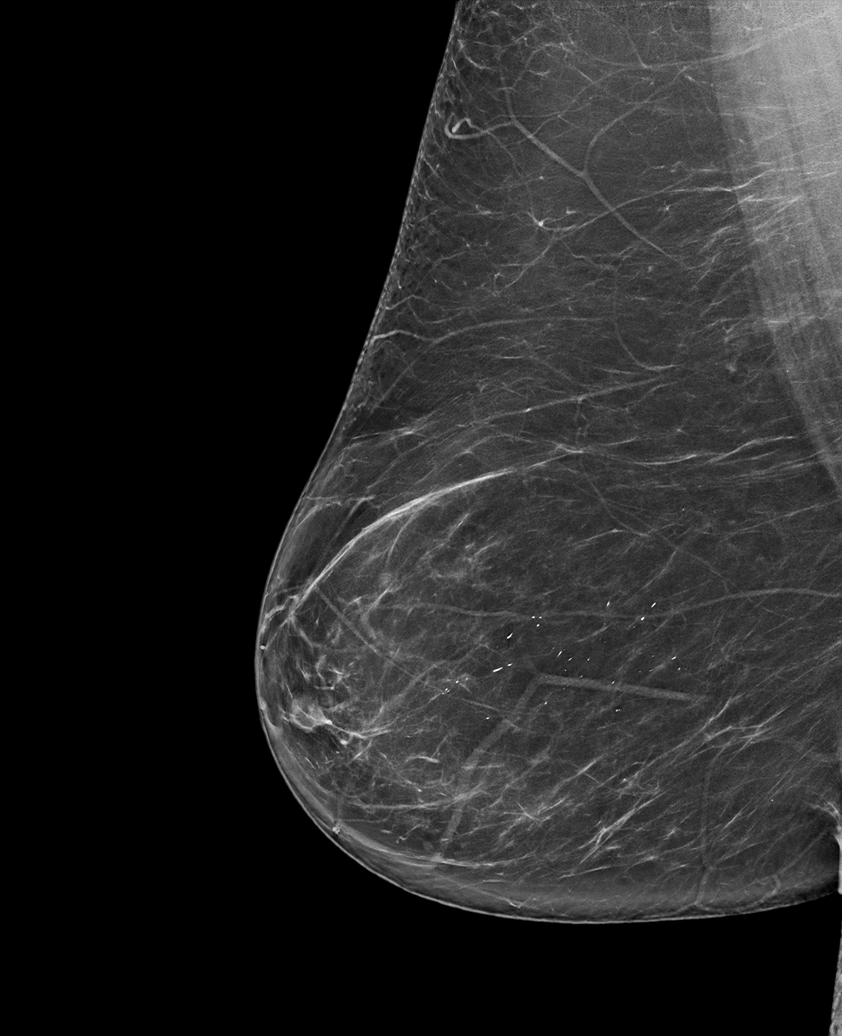

[L CV tomo · tomo slice 41/80.0]
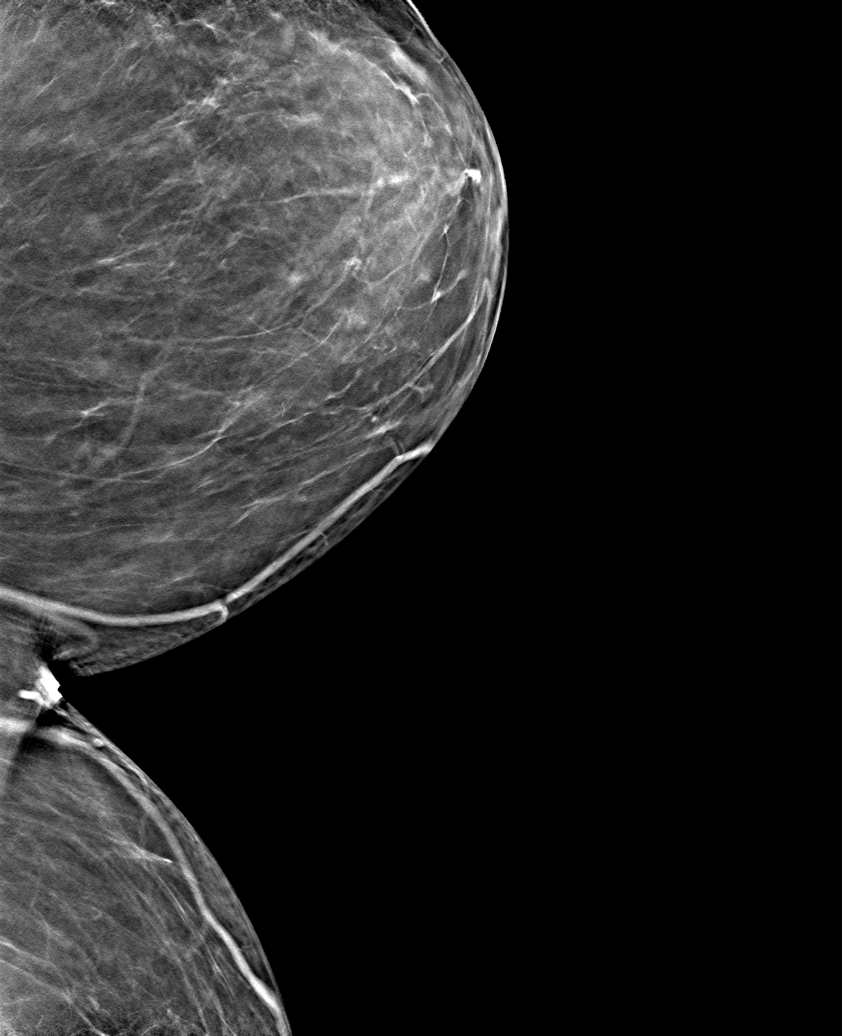

[6 of 30 positions shown; findings below may reference images not displayed]

FINDINGS: There are no findings suspicious for malignancy.
IMPRESSION: No mammographic evidence of malignancy. A result letter of this
screening mammogram will be mailed directly to the patient.

RECOMMENDATION:
Screening mammogram in one year. (Code:0E-3-N98)

BI-RADS CATEGORY  1: Negative.

## 2022-09-06 ENCOUNTER — Other Ambulatory Visit: Payer: Medicare PPO

## 2022-09-13 ENCOUNTER — Ambulatory Visit: Admit: 2022-09-13 | Payer: Medicare PPO | Admitting: Orthopedic Surgery

## 2022-09-13 SURGERY — EXCISION, GANGLION CYST, WRIST
Anesthesia: Choice | Site: Wrist | Laterality: Right

## 2022-09-15 ENCOUNTER — Other Ambulatory Visit: Payer: Self-pay | Admitting: Infectious Diseases

## 2022-09-15 ENCOUNTER — Ambulatory Visit
Admission: RE | Admit: 2022-09-15 | Discharge: 2022-09-15 | Disposition: A | Discharge status: Home / Self Care | Payer: Medicare PPO | Source: Ambulatory Visit | Attending: Infectious Diseases | Admitting: Infectious Diseases

## 2022-09-15 DIAGNOSIS — G8929 Other chronic pain: Secondary | ICD-10-CM

## 2022-09-15 DIAGNOSIS — R509 Fever, unspecified: Secondary | ICD-10-CM | POA: Insufficient documentation

## 2022-09-15 DIAGNOSIS — R1031 Right lower quadrant pain: Secondary | ICD-10-CM | POA: Insufficient documentation

## 2022-09-15 MED ORDER — IOHEXOL 350 MG/ML SOLN
100.0000 mL | Freq: Once | INTRAVENOUS | Status: AC | PRN
  Administered 2022-09-15: 100 mL via INTRAVENOUS

## 2022-09-20 ENCOUNTER — Other Ambulatory Visit: Payer: Self-pay | Admitting: Surgery

## 2022-09-20 DIAGNOSIS — K6812 Psoas muscle abscess: Secondary | ICD-10-CM

## 2022-09-20 NOTE — Progress Notes (Signed)
Case discussed with IR and he is agreeable to drainage.  Placed order so dept can arrange for procedure on this outpatient.

## 2022-09-21 NOTE — H&P (Signed)
Chief Complaint: Patient was seen in consultation today for right iliopsoas muscle fluid collection  Referring Physician(s): [place]  Supervising Physician: {Supervising Physician:[id number]}  Patient Status: [facility] - Out-pt  History of Present Illness: Kelsey Woods is a 71 y.o. female with a medical history significant for acute appendicitis with perforation and peritoneal abscess that required drainage (2019), cholecystectomy, morbid obesity, shingles and chronic kidney disease. She presented to her PCP 09/13/22 with complaints of lower abdominal pain radiating to her back and down her leg. She also had a low grade fever with nausea. Imaging obtained showed a right iliopsoas muscle fluid collection.     CT abdomen/pelvis with contrast 09/15/22 IMPRESSION: 1. Fluid collection within the right iliopsoas musculature, 4.7 x 2.2 cm, containing a calculus. This is generally consistent with residua of an abscess secondary to perforated appendicitis identified on prior examination dated 11/28/2017. The presence of residual or recurrent infection within this collection is not established by CT. 2. Interval appendectomy. 3. Sigmoid diverticulosis without evidence of acute diverticulitis.  She was referred to Surgery and was evaluated by Dr. Lysle Pearl 09/20/22. The patient was then referred to Interventional Radiology to attempted CT-guided drainage. Imaging reviewed and procedure approved by Dr. Denna Haggard.   Past Medical History:  Diagnosis Date   Acute appendicitis with perforation and peritoneal abscess    Anxiety    Arthritis    Complication of anesthesia    swelling of lips after gallblader surgery   GERD (gastroesophageal reflux disease)    Hypothyroidism    h/o no meds now   Ruptured appendicitis 11/28/2017   Sleep apnea     Past Surgical History:  Procedure Laterality Date   APPENDECTOMY     BREAST BIOPSY Right 1980's   Benign   CESAREAN SECTION     CHOLECYSTECTOMY      COLONOSCOPY WITH PROPOFOL N/A 12/29/2017   Procedure: COLONOSCOPY WITH PROPOFOL;  Surgeon: Lin Landsman, MD;  Location: [facility];  Service: Gastroenterology;  Laterality: N/A;   DILATION AND CURETTAGE OF UTERUS     JOINT REPLACEMENT Bilateral    knees   JOINT REPLACEMENT Right    hip   LAPAROSCOPIC APPENDECTOMY N/A 01/24/2018   Procedure: APPENDECTOMY LAPAROSCOPIC;  Surgeon: Olean Ree, MD;  Location: [facility] ORS;  Service: General;  Laterality: N/A;   NASAL SEPTUM SURGERY     REPLACEMENT TOTAL KNEE BILATERAL     REVERSE SHOULDER ARTHROPLASTY Left 06/15/2018   Procedure: TOTAL .REVERSE SHOULDER ARTHROPLASTY;  Surgeon: Corky Mull, MD;  Location: [facility] ORS;  Service: Orthopedics;  Laterality: Left;   TONSILLECTOMY     TOTAL HIP ARTHROPLASTY      Allergies: Naproxen sodium, Alpha-gal, and Sulfa antibiotics  Medications: Prior to Admission medications   Medication Sig Start Date End Date Taking? Authorizing Provider  acetaminophen (TYLENOL) 500 MG tablet Take 500 mg by mouth 3 (three) times daily as needed for moderate pain or headache.     [provider]  amoxicillin (AMOXIL) 500 MG capsule Take 2,000 mg by mouth See admin instructions. Take 2000 mg by mouth 1 hour prior to dental work    [provider]  amoxicillin (AMOXIL) 875 MG tablet Take 875 mg by mouth 2 (two) times daily.  09/22/18   [provider]  aspirin EC 81 MG tablet Take 81 mg by mouth daily.    [provider]  cetirizine (ZYRTEC) 10 MG tablet Take 10 mg by mouth daily.    [provider]  EPINEPHrine 0.3 mg/0.3  mL IJ SOAJ injection Inject 0.3 mg into the muscle Once PRN (for allergic reactions).  07/28/16   [provider]  esomeprazole (NEXIUM) 20 MG capsule Take 20 mg by mouth daily.    [provider]  fluticasone (FLONASE) 50 MCG/ACT nasal spray Place 2 sprays into both nostrils daily as needed for allergies.  06/16/17   [provider]   LORazepam (ATIVAN) 0.5 MG tablet Take 0.5 mg by mouth at bedtime as needed for sleep.  07/02/15   [provider]  meloxicam (MOBIC) 7.5 MG tablet Take 7.5 mg by mouth 2 (two) times daily.     [provider]  oxyCODONE (OXY IR/ROXICODONE) 5 MG immediate release tablet Take 1-2 tablets (5-10 mg total) by mouth every 4 (four) hours as needed for moderate pain. 06/16/18   Lattie Corns, PA-C  Propylene Glycol (SYSTANE BALANCE) 0.6 % SOLN Place 1 drop into both eyes 2 (two) times daily. May use 1 drop in both eyes an additional 2 times a day as needed for dry eyes    [provider]  tiZANidine (ZANAFLEX) 4 MG tablet Take 1-2 mg by mouth at bedtime as needed for muscle spasms.    [provider]  traMADol (ULTRAM) 50 MG tablet Take 1 tablet (50 mg total) by mouth every 6 (six) hours as needed (breakthrough pain). 06/16/18   Lattie Corns, PA-C     Family History  Problem Relation Age of Onset   Breast cancer Neg Hx     Social History   Socioeconomic History   Marital status: Married    Spouse name: Not on file   Number of children: Not on file   Years of education: Not on file   Highest education level: Not on file  Occupational History   Not on file  Tobacco Use   Smoking status: Never   Smokeless tobacco: Never  Vaping Use   Vaping Use: Never used  Substance and Sexual Activity   Alcohol use: Yes    Comment: 3-4 margharitas per week   Drug use: No   Sexual activity: Yes  Other Topics Concern   Not on file  Social History Narrative   Not on file   Social Determinants of Health   Financial Resource Strain: Not on file  Food Insecurity: Not on file  Transportation Needs: Not on file  Physical Activity: Not on file  Stress: Not on file  Social Connections: Not on file    Review of Systems: A 12 point ROS discussed and pertinent positives are indicated in the HPI above.  All other systems are negative.  Review of  Systems  Vital Signs: There were no vitals taken for this visit.  Physical Exam  Imaging: CT ABDOMEN PELVIS W CONTRAST  Result Date: 09/15/2022 CLINICAL DATA:  Right lower quadrant abdominal pain, loss of appetite, diarrhea and nausea, history of perforated appendicitis and abscess EXAM: CT ABDOMEN AND PELVIS WITH CONTRAST TECHNIQUE: Multidetector CT imaging of the abdomen and pelvis was performed using the standard protocol following bolus administration of intravenous contrast. RADIATION DOSE REDUCTION: This exam was performed according to the departmental dose-optimization program which includes automated exposure control, adjustment of the mA and/or kV according to patient size and/or use of iterative reconstruction technique. CONTRAST:  1107m OMNIPAQUE IOHEXOL 350 MG/ML SOLN additional oral enteric contrast COMPARISON:  11/28/2017 FINDINGS: Lower chest: No acute abnormality. Hepatobiliary: No solid liver abnormality is seen. No gallstones, gallbladder wall thickening, or biliary dilatation. Pancreas: Unremarkable.  No pancreatic ductal dilatation or surrounding inflammatory changes. Spleen: Normal in size without significant abnormality. Adrenals/Urinary Tract: Adrenal glands are unremarkable. Kidneys are normal, without renal calculi, solid lesion, or hydronephrosis. Bladder is unremarkable. Stomach/Bowel: Stomach is within normal limits. Appendix is surgically absent. No evidence of bowel wall thickening, distention, or inflammatory changes. Sigmoid diverticula. Vascular/Lymphatic: Aortic atherosclerosis. No enlarged abdominal or pelvic lymph nodes. Reproductive: No mass or other significant abnormality. Other: No abdominal wall hernia or abnormality. No ascites. Musculoskeletal: No acute or significant osseous findings. Within the right iliopsoas musculature, there is a circumscribed fluid collection measuring 4.7 x 2.2 cm, containing a calculus (series 2, image 64, 68). Status post right hip total  arthroplasty. IMPRESSION: 1. Fluid collection within the right iliopsoas musculature, 4.7 x 2.2 cm, containing a calculus. This is generally consistent with residua of an abscess secondary to perforated appendicitis identified on prior examination dated 11/28/2017. The presence of residual or recurrent infection within this collection is not established by CT. 2. Interval appendectomy. 3. Sigmoid diverticulosis without evidence of acute diverticulitis. Aortic Atherosclerosis (ICD10-I70.0). Electronically Signed   By: Delanna Ahmadi M.D.   On: 09/15/2022 19:02    Labs:  CBC: No results for input(s): "WBC", "HGB", "HCT", "PLT" in the last 8760 hours.  COAGS: No results for input(s): "INR", "APTT" in the last 8760 hours.  BMP: No results for input(s): "NA", "K", "CL", "CO2", "GLUCOSE", "BUN", "CALCIUM", "CREATININE", "GFRNONAA", "GFRAA" in the last 8760 hours.  Invalid input(s): "CMP"  LIVER FUNCTION TESTS: No results for input(s): "BILITOT", "AST", "ALT", "ALKPHOS", "PROT", "ALBUMIN" in the last 8760 hours.  TUMOR MARKERS: No results for input(s): "AFPTM", "CEA", "CA199", "CHROMGRNA" in the last 8760 hours.  Assessment and Plan:  Right Iliopsoas muscle fluid collection: Kelsey Woods, 71 year old female, presents today to the [facility] Interventional Radiology department for an image-guided right iliopsoas muscle fluid collection aspiration with possible drain placement.  Risks and benefits discussed with the patient including bleeding, infection, damage to adjacent structures, bowel perforation/fistula connection, and sepsis.  All of the patient's questions were answered, patient is agreeable to proceed. She has been NPO. She does not take any blood-thinning medications.  Consent signed and in chart.  Thank you for this interesting consult.  I greatly enjoyed meeting Kelsey Woods and look forward to participating in their care.  A copy of this report  was sent to the requesting provider on this date.  Electronically Signed: Soyla Dryer, AGACNP-BC [phone] 09/21/2022, 4:33 PM   I spent a total of  30 Minutes   in face to face in clinical consultation, greater than 50% of which was counseling/coordinating care for right iliopsoas muscle fluid collection aspiration with possible drain placement.

## 2022-09-21 NOTE — Progress Notes (Signed)
Patient for CT guided Peritoneal/Retroperitoneal fluid drain by perc cath on Wed 09/22/2022, I called and spoke with the patient on the phone and gave pre-procedure instructions. Pt was made aware to be here at 9:30a, last dose of ASA '81mg'$  was Mon 09/20/22 (ok'd to proceed with procedure by Dr Denna Haggard), NPO after MN prior to procedure as well as driver post procedure/recovery/discharge. Pt stated understanding.  Called 09/21/2022

## 2022-09-22 ENCOUNTER — Ambulatory Visit
Admission: RE | Admit: 2022-09-22 | Discharge: 2022-09-22 | Disposition: A | Discharge status: Home / Self Care | Payer: Medicare PPO | Source: Ambulatory Visit | Attending: Surgery | Admitting: Surgery

## 2022-09-22 ENCOUNTER — Other Ambulatory Visit: Payer: Self-pay | Admitting: Surgery

## 2022-09-22 ENCOUNTER — Other Ambulatory Visit: Payer: Self-pay

## 2022-09-22 DIAGNOSIS — R509 Fever, unspecified: Secondary | ICD-10-CM | POA: Diagnosis not present

## 2022-09-22 DIAGNOSIS — K6812 Psoas muscle abscess: Secondary | ICD-10-CM | POA: Diagnosis present

## 2022-09-22 DIAGNOSIS — R110 Nausea: Secondary | ICD-10-CM | POA: Diagnosis not present

## 2022-09-22 DIAGNOSIS — N189 Chronic kidney disease, unspecified: Secondary | ICD-10-CM | POA: Insufficient documentation

## 2022-09-22 DIAGNOSIS — Z6841 Body Mass Index (BMI) 40.0 and over, adult: Secondary | ICD-10-CM | POA: Diagnosis not present

## 2022-09-22 LAB — CBC
HCT: 36.2 % (ref 36.0–46.0)
Hemoglobin: 11.7 g/dL — ABNORMAL LOW (ref 12.0–15.0)
MCH: 31.4 pg (ref 26.0–34.0)
MCHC: 32.3 g/dL (ref 30.0–36.0)
MCV: 97.1 fL (ref 80.0–100.0)
Platelets: 411 10*3/uL — ABNORMAL HIGH (ref 150–400)
RBC: 3.73 MIL/uL — ABNORMAL LOW (ref 3.87–5.11)
RDW: 12.6 % (ref 11.5–15.5)
WBC: 6 10*3/uL (ref 4.0–10.5)
nRBC: 0 % (ref 0.0–0.2)

## 2022-09-22 LAB — PROTIME-INR
INR: 1.2 (ref 0.8–1.2)
Prothrombin Time: 15.2 seconds (ref 11.4–15.2)

## 2022-09-22 MED ORDER — MIDAZOLAM HCL 2 MG/2ML IJ SOLN
INTRAMUSCULAR | Status: AC | PRN
  Administered 2022-09-22 (×2): .5 mg via INTRAVENOUS

## 2022-09-22 MED ORDER — LIDOCAINE HCL (PF) 1 % IJ SOLN
10.0000 mL | Freq: Once | INTRAMUSCULAR | Status: AC
  Administered 2022-09-22: 10 mL via INTRADERMAL
  Filled 2022-09-22: qty 10

## 2022-09-22 MED ORDER — FENTANYL CITRATE (PF) 100 MCG/2ML IJ SOLN
INTRAMUSCULAR | Status: AC
  Filled 2022-09-22: qty 2

## 2022-09-22 MED ORDER — FENTANYL CITRATE (PF) 100 MCG/2ML IJ SOLN
INTRAMUSCULAR | Status: AC | PRN
  Administered 2022-09-22 (×3): 25 ug via INTRAVENOUS

## 2022-09-22 MED ORDER — MIDAZOLAM HCL 2 MG/2ML IJ SOLN
INTRAMUSCULAR | Status: AC
  Filled 2022-09-22: qty 4

## 2022-09-22 MED ORDER — SODIUM CHLORIDE 0.9 % IV SOLN: INTRAVENOUS | Status: DC

## 2022-09-22 NOTE — Procedures (Signed)
Interventional Radiology Procedure Note  Date of Procedure: 09/22/2022  Procedure: CT aspiration   Findings:  1. CT aspiration of right iliopsoas collection with return of 5-10 ml of cloudy sanguinous material    Complications: No immediate complications noted.   Estimated Blood Loss: minimal  Follow-up and Recommendations: 1. Bedrest 1 hour  2. Follow up microbiology results    Albin Felling, MD  Vascular & Interventional Radiology  09/22/2022 12:23 PM

## 2022-09-27 LAB — AEROBIC/ANAEROBIC CULTURE W GRAM STAIN (SURGICAL/DEEP WOUND)
Culture: NO GROWTH
Culture: NO GROWTH
Gram Stain: NONE SEEN
Gram Stain: NONE SEEN

## 2023-09-13 ENCOUNTER — Emergency Department: Payer: Medicare PPO

## 2023-09-13 ENCOUNTER — Observation Stay (HOSPITAL_BASED_OUTPATIENT_CLINIC_OR_DEPARTMENT_OTHER)
Admit: 2023-09-13 | Discharge: 2023-09-13 | Disposition: A | Discharge status: Home / Self Care | Payer: Medicare PPO | Attending: Internal Medicine | Admitting: Internal Medicine

## 2023-09-13 ENCOUNTER — Encounter: Payer: Self-pay | Admitting: Emergency Medicine

## 2023-09-13 ENCOUNTER — Observation Stay
Admission: EM | Admit: 2023-09-13 | Discharge: 2023-09-14 | Disposition: A | Discharge status: Home / Self Care | Payer: Medicare PPO | Attending: Family Medicine | Admitting: Family Medicine

## 2023-09-13 ENCOUNTER — Other Ambulatory Visit: Payer: Self-pay

## 2023-09-13 DIAGNOSIS — Z7982 Long term (current) use of aspirin: Secondary | ICD-10-CM | POA: Diagnosis not present

## 2023-09-13 DIAGNOSIS — Z79899 Other long term (current) drug therapy: Secondary | ICD-10-CM | POA: Diagnosis not present

## 2023-09-13 DIAGNOSIS — E039 Hypothyroidism, unspecified: Secondary | ICD-10-CM | POA: Diagnosis not present

## 2023-09-13 DIAGNOSIS — Z96653 Presence of artificial knee joint, bilateral: Secondary | ICD-10-CM | POA: Diagnosis not present

## 2023-09-13 DIAGNOSIS — Z96641 Presence of right artificial hip joint: Secondary | ICD-10-CM | POA: Diagnosis not present

## 2023-09-13 DIAGNOSIS — R55 Syncope and collapse: Secondary | ICD-10-CM

## 2023-09-13 DIAGNOSIS — X58XXXA Exposure to other specified factors, initial encounter: Secondary | ICD-10-CM | POA: Diagnosis not present

## 2023-09-13 DIAGNOSIS — K529 Noninfective gastroenteritis and colitis, unspecified: Secondary | ICD-10-CM | POA: Diagnosis not present

## 2023-09-13 DIAGNOSIS — Z6841 Body Mass Index (BMI) 40.0 and over, adult: Secondary | ICD-10-CM | POA: Insufficient documentation

## 2023-09-13 DIAGNOSIS — H05231 Hemorrhage of right orbit: Secondary | ICD-10-CM | POA: Diagnosis not present

## 2023-09-13 DIAGNOSIS — S0990XA Unspecified injury of head, initial encounter: Secondary | ICD-10-CM | POA: Insufficient documentation

## 2023-09-13 LAB — URINALYSIS, ROUTINE W REFLEX MICROSCOPIC
Bilirubin Urine: NEGATIVE
Glucose, UA: NEGATIVE mg/dL
Hgb urine dipstick: NEGATIVE
Ketones, ur: NEGATIVE mg/dL
Leukocytes,Ua: NEGATIVE
Nitrite: NEGATIVE
Protein, ur: NEGATIVE mg/dL
Specific Gravity, Urine: 1.014 (ref 1.005–1.030)
pH: 5 (ref 5.0–8.0)

## 2023-09-13 LAB — COMPREHENSIVE METABOLIC PANEL
ALT: 11 U/L (ref 0–44)
AST: 31 U/L (ref 15–41)
Albumin: 3.6 g/dL (ref 3.5–5.0)
Alkaline Phosphatase: 40 U/L (ref 38–126)
Anion gap: 7 (ref 5–15)
BUN: 22 mg/dL (ref 8–23)
CO2: 26 mmol/L (ref 22–32)
Calcium: 8.2 mg/dL — ABNORMAL LOW (ref 8.9–10.3)
Chloride: 103 mmol/L (ref 98–111)
Creatinine, Ser: 0.67 mg/dL (ref 0.44–1.00)
GFR, Estimated: >60 mL/min (ref >60)
Glucose, Bld: 126 mg/dL — ABNORMAL HIGH (ref 70–99)
Potassium: 4 mmol/L (ref 3.5–5.1)
Sodium: 136 mmol/L (ref 135–145)
Total Bilirubin: 0.8 mg/dL (ref <1.2)
Total Protein: 6.2 g/dL — ABNORMAL LOW (ref 6.5–8.1)

## 2023-09-13 LAB — LIPASE, BLOOD: Lipase: 22 U/L (ref 11–51)

## 2023-09-13 LAB — CBC WITH DIFFERENTIAL/PLATELET
Abs Immature Granulocytes: 0.06 10*3/uL (ref 0.00–0.07)
Basophils Absolute: 0 10*3/uL (ref 0.0–0.1)
Basophils Relative: 0 %
Eosinophils Absolute: 0 10*3/uL (ref 0.0–0.5)
Eosinophils Relative: 0 %
HCT: 42.8 % (ref 36.0–46.0)
Hemoglobin: 14.2 g/dL (ref 12.0–15.0)
Immature Granulocytes: 0 %
Lymphocytes Relative: 3 %
Lymphs Abs: 0.4 10*3/uL — ABNORMAL LOW (ref 0.7–4.0)
MCH: 33.6 pg (ref 26.0–34.0)
MCHC: 33.2 g/dL (ref 30.0–36.0)
MCV: 101.2 fL — ABNORMAL HIGH (ref 80.0–100.0)
Monocytes Absolute: 1 10*3/uL (ref 0.1–1.0)
Monocytes Relative: 7 %
Neutro Abs: 12.3 10*3/uL — ABNORMAL HIGH (ref 1.7–7.7)
Neutrophils Relative %: 90 %
Platelets: 245 10*3/uL (ref 150–400)
RBC: 4.23 MIL/uL (ref 3.87–5.11)
RDW: 12.7 % (ref 11.5–15.5)
WBC: 13.8 10*3/uL — ABNORMAL HIGH (ref 4.0–10.5)
nRBC: 0 % (ref 0.0–0.2)

## 2023-09-13 LAB — TROPONIN I (HIGH SENSITIVITY)
Troponin I (High Sensitivity): 4 ng/L (ref <18)
Troponin I (High Sensitivity): 4 ng/L (ref <18)
Troponin I (High Sensitivity): 5 ng/L (ref <18)

## 2023-09-13 MED ORDER — HYDROMORPHONE HCL 1 MG/ML IJ SOLN
0.5000 mg | INTRAMUSCULAR | Status: DC | PRN
  Administered 2023-09-13 – 2023-09-14 (×2): 0.5 mg via INTRAVENOUS
  Filled 2023-09-13 (×2): qty 0.5

## 2023-09-13 MED ORDER — SODIUM CHLORIDE 0.9 % IV BOLUS (SEPSIS)
1000.0000 mL | Freq: Once | INTRAVENOUS | Status: AC
  Administered 2023-09-13: 1000 mL via INTRAVENOUS

## 2023-09-13 MED ORDER — PREDNISOLONE ACETATE 1 % OP SUSP
1.0000 [drp] | Freq: Three times a day (TID) | OPHTHALMIC | Status: DC
  Administered 2023-09-13 – 2023-09-14 (×3): 1 [drp] via OPHTHALMIC
  Filled 2023-09-13: qty 5

## 2023-09-13 MED ORDER — ONDANSETRON HCL 4 MG PO TABS: 4.0000 mg | ORAL_TABLET | Freq: Four times a day (QID) | ORAL | Status: DC | PRN

## 2023-09-13 MED ORDER — ASPIRIN 81 MG PO TBEC
81.0000 mg | DELAYED_RELEASE_TABLET | Freq: Every day | ORAL | Status: DC
  Administered 2023-09-13 – 2023-09-14 (×2): 81 mg via ORAL
  Filled 2023-09-13 (×2): qty 1

## 2023-09-13 MED ORDER — MAGNESIUM OXIDE 400 MG PO TABS
400.0000 mg | ORAL_TABLET | Freq: Every day | ORAL | Status: DC
  Administered 2023-09-13 – 2023-09-14 (×2): 400 mg via ORAL
  Filled 2023-09-13 (×3): qty 1

## 2023-09-13 MED ORDER — OFLOXACIN 0.3 % OP SOLN: 1.0000 [drp] | OPHTHALMIC | Status: DC

## 2023-09-13 MED ORDER — MORPHINE SULFATE (PF) 4 MG/ML IV SOLN
4.0000 mg | Freq: Once | INTRAVENOUS | Status: AC
  Administered 2023-09-13: 4 mg via INTRAVENOUS
  Filled 2023-09-13: qty 1

## 2023-09-13 MED ORDER — BUTALBITAL-APAP-CAFFEINE 50-325-40 MG PO TABS
1.0000 | ORAL_TABLET | Freq: Four times a day (QID) | ORAL | Status: DC | PRN
  Administered 2023-09-13 – 2023-09-14 (×3): 1 via ORAL
  Filled 2023-09-13 (×3): qty 1

## 2023-09-13 MED ORDER — LORATADINE 10 MG PO TABS
10.0000 mg | ORAL_TABLET | Freq: Every day | ORAL | Status: DC
  Filled 2023-09-13: qty 1

## 2023-09-13 MED ORDER — POLYVINYL ALCOHOL 1.4 % OP SOLN
1.0000 [drp] | Freq: Two times a day (BID) | OPHTHALMIC | Status: DC
  Administered 2023-09-14 (×2): 1 [drp] via OPHTHALMIC
  Filled 2023-09-13 (×2): qty 15

## 2023-09-13 MED ORDER — SODIUM CHLORIDE 0.9% FLUSH
3.0000 mL | Freq: Two times a day (BID) | INTRAVENOUS | Status: DC
  Administered 2023-09-13 – 2023-09-14 (×3): 3 mL via INTRAVENOUS

## 2023-09-13 MED ORDER — ONDANSETRON HCL 4 MG/2ML IJ SOLN: 4.0000 mg | Freq: Four times a day (QID) | INTRAMUSCULAR | Status: DC | PRN

## 2023-09-13 MED ORDER — ONDANSETRON HCL 4 MG/2ML IJ SOLN
4.0000 mg | Freq: Once | INTRAMUSCULAR | Status: AC
Start: 2023-09-13 — End: 2023-09-13
  Administered 2023-09-13: 4 mg via INTRAVENOUS
  Filled 2023-09-13: qty 2

## 2023-09-13 NOTE — ED Provider Notes (Signed)
[facility] Provider Note    Event Date/Time   First MD Initiated Contact with Patient 09/13/23 0532     (approximate)   History   Loss of Consciousness   HPI  Kelsey Woods is a 72 y.o. female with history of hypothyroidism, obesity, chronic back pain who presents to the emergency department after a syncopal event.  Patient reports she got up around 2:30 AM because she felt nauseated.  She states she sat on the toilet and the next and she remembers was being on the ground and her husband yelling at her.  She states last time she passed out was 30 years ago.  Does not normally pass out when she gets sick to her stomach.  She has not had any recent vomiting.  Did have some diarrhea after the syncopal event.  No chest pain, palpitations or shortness of breath.  No lightheadedness.  Was in her normal state of health earlier today.  Has a large right periorbital hematoma.  Reports vision is at her baseline.  She is on aspirin but no other antiplatelet and no anticoagulant.  Complaining of facial pain, headache, neck pain, right shoulder pain.   History provided by patient, son.    Past Medical History:  Diagnosis Date   Acute appendicitis with perforation and peritoneal abscess    Anxiety    Arthritis    Complication of anesthesia    swelling of lips after gallblader surgery   GERD (gastroesophageal reflux disease)    Hypothyroidism    h/o no meds now   Ruptured appendicitis 11/28/2017   Sleep apnea     Past Surgical History:  Procedure Laterality Date   APPENDECTOMY     BREAST BIOPSY Right 1980's   Benign   CESAREAN SECTION     CHOLECYSTECTOMY     COLONOSCOPY WITH PROPOFOL N/A 12/29/2017   Procedure: COLONOSCOPY WITH PROPOFOL;  Surgeon: Toney Reil, MD;  Location: [facility] ENDOSCOPY;  Service: Gastroenterology;  Laterality: N/A;   DILATION AND CURETTAGE OF UTERUS     JOINT REPLACEMENT Bilateral    knees   JOINT REPLACEMENT Right    hip    LAPAROSCOPIC APPENDECTOMY N/A 01/24/2018   Procedure: APPENDECTOMY LAPAROSCOPIC;  Surgeon: Henrene Dodge, MD;  Location: [facility] ORS;  Service: General;  Laterality: N/A;   NASAL SEPTUM SURGERY     REPLACEMENT TOTAL KNEE BILATERAL     REVERSE SHOULDER ARTHROPLASTY Left 06/15/2018   Procedure: TOTAL .REVERSE SHOULDER ARTHROPLASTY;  Surgeon: Christena Flake, MD;  Location: [facility] ORS;  Service: Orthopedics;  Laterality: Left;   TONSILLECTOMY     TOTAL HIP ARTHROPLASTY      MEDICATIONS:  Prior to Admission medications   Medication Sig Start Date End Date Taking? Authorizing Provider  acetaminophen (TYLENOL) 500 MG tablet Take 500 mg by mouth 3 (three) times daily as needed for moderate pain or headache.     [provider]  amoxicillin (AMOXIL) 500 MG capsule Take 2,000 mg by mouth See admin instructions. Take 2000 mg by mouth 1 hour prior to dental work    [provider]  amoxicillin (AMOXIL) 875 MG tablet Take 875 mg by mouth 2 (two) times daily.  09/22/18   [provider]  aspirin EC 81 MG tablet Take 81 mg by mouth daily.    [provider]  cetirizine (ZYRTEC) 10 MG tablet Take 10 mg by mouth daily.    [provider]  EPINEPHrine 0.3 mg/0.3 mL IJ SOAJ injection  Inject 0.3 mg into the muscle Once PRN (for allergic reactions).  07/28/16   [provider]  esomeprazole (NEXIUM) 20 MG capsule Take 20 mg by mouth daily.    [provider]  fluticasone (FLONASE) 50 MCG/ACT nasal spray Place 2 sprays into both nostrils daily as needed for allergies.  06/16/17   [provider]  LORazepam (ATIVAN) 0.5 MG tablet Take 0.5 mg by mouth at bedtime as needed for sleep.  07/02/15   [provider]  meloxicam (MOBIC) 7.5 MG tablet Take 7.5 mg by mouth 2 (two) times daily.     [provider]  oxyCODONE (OXY IR/ROXICODONE) 5 MG immediate release tablet Take 1-2 tablets (5-10 mg total) by mouth every 4 (four) hours as needed for  moderate pain. Patient not taking: Reported on 09/22/2022 06/16/18   Anson Oregon, PA-C  Propylene Glycol (SYSTANE BALANCE) 0.6 % SOLN Place 1 drop into both eyes 2 (two) times daily. May use 1 drop in both eyes an additional 2 times a day as needed for dry eyes    [provider]  tiZANidine (ZANAFLEX) 4 MG tablet Take 1-2 mg by mouth at bedtime as needed for muscle spasms. Patient not taking: Reported on 09/22/2022    [provider]  traMADol (ULTRAM) 50 MG tablet Take 1 tablet (50 mg total) by mouth every 6 (six) hours as needed (breakthrough pain). Patient not taking: Reported on 09/22/2022 06/16/18   Anson Oregon, PA-C    Physical Exam   Triage Vital Signs: ED Triage Vitals [09/13/23 0526]  Encounter Vitals Group     BP      Systolic BP Percentile      Diastolic BP Percentile      Pulse      Resp      Temp      Temp src      SpO2      Weight 271 lb (122.9 kg)     Height 5\' 7"  (1.702 m)     Head Circumference      Peak Flow      Pain Score 10     Pain Loc      Pain Education      Exclude from Growth Chart     Most recent vital signs: Vitals:   09/13/23 0635 09/13/23 0700  BP: 133/66 (!) 113/48  Pulse: 96 84  Resp: 20 17  Temp:    SpO2: 100% 100%     CONSTITUTIONAL: Alert, responds appropriately to questions. Well-appearing; well-nourished; GCS 15 HEAD: Normocephalic; large right periorbital hematoma EYES: Conjunctivae clear, PERRL, EOMI, reports normal visual fields and normal vision, no subconjunctival hemorrhage, no hyphema or hypopyon, globe intact, no endophthalmitis, no chemosis ENT: normal nose; no rhinorrhea; moist mucous membranes; pharynx without lesions noted; no dental injury; no septal hematoma, no epistaxis; no facial deformity or bony tenderness NECK: Supple, tender to palpation over the lower cervical spine without step-off or deformity CARD: Regular and tachycardic; S1 and S2 appreciated; no murmurs, no clicks, no rubs,  no gallops RESP: Normal chest excursion without splinting or tachypnea; breath sounds clear and equal bilaterally; no wheezes, no rhonchi, no rales; no hypoxia or respiratory distress CHEST:  chest wall stable, no crepitus or ecchymosis or deformity, nontender to palpation; no flail chest ABD/GI: Non-distended; soft, non-tender, no rebound, no guarding; no ecchymosis or other lesions noted PELVIS:  stable, nontender to palpation BACK:  The back appears normal; no midline spinal tenderness, step-off or deformity  EXT: Tender to palpation over the right shoulder without deformity.  Normal ROM in all joints; no edema; normal capillary refill; no cyanosis, otherwise no bony tenderness or bony deformity of patient's extremities, no joint effusions, compartments are soft, extremities are warm and well-perfused, no ecchymosis, no calf tenderness or calf swelling SKIN: Normal color for age and race; warm NEURO: No facial asymmetry, normal speech, moving all extremities equally  ED Results / Procedures / Treatments   LABS: (all labs ordered are listed, but only abnormal results are displayed) Labs Reviewed  CBC WITH DIFFERENTIAL/PLATELET - Abnormal; Notable for the following components:      Result Value   WBC 13.8 (*)    MCV 101.2 (*)    Neutro Abs 12.3 (*)    Lymphs Abs 0.4 (*)    All other components within normal limits  URINALYSIS, ROUTINE W REFLEX MICROSCOPIC  COMPREHENSIVE METABOLIC PANEL  LIPASE, BLOOD  TROPONIN I (HIGH SENSITIVITY)  TROPONIN I (HIGH SENSITIVITY)     EKG:  EKG Interpretation Date/Time:  Tuesday September 13 2023 05:34:34 EST Ventricular Rate:  96 PR Interval:  174 QRS Duration:  94 QT Interval:  355 QTC Calculation: 449 R Axis:   -41  Text Interpretation: Sinus rhythm LAD, consider left anterior fascicular block Low voltage, precordial leads Consider anterior infarct Confirmed by Rochele Raring [phone]) on 09/13/2023 5:43:00 AM          RADIOLOGY: My  personal review and interpretation of imaging: CT head, face and cervical spine showed no intracranial hemorrhage, fracture.  Chest x-ray shows no widened mediastinum, cardiomegaly.  I have personally reviewed all radiology reports. CT Cervical Spine Wo Contrast  Result Date: 09/13/2023 CLINICAL DATA:  72 year old female with nausea at 0230 hours. Subsequent syncope. Right eye hematoma. EXAM: CT CERVICAL SPINE WITHOUT CONTRAST TECHNIQUE: Multidetector CT imaging of the cervical spine was performed without intravenous contrast. Multiplanar CT image reconstructions were also generated. RADIATION DOSE REDUCTION: This exam was performed according to the departmental dose-optimization program which includes automated exposure control, adjustment of the mA and/or kV according to patient size and/or use of iterative reconstruction technique. COMPARISON:  CT head and face today. FINDINGS: Alignment: Mild straightening of cervical lordosis. Mild degenerative appearing anterolisthesis at both C3-C4 and C4-C5, with associated facet arthropathy and also facet ankylosis detailed below. Similar mild anterolisthesis and facet degeneration at C7-T1. Bilateral posterior element alignment is within normal limits. Skull base and vertebrae: Visualized skull base is intact. No atlanto-occipital dissociation. C1 and C2 appear intact and aligned. No acute osseous abnormality identified. Soft tissues and spinal canal: No prevertebral fluid or swelling. No visible canal hematoma. Negative visible noncontrast neck soft tissues; mild cervical carotid calcified atherosclerosis. Disc levels: Cervical spine degeneration, especially widespread facet arthropathy, superimposed on degenerative appearing C4-C5 facet ankylosis. Bulky disc and endplate degeneration at C6-C7, to a lesser extent C5-C6. Mild multifactorial spinal stenosis suspected at C6-C7. Upper chest: Visible upper thoracic levels appear intact. Streak artifact from left shoulder  arthroplasty. Lung apices within normal limits. IMPRESSION: 1. No acute traumatic injury identified in the cervical spine. 2. Advanced cervical spine degeneration superimposed on degenerative C4-C5 facet ankylosis. Mild spinal stenosis suspected at C6-C7. Electronically Signed   By: Odessa Fleming M.D.   On: 09/13/2023 06:42   CT Maxillofacial Wo Contrast  Result Date: 09/13/2023 CLINICAL DATA:  72 year old female with nausea at 0230 hours. Subsequent syncope. Right eye hematoma. EXAM: CT MAXILLOFACIAL WITHOUT CONTRAST TECHNIQUE: Multidetector CT imaging of the maxillofacial structures was performed. Multiplanar  CT image reconstructions were also generated. RADIATION DOSE REDUCTION: This exam was performed according to the departmental dose-optimization program which includes automated exposure control, adjustment of the mA and/or kV according to patient size and/or use of iterative reconstruction technique. COMPARISON:  Head and cervical spine CT today. FINDINGS: Osseous: Bilateral TMJ degeneration. Mandible intact and normally located. No acute dental finding. Bilateral maxilla, zygoma, pterygoid, and nasal bones appear intact. Skull base appears intact. Orbits: No orbital wall fracture. Postoperative changes to both globes. Globes and intraorbital soft tissues appears symmetric and normal. Large preseptal, right periorbital soft tissue hematoma tracking up the forehead. No soft tissue gas. Sinuses: Clear throughout. Soft tissues: Negative visible noncontrast larynx, pharynx, parapharyngeal spaces, retropharyngeal space, sublingual space (benign asymmetry of the sublingual glands series 2, image 60), submandibular, masticator and parotid spaces. No other superficial soft tissue injury. No upper cervical lymphadenopathy. Mild cervical carotid calcified atherosclerosis. Limited intracranial: Stable to that reported separately. IMPRESSION: 1. Large right periorbital, sprees septal and forehead soft tissue hematoma. 2.  No orbital fracture or intraorbital injury. No other acute traumatic injury identified in the Face. Electronically Signed   By: Odessa Fleming M.D.   On: 09/13/2023 06:39   CT Head Wo Contrast  Result Date: 09/13/2023 CLINICAL DATA:  72 year old female with nausea at 0230 hours. Subsequent syncope. Right eye hematoma. EXAM: CT HEAD WITHOUT CONTRAST TECHNIQUE: Contiguous axial images were obtained from the base of the skull through the vertex without intravenous contrast. RADIATION DOSE REDUCTION: This exam was performed according to the departmental dose-optimization program which includes automated exposure control, adjustment of the mA and/or kV according to patient size and/or use of iterative reconstruction technique. COMPARISON:  Face and cervical spine CT today. Brain MRI 11/25/2005. FINDINGS: Brain: Cerebral volume remains normal for age, similar to the 2007 MRI. No midline shift, ventriculomegaly, mass effect, evidence of mass lesion, intracranial hemorrhage or evidence of cortically based acute infarction. Patchy mild to moderate for age white matter hypodensity, most pronounced at the deep white matter capsules. Progression since 2007. Otherwise preserved gray-white differentiation. Vascular: No suspicious intracranial vascular hyperdensity. Calcified atherosclerosis at the skull base. Skull: No fracture identified. Sinuses/Orbits: Visualized paranasal sinuses and mastoids are clear. Other: Large right anterior scalp convexity, right forehead and supraorbital soft tissue hematoma up to 18 mm in thickness. Preseptal orbit involvement on these images. Face CT reported separately. No scalp soft tissue gas. Underlying right frontal bone appears intact. IMPRESSION: 1. Large right anterior convexity scalp hematoma. No underlying skull fracture. Face CT reported separately. 2. No acute intracranial abnormality. But progressed since 2007, mild to moderate for age white matter changes most commonly due to small vessel  disease. Electronically Signed   By: Odessa Fleming M.D.   On: 09/13/2023 06:35   DG Shoulder Right Portable  Result Date: 09/13/2023 CLINICAL DATA:  72 year old female with nausea at 0230 hours. Subsequent syncope. Right eye hematoma. EXAM: RIGHT SHOULDER - 1 VIEW COMPARISON:  Chest radiographs 04/05/2018. FINDINGS: Three views at 0607 hours. No glenohumeral joint dislocation. Moderate to severe glenohumeral joint space loss and degenerative spurring. Proximal right humerus appears intact. Right clavicle and scapula appear intact. Right AC joint is aligned with mild to moderate degenerative spurring. Negative visible right ribs and chest. IMPRESSION: 1. No acute fracture or dislocation identified about the right shoulder. 2. Glenohumeral > AC joint degeneration. Electronically Signed   By: Odessa Fleming M.D.   On: 09/13/2023 06:24   DG Chest Portable 1 View  Result Date: 09/13/2023  CLINICAL DATA:  72 year old female with nausea at 0230 hours. Subsequent syncope. Right eye hematoma. EXAM: PORTABLE CHEST 1 VIEW COMPARISON:  Chest radiographs 04/05/2018. FINDINGS: Portable AP upright view at 0610 hours. New left total shoulder arthroplasty. Lung volumes and mediastinal contours remain within normal limits. Visualized tracheal air column is within normal limits. Allowing for portable technique the lungs are clear. No pneumothorax or pleural effusion. Right glenohumeral and AC joint degeneration. No acute osseous abnormality identified. Paucity of bowel gas in the visible abdomen. IMPRESSION: No acute cardiopulmonary abnormality or acute traumatic injury identified. Electronically Signed   By: Odessa Fleming M.D.   On: 09/13/2023 06:22     PROCEDURES:  Critical Care performed: No     .1-3 Lead EKG Interpretation  Performed by: Mertice Uffelman, Layla Maw, DO Authorized by: Nina Hoar, Layla Maw, DO     Interpretation: abnormal     ECG rate:  104   ECG rate assessment: tachycardic     Rhythm: sinus tachycardia     Ectopy: none      Conduction: normal       IMPRESSION / MDM / ASSESSMENT AND PLAN / ED COURSE  I reviewed the triage vital signs and the nursing notes.  Patient here after syncopal event with obvious head trauma.  The patient is on the cardiac monitor to evaluate for evidence of arrhythmia and/or significant heart rate changes.   DIFFERENTIAL DIAGNOSIS (includes but not limited to):   Facial contusion, facial fracture, intracranial hemorrhage, cervical spine fracture, shoulder fracture, contusion, ACS, arrhythmia, PE, electrolyte derangement, anemia, dehydration, UTI, vasovagal syncope  Patient's presentation is most consistent with acute presentation with potential threat to life or bodily function.  PLAN: Will obtain labs, urine, CT head, face and cervical spine, x-ray of the chest and right shoulder.  Will give pain and nausea medicine, IV fluids.   MEDICATIONS GIVEN IN ED: Medications  morphine (PF) 4 MG/ML injection 4 mg (4 mg Intravenous Given 09/13/23 0600)  ondansetron (ZOFRAN) injection 4 mg (4 mg Intravenous Given 09/13/23 0600)  sodium chloride 0.9 % bolus 1,000 mL (1,000 mLs Intravenous New Bag/Given 09/13/23 0604)     ED COURSE: Labs show leukocytosis of 13,000 which may be reactive.  Normal hemoglobin.  Troponin negative.  CMP, lipase pending.  CT head, face and cervical spine, x-rays reviewed and interpreted by myself and the radiologist and show no acute traumatic injury other than soft tissue hematoma.  Signed out to oncoming ED physician to follow-up on CMP, lipase and admit.   CONSULTS: Patient will be admitted to the hospitalist service for syncope.   OUTSIDE RECORDS REVIEWED: Reviewed last cardiology note in March 2024.       FINAL CLINICAL IMPRESSION(S) / ED DIAGNOSES   Final diagnoses:  Syncope, unspecified syncope type  Injury of head, initial encounter     Rx / DC Orders   ED Discharge Orders     None        Note:  This document was prepared using  Dragon voice recognition software and may include unintentional dictation errors.   Eriel Doyon, Layla Maw, DO 09/13/23 [phone]

## 2023-09-13 NOTE — ED Notes (Signed)
Assisted to restroom. Ambulatory with walker.

## 2023-09-13 NOTE — ED Notes (Signed)
Pt return from CT, placed back on cardiac monitoring. NAD noted, A&O x4. Son at bedside

## 2023-09-13 NOTE — Progress Notes (Signed)
*  PRELIMINARY RESULTS* Echocardiogram 2D Echocardiogram has been performed.  Kelsey Woods 09/13/2023, 2:28 PM

## 2023-09-13 NOTE — H&P (Signed)
History and Physical    Kelsey Woods XBJ:[id number] DOB: 10-01-1951 DOA: 09/13/2023  PCP: Mick Sell, MD (Confirm with patient/family/NH records and if not entered, this has to be entered at [facility] point of entry) Patient coming from: Home  I have personally briefly reviewed patient's old medical records in [facility]  Chief Complaint: I passed out  HPI: Kelsey Woods is a 72 y.o. female with medical history significant of chronic back pain, GERD, morbid obesity, presented with syncope.  Patient ate some leftover dinner from 2 days ago, then around 2:30 AM this morning, patient started to have cramping-like epigastric pain and nausea.  She felt like she is about to throw up and, then she went to bathroom, while sitting on toilet bowl and holding a trash can and bending forward to repeated retching positive no vomiting, she suddenly lost consciousness.  Next thing she knew was her husband holding her up.  No postictal confusion.  No loss control of urine or bowel wound.  She did have 2 loose diarrhea after she recovered her consciousness and abdominal pain subsided.  And no more feeling of nausea.  When she fell she hit her right eye and initially she was still able to open the right eye and see no vision changes however gradually the right eye became very swollen. ED Course: Afebrile, none tachycardia nonhypotensive nonhypoxic.  CT head and neck large right anterior convexity scalp hematoma, large right periorbital preseptal and forehead soft tissue hematoma no fracture no intraorbital injury no other acute traumatic injury identified in her face.  EKG showed normal sinus rhythm no QTc or PR interval alterations.  Review of Systems: As per HPI otherwise 14 point review of systems negative.    Past Medical History:  Diagnosis Date   Acute appendicitis with perforation and peritoneal abscess    Anxiety    Arthritis    Complication of anesthesia    swelling of lips  after gallblader surgery   GERD (gastroesophageal reflux disease)    Hypothyroidism    h/o no meds now   Ruptured appendicitis 11/28/2017   Sleep apnea     Past Surgical History:  Procedure Laterality Date   APPENDECTOMY     BREAST BIOPSY Right 1980's   Benign   CESAREAN SECTION     CHOLECYSTECTOMY     COLONOSCOPY WITH PROPOFOL N/A 12/29/2017   Procedure: COLONOSCOPY WITH PROPOFOL;  Surgeon: Toney Reil, MD;  Location: [facility] ENDOSCOPY;  Service: Gastroenterology;  Laterality: N/A;   DILATION AND CURETTAGE OF UTERUS     JOINT REPLACEMENT Bilateral    knees   JOINT REPLACEMENT Right    hip   LAPAROSCOPIC APPENDECTOMY N/A 01/24/2018   Procedure: APPENDECTOMY LAPAROSCOPIC;  Surgeon: Henrene Dodge, MD;  Location: [facility] ORS;  Service: General;  Laterality: N/A;   NASAL SEPTUM SURGERY     REPLACEMENT TOTAL KNEE BILATERAL     REVERSE SHOULDER ARTHROPLASTY Left 06/15/2018   Procedure: TOTAL .REVERSE SHOULDER ARTHROPLASTY;  Surgeon: Christena Flake, MD;  Location: [facility] ORS;  Service: Orthopedics;  Laterality: Left;   TONSILLECTOMY     TOTAL HIP ARTHROPLASTY       reports that she has never smoked. She has never used smokeless tobacco. She reports current alcohol use. She reports that she does not use drugs.  Allergies  Allergen Reactions   Naproxen Sodium Swelling   Alpha-Gal Nausea And Vomiting and Swelling   Sulfa Antibiotics     Eye drop only, can take  oral. Caused eyes to itch and turn red     Family History  Problem Relation Age of Onset   Breast cancer Neg Hx      Prior to Admission medications   Medication Sig Start Date End Date Taking? Authorizing Provider  acetaminophen (TYLENOL) 500 MG tablet Take 500 mg by mouth 3 (three) times daily as needed for moderate pain or headache.    Yes [provider]  aspirin EC 81 MG tablet Take 81 mg by mouth daily.   Yes [provider]  cetirizine (ZYRTEC) 10 MG tablet Take 10 mg by mouth daily.   Yes [provider]  Cranberry 125 MG TABS Take 1 tablet by mouth daily.   Yes [provider]  cyanocobalamin (VITAMIN B12) 1000 MCG tablet Take 1,000 mcg by mouth daily.   Yes [provider]  EPINEPHrine 0.3 mg/0.3 mL IJ SOAJ injection Inject 0.3 mg into the muscle Once PRN (for allergic reactions).  07/28/16  Yes [provider]  magnesium oxide (MAG-OX) 400 MG tablet Take 400 mg by mouth daily.   Yes [provider]  meloxicam (MOBIC) 7.5 MG tablet Take 7.5 mg by mouth 2 (two) times daily.    Yes [provider]  ofloxacin (OCUFLOX) 0.3 % ophthalmic solution Place 1 drop into both eyes every 4 (four) hours. 07/28/23  Yes [provider]  prednisoLONE acetate (PRED FORTE) 1 % ophthalmic suspension Place 1 drop into the right eye 3 (three) times daily. 07/28/23  Yes [provider]  Propylene Glycol (SYSTANE BALANCE) 0.6 % SOLN Place 1 drop into both eyes 2 (two) times daily. May use 1 drop in both eyes an additional 2 times a day as needed for dry eyes   Yes [provider]  amoxicillin (AMOXIL) 500 MG capsule Take 2,000 mg by mouth See admin instructions. Take 2000 mg by mouth 1 hour prior to dental work Patient not taking: Reported on 09/13/2023    [provider]  amoxicillin (AMOXIL) 875 MG tablet Take 875 mg by mouth 2 (two) times daily.  Patient not taking: Reported on 09/13/2023 09/22/18   [provider]  busPIRone (BUSPAR) 5 MG tablet Take 5 mg by mouth 2 (two) times daily. Patient not taking: Reported on 09/13/2023 05/25/23   [provider]  esomeprazole (NEXIUM) 20 MG capsule Take 20 mg by mouth daily. Patient not taking: Reported on 09/13/2023    [provider]  fluticasone (FLONASE) 50 MCG/ACT nasal spray Place 2 sprays into both nostrils daily as needed for allergies.  Patient not taking: Reported on 09/13/2023 06/16/17   [provider]  LORazepam (ATIVAN) 0.5 MG tablet Take  0.5 mg by mouth at bedtime as needed for sleep.  Patient not taking: Reported on 09/13/2023 07/02/15   [provider]  oxyCODONE (OXY IR/ROXICODONE) 5 MG immediate release tablet Take 1-2 tablets (5-10 mg total) by mouth every 4 (four) hours as needed for moderate pain. Patient not taking: Reported on 09/22/2022 06/16/18   Anson Oregon, PA-C  sertraline (ZOLOFT) 25 MG tablet Take 25 mg by mouth daily. Patient not taking: Reported on 09/13/2023 04/26/23   [provider]  tiZANidine (ZANAFLEX) 4 MG tablet Take 1-2 mg by mouth at bedtime as needed for muscle spasms. Patient not taking: Reported on 09/22/2022    [provider]  traMADol (ULTRAM) 50 MG tablet Take 1 tablet (50 mg total) by mouth every 6 (six) hours as needed (breakthrough pain). Patient  not taking: Reported on 09/22/2022 06/16/18   Anson Oregon, PA-C    Physical Exam: Vitals:   09/13/23 0700 09/13/23 0726 09/13/23 0900 09/13/23 1030  BP: (!) 113/48 (!) 111/94 (!) 109/46 (!) 111/56  Pulse: 84 75 89 84  Resp: 17 12 17 17   Temp:  97.8 F (36.6 C)    TempSrc:  Oral    SpO2: 100% 98% 99% 98%  Weight:      Height:        Constitutional: NAD, calm, comfortable Vitals:   09/13/23 0700 09/13/23 0726 09/13/23 0900 09/13/23 1030  BP: (!) 113/48 (!) 111/94 (!) 109/46 (!) 111/56  Pulse: 84 75 89 84  Resp: 17 12 17 17   Temp:  97.8 F (36.6 C)    TempSrc:  Oral    SpO2: 100% 98% 99% 98%  Weight:      Height:       Eyes: PERRL, lids and conjunctivae normal ENMT: Mucous membranes are moist. Posterior pharynx clear of any exudate or lesions.Normal dentition.  Neck: normal, supple, no masses, no thyromegaly Respiratory: clear to auscultation bilaterally, no wheezing, no crackles. Normal respiratory effort. No accessory muscle use.  Cardiovascular: Regular rate and rhythm, no murmurs / rubs / gallops. No extremity edema. 2+ pedal pulses. No carotid bruits.  Abdomen: no tenderness, no masses  palpated. No hepatosplenomegaly. Bowel sounds positive.  Musculoskeletal: no clubbing / cyanosis. No joint deformity upper and lower extremities. Good ROM, no contractures. Normal muscle tone.  Skin: Right periorbital hematoma and right forehead hematoma Neurologic: CN 2-12 grossly intact. Sensation intact, DTR normal. Strength 5/5 in all 4.  Psychiatric: Normal judgment and insight. Alert and oriented x 3. Normal mood.    Labs on Admission: I have personally reviewed following labs and imaging studies  CBC: Recent Labs  Lab 09/13/23 0558  WBC 13.8*  NEUTROABS 12.3*  HGB 14.2  HCT 42.8  MCV 101.2*  PLT 245   Basic Metabolic Panel: Recent Labs  Lab 09/13/23 0836  NA 136  K 4.0  CL 103  CO2 26  GLUCOSE 126*  BUN 22  CREATININE 0.67  CALCIUM 8.2*   GFR: Estimated Creatinine Clearance: 83.9 mL/min (by C-G formula based on SCr of 0.67 mg/dL). Liver Function Tests: Recent Labs  Lab 09/13/23 0836  AST 31  ALT 11  ALKPHOS 40  BILITOT 0.8  PROT 6.2*  ALBUMIN 3.6   Recent Labs  Lab 09/13/23 0836  LIPASE 22   No results for input(s): "AMMONIA" in the last 168 hours. Coagulation Profile: No results for input(s): "INR", "PROTIME" in the last 168 hours. Cardiac Enzymes: No results for input(s): "CKTOTAL", "CKMB", "CKMBINDEX", "TROPONINI" in the last 168 hours. BNP (last 3 results) No results for input(s): "PROBNP" in the last 8760 hours. HbA1C: No results for input(s): "HGBA1C" in the last 72 hours. CBG: No results for input(s): "GLUCAP" in the last 168 hours. Lipid Profile: No results for input(s): "CHOL", "HDL", "LDLCALC", "TRIG", "CHOLHDL", "LDLDIRECT" in the last 72 hours. Thyroid Function Tests: No results for input(s): "TSH", "T4TOTAL", "FREET4", "T3FREE", "THYROIDAB" in the last 72 hours. Anemia Panel: No results for input(s): "VITAMINB12", "FOLATE", "FERRITIN", "TIBC", "IRON", "RETICCTPCT" in the last 72 hours. Urine analysis:    Component Value  Date/Time   COLORURINE YELLOW (A) 09/13/2023 0750   APPEARANCEUR CLEAR (A) 09/13/2023 0750   APPEARANCEUR Clear 04/27/2014 1200   LABSPEC 1.014 09/13/2023 0750   LABSPEC 1.016 04/27/2014 1200   PHURINE 5.0 09/13/2023 0750   GLUCOSEU  NEGATIVE 09/13/2023 0750   GLUCOSEU Negative 04/27/2014 1200   HGBUR NEGATIVE 09/13/2023 0750   BILIRUBINUR NEGATIVE 09/13/2023 0750   BILIRUBINUR Negative 04/27/2014 1200   KETONESUR NEGATIVE 09/13/2023 0750   PROTEINUR NEGATIVE 09/13/2023 0750   NITRITE NEGATIVE 09/13/2023 0750   LEUKOCYTESUR NEGATIVE 09/13/2023 0750   LEUKOCYTESUR Negative 04/27/2014 1200    Radiological Exams on Admission: CT Cervical Spine Wo Contrast  Result Date: 09/13/2023 CLINICAL DATA:  72 year old female with nausea at 0230 hours. Subsequent syncope. Right eye hematoma. EXAM: CT CERVICAL SPINE WITHOUT CONTRAST TECHNIQUE: Multidetector CT imaging of the cervical spine was performed without intravenous contrast. Multiplanar CT image reconstructions were also generated. RADIATION DOSE REDUCTION: This exam was performed according to the departmental dose-optimization program which includes automated exposure control, adjustment of the mA and/or kV according to patient size and/or use of iterative reconstruction technique. COMPARISON:  CT head and face today. FINDINGS: Alignment: Mild straightening of cervical lordosis. Mild degenerative appearing anterolisthesis at both C3-C4 and C4-C5, with associated facet arthropathy and also facet ankylosis detailed below. Similar mild anterolisthesis and facet degeneration at C7-T1. Bilateral posterior element alignment is within normal limits. Skull base and vertebrae: Visualized skull base is intact. No atlanto-occipital dissociation. C1 and C2 appear intact and aligned. No acute osseous abnormality identified. Soft tissues and spinal canal: No prevertebral fluid or swelling. No visible canal hematoma. Negative visible noncontrast neck soft tissues;  mild cervical carotid calcified atherosclerosis. Disc levels: Cervical spine degeneration, especially widespread facet arthropathy, superimposed on degenerative appearing C4-C5 facet ankylosis. Bulky disc and endplate degeneration at C6-C7, to a lesser extent C5-C6. Mild multifactorial spinal stenosis suspected at C6-C7. Upper chest: Visible upper thoracic levels appear intact. Streak artifact from left shoulder arthroplasty. Lung apices within normal limits. IMPRESSION: 1. No acute traumatic injury identified in the cervical spine. 2. Advanced cervical spine degeneration superimposed on degenerative C4-C5 facet ankylosis. Mild spinal stenosis suspected at C6-C7. Electronically Signed   By: Odessa Fleming M.D.   On: 09/13/2023 06:42   CT Maxillofacial Wo Contrast  Result Date: 09/13/2023 CLINICAL DATA:  72 year old female with nausea at 0230 hours. Subsequent syncope. Right eye hematoma. EXAM: CT MAXILLOFACIAL WITHOUT CONTRAST TECHNIQUE: Multidetector CT imaging of the maxillofacial structures was performed. Multiplanar CT image reconstructions were also generated. RADIATION DOSE REDUCTION: This exam was performed according to the departmental dose-optimization program which includes automated exposure control, adjustment of the mA and/or kV according to patient size and/or use of iterative reconstruction technique. COMPARISON:  Head and cervical spine CT today. FINDINGS: Osseous: Bilateral TMJ degeneration. Mandible intact and normally located. No acute dental finding. Bilateral maxilla, zygoma, pterygoid, and nasal bones appear intact. Skull base appears intact. Orbits: No orbital wall fracture. Postoperative changes to both globes. Globes and intraorbital soft tissues appears symmetric and normal. Large preseptal, right periorbital soft tissue hematoma tracking up the forehead. No soft tissue gas. Sinuses: Clear throughout. Soft tissues: Negative visible noncontrast larynx, pharynx, parapharyngeal spaces,  retropharyngeal space, sublingual space (benign asymmetry of the sublingual glands series 2, image 60), submandibular, masticator and parotid spaces. No other superficial soft tissue injury. No upper cervical lymphadenopathy. Mild cervical carotid calcified atherosclerosis. Limited intracranial: Stable to that reported separately. IMPRESSION: 1. Large right periorbital, sprees septal and forehead soft tissue hematoma. 2. No orbital fracture or intraorbital injury. No other acute traumatic injury identified in the Face. Electronically Signed   By: Odessa Fleming M.D.   On: 09/13/2023 06:39   CT Head Wo Contrast  Result Date: 09/13/2023 CLINICAL  DATA:  72 year old female with nausea at 0230 hours. Subsequent syncope. Right eye hematoma. EXAM: CT HEAD WITHOUT CONTRAST TECHNIQUE: Contiguous axial images were obtained from the base of the skull through the vertex without intravenous contrast. RADIATION DOSE REDUCTION: This exam was performed according to the departmental dose-optimization program which includes automated exposure control, adjustment of the mA and/or kV according to patient size and/or use of iterative reconstruction technique. COMPARISON:  Face and cervical spine CT today. Brain MRI 11/25/2005. FINDINGS: Brain: Cerebral volume remains normal for age, similar to the 2007 MRI. No midline shift, ventriculomegaly, mass effect, evidence of mass lesion, intracranial hemorrhage or evidence of cortically based acute infarction. Patchy mild to moderate for age white matter hypodensity, most pronounced at the deep white matter capsules. Progression since 2007. Otherwise preserved gray-white differentiation. Vascular: No suspicious intracranial vascular hyperdensity. Calcified atherosclerosis at the skull base. Skull: No fracture identified. Sinuses/Orbits: Visualized paranasal sinuses and mastoids are clear. Other: Large right anterior scalp convexity, right forehead and supraorbital soft tissue hematoma up to 18 mm  in thickness. Preseptal orbit involvement on these images. Face CT reported separately. No scalp soft tissue gas. Underlying right frontal bone appears intact. IMPRESSION: 1. Large right anterior convexity scalp hematoma. No underlying skull fracture. Face CT reported separately. 2. No acute intracranial abnormality. But progressed since 2007, mild to moderate for age white matter changes most commonly due to small vessel disease. Electronically Signed   By: Odessa Fleming M.D.   On: 09/13/2023 06:35   DG Shoulder Right Portable  Result Date: 09/13/2023 CLINICAL DATA:  72 year old female with nausea at 0230 hours. Subsequent syncope. Right eye hematoma. EXAM: RIGHT SHOULDER - 1 VIEW COMPARISON:  Chest radiographs 04/05/2018. FINDINGS: Three views at 0607 hours. No glenohumeral joint dislocation. Moderate to severe glenohumeral joint space loss and degenerative spurring. Proximal right humerus appears intact. Right clavicle and scapula appear intact. Right AC joint is aligned with mild to moderate degenerative spurring. Negative visible right ribs and chest. IMPRESSION: 1. No acute fracture or dislocation identified about the right shoulder. 2. Glenohumeral > AC joint degeneration. Electronically Signed   By: Odessa Fleming M.D.   On: 09/13/2023 06:24   DG Chest Portable 1 View  Result Date: 09/13/2023 CLINICAL DATA:  72 year old female with nausea at 0230 hours. Subsequent syncope. Right eye hematoma. EXAM: PORTABLE CHEST 1 VIEW COMPARISON:  Chest radiographs 04/05/2018. FINDINGS: Portable AP upright view at 0610 hours. New left total shoulder arthroplasty. Lung volumes and mediastinal contours remain within normal limits. Visualized tracheal air column is within normal limits. Allowing for portable technique the lungs are clear. No pneumothorax or pleural effusion. Right glenohumeral and AC joint degeneration. No acute osseous abnormality identified. Paucity of bowel gas in the visible abdomen. IMPRESSION: No acute  cardiopulmonary abnormality or acute traumatic injury identified. Electronically Signed   By: Odessa Fleming M.D.   On: 09/13/2023 06:22    EKG: Independently reviewed.  Sinus rhythm, no acute ST changes, no acute PR or QTc interval changes.  Assessment/Plan Principal Problem:   Syncope Active Problems:   Syncope, vasovagal  (please populate well all problems here in Problem List. (For example, if patient is on BP meds at home and you resume or decide to hold them, it is a problem that needs to be her. Same for CAD, COPD, HLD and so on)  Vasovagal syncope -Likely secondary to nausea and likely Valsalva like movement. -Other etiology, telemonitoring x 24 hours and echocardiogram to rule out any cardiac ischemia  or structural abnormalities can potentially lead to syncope.  If negative likely patient can go home with outpatient cardiology follow-up.  Large right periorbital and right forehead hematoma -No fracture in orbital wall or septal or skull by CT head and CT face -Ice pack, symptomatic management  Acute gastroenteritis -Likely secondary to food toxin -Appeared to be self-limiting, and the most of her symptoms resolved.  No indication for further workup or intervention.  Morbid obesity -Calorie control recommended.  DVT prophylaxis: SCD Code Status: Full code Family Communication: Son and daughter-in-law at bedside Disposition Plan: Expect less than 2 midnight hospital stay Consults called: None Admission status: Tele obs   Emeline General MD [facility] Pager [phone]  09/13/2023, 11:19 AM

## 2023-09-13 NOTE — ED Notes (Signed)
Patient given ice pack to place on bruising swollen right eye.

## 2023-09-13 NOTE — ED Notes (Signed)
XRAY tech at the bedside for films

## 2023-09-13 NOTE — ED Notes (Signed)
This RN attempted for drawn CMP/troponin for redraw. Per lab, both samples sent down on night shift were hemolyzed. This RN was not able to obtain specimen; called phlebotomy for assistance.

## 2023-09-13 NOTE — ED Notes (Signed)
Admitting provider at patient bedside.

## 2023-09-13 NOTE — ED Triage Notes (Signed)
Pt to triage via w/c with no distress noted; pt reports awoke at 230am feeling nauseated; got OOB to go to BR, sat down on the toilet and had syncopal episode; pt with large hematoma to rt eye, swelling to rt side forehead with c/o HA

## 2023-09-13 NOTE — ED Notes (Signed)
Patient transported to CT 

## 2023-09-14 DIAGNOSIS — R55 Syncope and collapse: Secondary | ICD-10-CM | POA: Diagnosis not present

## 2023-09-14 LAB — CBC
HCT: 35.6 % — ABNORMAL LOW (ref 36.0–46.0)
Hemoglobin: 11.9 g/dL — ABNORMAL LOW (ref 12.0–15.0)
MCH: 33.5 pg (ref 26.0–34.0)
MCHC: 33.4 g/dL (ref 30.0–36.0)
MCV: 100.3 fL — ABNORMAL HIGH (ref 80.0–100.0)
Platelets: 208 10*3/uL (ref 150–400)
RBC: 3.55 MIL/uL — ABNORMAL LOW (ref 3.87–5.11)
RDW: 12.7 % (ref 11.5–15.5)
WBC: 6.9 10*3/uL (ref 4.0–10.5)
nRBC: 0 % (ref 0.0–0.2)

## 2023-09-14 LAB — ECHOCARDIOGRAM COMPLETE
AR max vel: 2.29 cm2
AV Area VTI: 2.67 cm2
AV Area mean vel: 2.34 cm2
AV Mean grad: 7 mm[Hg]
AV Peak grad: 13 mm[Hg]
Ao pk vel: 1.8 m/s
Area-P 1/2: 2.96 cm2
Height: 67 in
MV VTI: 3.48 cm2
S' Lateral: 2.8 cm
Single Plane A4C EF: 66.9 %
Weight: 4336 [oz_av]

## 2023-09-14 LAB — GLUCOSE, CAPILLARY: Glucose-Capillary: 95 mg/dL (ref 70–99)

## 2023-09-14 NOTE — Discharge Summary (Signed)
Physician Discharge Summary   Patient: Kelsey Woods MRN: [id number] DOB: 10/25/1951  Admit date:     09/13/2023  Discharge date: 09/14/23  Discharge Physician: Tyrone Nine   PCP: Mick Sell, MD   Recommendations at discharge:  Follow up with cardiology after discharge to discuss cardiac monitoring as an outpatient.  Follow up with PCP in next 1-2 weeks.  Discharge Diagnoses: Principal Problem:   Syncope Active Problems:   Syncope, vasovagal  Hospital Course: Kelsey Woods is a 72 y.o. female with medical history significant of chronic back pain, GERD, morbid obesity, presented with syncope.   Patient ate some leftover dinner from 2 days ago, then around 2:30 AM this morning, patient started to have cramping-like epigastric pain and nausea.  She felt like she is about to throw up and, then she went to bathroom, while sitting on toilet bowl and holding a trash can and bending forward to repeated retching positive no vomiting, she suddenly lost consciousness.  Next thing she knew was her husband holding her up.  No postictal confusion.  No loss control of urine or bowel wound.  She did have 2 loose diarrhea after she recovered her consciousness and abdominal pain subsided.  And no more feeling of nausea.  When she fell she hit her right eye and initially she was still able to open the right eye and see no vision changes however gradually the right eye became very swollen. ED Course: Afebrile, none tachycardia nonhypotensive nonhypoxic.  CT head and neck large right anterior convexity scalp hematoma, large right periorbital preseptal and forehead soft tissue hematoma no fracture no intraorbital injury no other acute traumatic injury identified in her face.  EKG showed normal sinus rhythm no QTc or PR interval alterations.  Echocardiogram and cardiac monitoring were reassuring. She feels much better the following day and is stable for discharge.   Assessment and  Plan: Vasovagal syncope: Echo showed LVEF 60-65%, normal diastolic function, RV size/function and valves (trivial MR mentioned). No structural disease that would increase risk for SCD or cardiogenic syncope. Pt's brother died of SCD at age 5, though she's received regular work up with cardiology. Discussed with Dr. Melton Alar, cardiology will arrange follow up to discuss outpatient monitoring. This episode, however, appears very consistent with vasovagal etiology without recurrence, other previous episodes, dysrhythmia on telemetry.    Large right periorbital and right forehead hematoma: No fracture in orbital wall or septal or skull by CT head and CT face. Ecchymoses already improving. Vision unchanged.  - Follow up with ophthalmology after discharge (as was the plan s/p cataract surgeries weeks ago).  - Supportive care. Given widespread ecchymoses, suggest relying on tylenol acutely to lower bleeding risk inherent with NSAIDs.    Acute gastroenteritis: Symptoms resolved.    Morbid obesity: Body mass index is 43.76 kg/m.  Consultants: Cardiology remotely Procedures performed: Echo  Disposition: Home Diet recommendation:  Cardiac diet DISCHARGE MEDICATION: Allergies as of 09/14/2023       Reactions   Naproxen Sodium Swelling   Alpha-gal Nausea And Vomiting, Swelling   Sulfa Antibiotics    Eye drop only, can take oral. Caused eyes to itch and turn red         Medication List     STOP taking these medications    busPIRone 5 MG tablet Commonly known as: BUSPAR   esomeprazole 20 MG capsule Commonly known as: NEXIUM   fluticasone 50 MCG/ACT nasal spray Commonly known as: FLONASE   LORazepam 0.5  MG tablet Commonly known as: ATIVAN   oxyCODONE 5 MG immediate release tablet Commonly known as: Oxy IR/ROXICODONE   sertraline 25 MG tablet Commonly known as: ZOLOFT   tiZANidine 4 MG tablet Commonly known as: ZANAFLEX   traMADol 50 MG tablet Commonly known as: ULTRAM        TAKE these medications    acetaminophen 500 MG tablet Commonly known as: TYLENOL Take 500 mg by mouth 3 (three) times daily as needed for moderate pain or headache.   amoxicillin 500 MG capsule Commonly known as: AMOXIL Take 2,000 mg by mouth See admin instructions. Take 2000 mg by mouth 1 hour prior to dental work What changed: Another medication with the same name was removed. Continue taking this medication, and follow the directions you see here.   aspirin EC 81 MG tablet Take 81 mg by mouth daily.   cetirizine 10 MG tablet Commonly known as: ZYRTEC Take 10 mg by mouth daily.   Cranberry 125 MG Tabs Take 1 tablet by mouth daily.   cyanocobalamin 1000 MCG tablet Commonly known as: VITAMIN B12 Take 1,000 mcg by mouth daily.   EPINEPHrine 0.3 mg/0.3 mL Soaj injection Commonly known as: EPI-PEN Inject 0.3 mg into the muscle Once PRN (for allergic reactions).   magnesium oxide 400 MG tablet Commonly known as: MAG-OX Take 400 mg by mouth daily.   meloxicam 7.5 MG tablet Commonly known as: MOBIC Take 7.5 mg by mouth 2 (two) times daily.   ofloxacin 0.3 % ophthalmic solution Commonly known as: OCUFLOX Place 1 drop into both eyes every 4 (four) hours.   prednisoLONE acetate 1 % ophthalmic suspension Commonly known as: PRED FORTE Place 1 drop into the right eye 3 (three) times daily.   Systane Balance 0.6 % Soln Generic drug: Propylene Glycol Place 1 drop into both eyes 2 (two) times daily. May use 1 drop in both eyes an additional 2 times a day as needed for dry eyes        Follow-up Information     Mick Sell, MD Follow up.   Specialty: Infectious Diseases Contact information: [address] [place] [address] [phone]         Tiajuana Amass, MD Follow up.   Specialty: Cardiology Why: Clinic will call you to schedule appointment Contact information: [address] [place] [address] [phone]                 Discharge Exam: [name] Weights   09/13/23 0526 09/13/23 2148 09/14/23 0500  Weight: 122.9 kg 123 kg 123 kg  BP (!) 124/58   Pulse 66   Temp 98.6 F (37 C)   Resp 16   Ht 5\' 6"  (1.676 m)   Wt 123 kg   SpO2 99%   BMI 43.76 kg/m   No distress RRR, no MRG or pitting edema Telemetry shows occasional artifact, rare PVCs, no sustained dysrhythmia  Condition at discharge: stable  The results of significant diagnostics from this hospitalization (including imaging, microbiology, ancillary and laboratory) are listed below for reference.   Imaging Studies: ECHOCARDIOGRAM COMPLETE  Result Date: 09/14/2023    ECHOCARDIOGRAM REPORT   Patient Name:   ETHYLE TIEDT Date of Exam: 09/13/2023 Medical Rec #:  [id number]           Height:       67.0 in Accession #:    [id number]          Weight:  271.0 lb Date of Birth:  1951/04/20          BSA:          2.301 m Patient Age:    16 years            BP:           108/62 mmHg Patient Gender: F                   HR:           68 bpm. Exam Location:  [facility] Procedure: 2D Echo, Cardiac Doppler, Color Doppler and Strain Analysis Indications:     Syncope  History:         Patient has prior history of Echocardiogram examinations, most                  recent 06/27/2020. Signs/Symptoms:Syncope and Dyspnea; Risk                  Factors:Sleep Apnea. CKD.  Sonographer:     Mikki Harbor Referring Phys:  [id number] Emeline General Diagnosing Phys: Clotilde Dieter  Sonographer Comments: Patient is obese. Global longitudinal strain was attempted. IMPRESSIONS  1. Left ventricular ejection fraction, by estimation, is 60 to 65%. The left ventricle has normal function. The left ventricle has no regional wall motion abnormalities. Left ventricular diastolic parameters were normal.  2. Right ventricular systolic function is normal. The right ventricular size is normal. There is normal pulmonary artery systolic pressure. The estimated right ventricular systolic pressure is 28.2  mmHg.  3. The mitral valve is grossly normal. Trivial mitral valve regurgitation.  4. The aortic valve is grossly normal. Aortic valve regurgitation is not visualized. No aortic stenosis is present. FINDINGS  Left Ventricle: Left ventricular ejection fraction, by estimation, is 60 to 65%. The left ventricle has normal function. The left ventricle has no regional wall motion abnormalities. The left ventricular internal cavity size was normal in size. There is  no left ventricular hypertrophy. Left ventricular diastolic parameters were normal. Right Ventricle: The right ventricular size is normal. No increase in right ventricular wall thickness. Right ventricular systolic function is normal. There is normal pulmonary artery systolic pressure. The tricuspid regurgitant velocity is 2.51 m/s, and  with an assumed right atrial pressure of 3 mmHg, the estimated right ventricular systolic pressure is 28.2 mmHg. Left Atrium: Left atrial size was normal in size. Right Atrium: Right atrial size was normal in size. Pericardium: There is no evidence of pericardial effusion. Mitral Valve: The mitral valve is grossly normal. Trivial mitral valve regurgitation. MV peak gradient, 4.0 mmHg. The mean mitral valve gradient is 1.0 mmHg. Tricuspid Valve: The tricuspid valve is grossly normal. Tricuspid valve regurgitation is mild. Aortic Valve: The aortic valve is grossly normal. Aortic valve regurgitation is not visualized. No aortic stenosis is present. Aortic valve mean gradient measures 7.0 mmHg. Aortic valve peak gradient measures 13.0 mmHg. Aortic valve area, by VTI measures  2.67 cm. Pulmonic Valve: The pulmonic valve was grossly normal. Pulmonic valve regurgitation is not visualized. Aorta: The aortic root is normal in size and structure. IAS/Shunts: No atrial level shunt detected by color flow Doppler.  LEFT VENTRICLE PLAX 2D LVIDd:         5.00 cm     Diastology LVIDs:         2.80 cm     LV e' medial:    10.30 cm/s LV PW:  0.90 cm     LV E/e' medial:  7.5 LV IVS:        0.90 cm     LV e' lateral:   8.49 cm/s LVOT diam:     2.00 cm     LV E/e' lateral: 9.1 LV SV:         100 LV SV Index:   43 LVOT Area:     3.14 cm  LV Volumes (MOD) LV vol d, MOD A4C: 82.7 ml LV vol s, MOD A4C: 27.4 ml LV SV MOD A4C:     82.7 ml RIGHT VENTRICLE RV Basal diam:  3.55 cm RV Mid diam:    3.40 cm RV S prime:     16.50 cm/s TAPSE (M-mode): 2.9 cm LEFT ATRIUM             Index        RIGHT ATRIUM           Index LA diam:        3.60 cm 1.56 cm/m   RA Area:     13.60 cm LA Vol (A2C):   63.4 ml 27.55 ml/m  RA Volume:   32.40 ml  14.08 ml/m LA Vol (A4C):   44.2 ml 19.21 ml/m LA Biplane Vol: 55.6 ml 24.16 ml/m  AORTIC VALVE                     PULMONIC VALVE AV Area (Vmax):    2.29 cm      PV Vmax:       1.17 m/s AV Area (Vmean):   2.34 cm      PV Peak grad:  5.5 mmHg AV Area (VTI):     2.67 cm AV Vmax:           180.00 cm/s AV Vmean:          117.000 cm/s AV VTI:            0.374 m AV Peak Grad:      13.0 mmHg AV Mean Grad:      7.0 mmHg LVOT Vmax:         131.00 cm/s LVOT Vmean:        87.200 cm/s LVOT VTI:          0.318 m LVOT/AV VTI ratio: 0.85  AORTA Ao Root diam: 3.10 cm MITRAL VALVE               TRICUSPID VALVE MV Area (PHT): 2.96 cm    TR Peak grad:   25.2 mmHg MV Area VTI:   3.48 cm    TR Vmax:        251.00 cm/s MV Peak grad:  4.0 mmHg MV Mean grad:  1.0 mmHg    SHUNTS MV Vmax:       1.00 m/s    Systemic VTI:  0.32 m MV Vmean:      51.4 cm/s   Systemic Diam: 2.00 cm MV Decel Time: 256 msec MV E velocity: 77.20 cm/s MV A velocity: 75.20 cm/s MV E/A ratio:  1.03 Sabina Custovic Electronically signed by Clotilde Dieter Signature Date/Time: 09/14/2023/11:38:18 AM    Final    CT Cervical Spine Wo Contrast  Result Date: 09/13/2023 CLINICAL DATA:  72 year old female with nausea at 0230 hours. Subsequent syncope. Right eye hematoma. EXAM: CT CERVICAL SPINE WITHOUT CONTRAST TECHNIQUE: Multidetector CT imaging of the cervical spine was  performed without intravenous contrast. Multiplanar CT image reconstructions were also generated.  RADIATION DOSE REDUCTION: This exam was performed according to the departmental dose-optimization program which includes automated exposure control, adjustment of the mA and/or kV according to patient size and/or use of iterative reconstruction technique. COMPARISON:  CT head and face today. FINDINGS: Alignment: Mild straightening of cervical lordosis. Mild degenerative appearing anterolisthesis at both C3-C4 and C4-C5, with associated facet arthropathy and also facet ankylosis detailed below. Similar mild anterolisthesis and facet degeneration at C7-T1. Bilateral posterior element alignment is within normal limits. Skull base and vertebrae: Visualized skull base is intact. No atlanto-occipital dissociation. C1 and C2 appear intact and aligned. No acute osseous abnormality identified. Soft tissues and spinal canal: No prevertebral fluid or swelling. No visible canal hematoma. Negative visible noncontrast neck soft tissues; mild cervical carotid calcified atherosclerosis. Disc levels: Cervical spine degeneration, especially widespread facet arthropathy, superimposed on degenerative appearing C4-C5 facet ankylosis. Bulky disc and endplate degeneration at C6-C7, to a lesser extent C5-C6. Mild multifactorial spinal stenosis suspected at C6-C7. Upper chest: Visible upper thoracic levels appear intact. Streak artifact from left shoulder arthroplasty. Lung apices within normal limits. IMPRESSION: 1. No acute traumatic injury identified in the cervical spine. 2. Advanced cervical spine degeneration superimposed on degenerative C4-C5 facet ankylosis. Mild spinal stenosis suspected at C6-C7. Electronically Signed   By: Odessa Fleming M.D.   On: 09/13/2023 06:42   CT Maxillofacial Wo Contrast  Result Date: 09/13/2023 CLINICAL DATA:  72 year old female with nausea at 0230 hours. Subsequent syncope. Right eye hematoma. EXAM: CT  MAXILLOFACIAL WITHOUT CONTRAST TECHNIQUE: Multidetector CT imaging of the maxillofacial structures was performed. Multiplanar CT image reconstructions were also generated. RADIATION DOSE REDUCTION: This exam was performed according to the departmental dose-optimization program which includes automated exposure control, adjustment of the mA and/or kV according to patient size and/or use of iterative reconstruction technique. COMPARISON:  Head and cervical spine CT today. FINDINGS: Osseous: Bilateral TMJ degeneration. Mandible intact and normally located. No acute dental finding. Bilateral maxilla, zygoma, pterygoid, and nasal bones appear intact. Skull base appears intact. Orbits: No orbital wall fracture. Postoperative changes to both globes. Globes and intraorbital soft tissues appears symmetric and normal. Large preseptal, right periorbital soft tissue hematoma tracking up the forehead. No soft tissue gas. Sinuses: Clear throughout. Soft tissues: Negative visible noncontrast larynx, pharynx, parapharyngeal spaces, retropharyngeal space, sublingual space (benign asymmetry of the sublingual glands series 2, image 60), submandibular, masticator and parotid spaces. No other superficial soft tissue injury. No upper cervical lymphadenopathy. Mild cervical carotid calcified atherosclerosis. Limited intracranial: Stable to that reported separately. IMPRESSION: 1. Large right periorbital, sprees septal and forehead soft tissue hematoma. 2. No orbital fracture or intraorbital injury. No other acute traumatic injury identified in the Face. Electronically Signed   By: Odessa Fleming M.D.   On: 09/13/2023 06:39   CT Head Wo Contrast  Result Date: 09/13/2023 CLINICAL DATA:  72 year old female with nausea at 0230 hours. Subsequent syncope. Right eye hematoma. EXAM: CT HEAD WITHOUT CONTRAST TECHNIQUE: Contiguous axial images were obtained from the base of the skull through the vertex without intravenous contrast. RADIATION DOSE  REDUCTION: This exam was performed according to the departmental dose-optimization program which includes automated exposure control, adjustment of the mA and/or kV according to patient size and/or use of iterative reconstruction technique. COMPARISON:  Face and cervical spine CT today. Brain MRI 11/25/2005. FINDINGS: Brain: Cerebral volume remains normal for age, similar to the 2007 MRI. No midline shift, ventriculomegaly, mass effect, evidence of mass lesion, intracranial hemorrhage or evidence of cortically based acute infarction.  Patchy mild to moderate for age white matter hypodensity, most pronounced at the deep white matter capsules. Progression since 2007. Otherwise preserved gray-white differentiation. Vascular: No suspicious intracranial vascular hyperdensity. Calcified atherosclerosis at the skull base. Skull: No fracture identified. Sinuses/Orbits: Visualized paranasal sinuses and mastoids are clear. Other: Large right anterior scalp convexity, right forehead and supraorbital soft tissue hematoma up to 18 mm in thickness. Preseptal orbit involvement on these images. Face CT reported separately. No scalp soft tissue gas. Underlying right frontal bone appears intact. IMPRESSION: 1. Large right anterior convexity scalp hematoma. No underlying skull fracture. Face CT reported separately. 2. No acute intracranial abnormality. But progressed since 2007, mild to moderate for age white matter changes most commonly due to small vessel disease. Electronically Signed   By: Odessa Fleming M.D.   On: 09/13/2023 06:35   DG Shoulder Right Portable  Result Date: 09/13/2023 CLINICAL DATA:  72 year old female with nausea at 0230 hours. Subsequent syncope. Right eye hematoma. EXAM: RIGHT SHOULDER - 1 VIEW COMPARISON:  Chest radiographs 04/05/2018. FINDINGS: Three views at 0607 hours. No glenohumeral joint dislocation. Moderate to severe glenohumeral joint space loss and degenerative spurring. Proximal right humerus appears  intact. Right clavicle and scapula appear intact. Right AC joint is aligned with mild to moderate degenerative spurring. Negative visible right ribs and chest. IMPRESSION: 1. No acute fracture or dislocation identified about the right shoulder. 2. Glenohumeral > AC joint degeneration. Electronically Signed   By: Odessa Fleming M.D.   On: 09/13/2023 06:24   DG Chest Portable 1 View  Result Date: 09/13/2023 CLINICAL DATA:  72 year old female with nausea at 0230 hours. Subsequent syncope. Right eye hematoma. EXAM: PORTABLE CHEST 1 VIEW COMPARISON:  Chest radiographs 04/05/2018. FINDINGS: Portable AP upright view at 0610 hours. New left total shoulder arthroplasty. Lung volumes and mediastinal contours remain within normal limits. Visualized tracheal air column is within normal limits. Allowing for portable technique the lungs are clear. No pneumothorax or pleural effusion. Right glenohumeral and AC joint degeneration. No acute osseous abnormality identified. Paucity of bowel gas in the visible abdomen. IMPRESSION: No acute cardiopulmonary abnormality or acute traumatic injury identified. Electronically Signed   By: Odessa Fleming M.D.   On: 09/13/2023 06:22    Microbiology: Results for orders placed or performed during the hospital encounter of 09/22/22  Aerobic/Anaerobic Culture w Gram Stain (surgical/deep wound)     Status: None   Collection Time: 09/22/22 12:25 PM   Specimen: Abscess  Result Value Ref Range Status   Specimen Description ABSCESS  Final   Special Requests PELVIS  Final   Gram Stain NO WBC SEEN NO ORGANISMS SEEN   Final   Culture   Final    No growth aerobically or anaerobically. Performed at [facility], [address] [place], [place] [address]    Report Status 09/27/2022 FINAL  Final  Aerobic/Anaerobic Culture w Gram Stain (surgical/deep wound)     Status: None   Collection Time: 09/22/22 12:26 PM   Specimen: Abscess  Result Value Ref Range Status   Specimen Description ABSCESS   Final   Special Requests PELVIS  Final   Gram Stain NO WBC SEEN NO ORGANISMS SEEN   Final   Culture   Final    No growth aerobically or anaerobically. Performed at [facility], [address] [place], [place] [address]    Report Status 09/27/2022 FINAL  Final    Labs: CBC: Recent Labs  Lab 09/13/23 0558 09/14/23 0445  WBC 13.8* 6.9  NEUTROABS 12.3*  --   HGB 14.2 11.9*  HCT 42.8 35.6*  MCV 101.2* 100.3*  PLT 245 208   Basic Metabolic Panel: Recent Labs  Lab 09/13/23 0836  NA 136  K 4.0  CL 103  CO2 26  GLUCOSE 126*  BUN 22  CREATININE 0.67  CALCIUM 8.2*   Liver Function Tests: Recent Labs  Lab 09/13/23 0836  AST 31  ALT 11  ALKPHOS 40  BILITOT 0.8  PROT 6.2*  ALBUMIN 3.6   CBG: Recent Labs  Lab 09/14/23 0454  GLUCAP 95    Discharge time spent: greater than 30 minutes.  Signed: Tyrone Nine, MD [facility] 09/14/2023

## 2023-09-14 NOTE — Plan of Care (Signed)
  Problem: Education: Goal: Knowledge of condition and prescribed therapy will improve Outcome: Progressing   

## 2024-05-01 ENCOUNTER — Other Ambulatory Visit: Payer: Self-pay | Admitting: Infectious Diseases

## 2024-05-01 DIAGNOSIS — R1312 Dysphagia, oropharyngeal phase: Secondary | ICD-10-CM

## 2024-05-14 ENCOUNTER — Ambulatory Visit
Admission: RE | Admit: 2024-05-14 | Discharge: 2024-05-14 | Disposition: A | Discharge status: Home / Self Care | Source: Ambulatory Visit | Attending: Infectious Diseases | Admitting: Infectious Diseases

## 2024-05-14 DIAGNOSIS — R1312 Dysphagia, oropharyngeal phase: Secondary | ICD-10-CM | POA: Diagnosis present

## 2024-05-14 NOTE — Therapy (Signed)
 Modified Barium Swallow Study  Patient Details  Name: Kelsey Woods MRN: [id number] Date of Birth: 04/15/1951  Today's Date: 05/14/2024  Modified Barium Swallow completed.  Full report located under Chart Review in the Imaging Section.  History of Present Illness Pt is a 73 y.o. female who presents for MBSS. Per Dr. Kathren, pt with Mild dysphagia- likely from prior trauma recently. She had CT neck neg 09/13/23. CT neck, 1. No acute traumatic injury identified in the cervical spine.  2. Advanced cervical spine degeneration superimposed on degenerative  C4-C5 facet ankylosis. Mild spinal stenosis suspected at C6-C7. PMHx arthritis, CKD, depression and anxiety, syncope, SOB, and morbid obesity, alpha gal, and sleep apnea.   Clinical Impression MBSS was functional with age-related changes appreciated. Concern for possible esophageal component to patient's dysphagia given complaints of intermittent globus with solids despite esophageal sweep being unremarkable. Recommend continuation of a regular diet with thin liquids with general aspiration precautions and reflux precautions. Consider further GI work up given complaints.  Factors that may increase risk of adverse event in presence of aspiration Noe [employer] Lianne 2021): Limited mobility  Swallow Evaluation Recommendations Recommendations: PO diet PO Diet Recommendation: Regular;Thin liquids (Level 0) Liquid Administration via: Spoon;Cup;Straw Medication Administration:  (as tolerated) Supervision: Patient able to self-feed Swallowing strategies  : Slow rate;Small bites/sips;Follow solids with liquids Postural changes: Position pt fully upright for meals (upright 60-90 minutes after meals) Oral care recommendations: Oral care BID (2x/day) Recommended consults: Consider GI consultation;Consider esophageal assessment    Kelsey Woods, M.S., CCC-SLP Speech-Language Pathologist [place] [facility] [phone] (ASCOM)   Kelsey HERO Aowyn Rozeboom 05/14/2024,1:47 PM

## 2024-05-23 ENCOUNTER — Other Ambulatory Visit: Payer: Self-pay | Admitting: Neurology

## 2024-05-23 DIAGNOSIS — G44321 Chronic post-traumatic headache, intractable: Secondary | ICD-10-CM

## 2024-05-23 DIAGNOSIS — G629 Polyneuropathy, unspecified: Secondary | ICD-10-CM

## 2024-05-28 ENCOUNTER — Ambulatory Visit
Admission: RE | Admit: 2024-05-28 | Discharge: 2024-05-28 | Disposition: A | Discharge status: Home / Self Care | Source: Ambulatory Visit | Attending: Neurology | Admitting: Neurology

## 2024-05-28 DIAGNOSIS — G629 Polyneuropathy, unspecified: Secondary | ICD-10-CM | POA: Insufficient documentation

## 2024-05-28 DIAGNOSIS — G44321 Chronic post-traumatic headache, intractable: Secondary | ICD-10-CM | POA: Insufficient documentation

## 2024-05-28 MED ORDER — GADOBUTROL 1 MMOL/ML IV SOLN
10.0000 mL | Freq: Once | INTRAVENOUS | Status: AC | PRN
  Administered 2024-05-28: 10 mL via INTRAVENOUS

## 2024-05-31 ENCOUNTER — Other Ambulatory Visit: Payer: Self-pay | Admitting: Family Medicine

## 2024-05-31 DIAGNOSIS — M5416 Radiculopathy, lumbar region: Secondary | ICD-10-CM

## 2024-07-13 ENCOUNTER — Ambulatory Visit
Admission: RE | Admit: 2024-07-13 | Discharge: 2024-07-13 | Disposition: A | Discharge status: Home / Self Care | Source: Ambulatory Visit | Attending: Family Medicine | Admitting: Family Medicine

## 2024-07-13 DIAGNOSIS — M5416 Radiculopathy, lumbar region: Secondary | ICD-10-CM

## 2024-08-01 NOTE — Progress Notes (Signed)
 [facility] - Physiatry  PROCEDURE: 1. A Right L4-5 transforaminal epidural injection under fluoroscopic guidance. 2. Use of fluoroscopic guidance was required to ensure adequate delivery of medication into the epidural space and for proper needle placement. 3. The patient was monitored with continuous pulse oximetry throughout the procedure.   DIAGNOSIS/INDICATION FOR PROCEDURE: The patient is a pleasant 73 year-old female followed by [facility] for acute on chronic right low back pain with radiation to the right posterior lateral thigh.  Clinically her symptoms are consistent with a lumbar radiculitis.   MRI lumbar spine without contrast dated 07/13/2024 imaging and report reviewed today.  TECHNIQUE: Multiplanar multisequence MRI of the lumbar spine was performed without the administration of intravenous contrast. COMPARISON: Lumbar spine MRI 07/06/2020. CLINICAL HISTORY: Lumbar radiculopathy; NKI; No surgery. FINDINGS: BONES AND ALIGNMENT: Conventional lumbosacral anatomy is assumed with 5 non-rib-bearing, lumbar-type vertebral bodies. Unchanged 3 mm anterolisthesis of L4 on L5. SPINAL CORD: Conus terminates at L2. SOFT TISSUES: Moderate fatty atrophy of the paraspinal muscles.  L1-L2: Small disc bulge and mild bilateral facet arthropathy. No spinal canal stenosis or neural foraminal narrowing. L2-L3: Small, left eccentric disc bulge and facet arthropathy without spinal canal stenosis or neural foraminal narrowing. L3-L4: Disc bulge and moderate bilateral facet arthropathy result in no more than mild spinal canal stenosis. No neural foraminal narrowing. L4-L5: Anterolisthesis with uncovertebral disc and moderate bilateral facet arthropathy contribute to moderate narrowing of the right lateral recess with mass effect on the traversing right L5 nerve root in the subarticular zone. L5-S1: Moderate bilateral facet arthropathy. No significant  disc herniation, spinal canal stenosis or neural foraminal narrowing.  IMPRESSION: 1. Moderate narrowing of the right lateral recess at L4-5 with mass effect on the traversing right L5 nerve root in the subarticular zone. 2. Mild spinal canal stenosis at L3-4.  Electronically signed by: Ryan Chess MD 07/13/2024 01:32 PM EDT   Her pain is rated 8/10  Procedures: 08/01/2024: Right L4-5 transforaminal ESI 03/17/2021: Left L5-S1 and left S1 transforaminal ESI 02/24/2021: Right trapezius ridge trigger point injection 08/06/2020: Left L5-S1 and left S1 transforaminal ESI (moderate relief, ODI: 51%) 07/15/2020: Left L5-S1 and left S1 transforaminal ESI (mild to moderate relief, ODI: 58%) 09/26/2015: Bilateral L4-5 transforaminal ESI (80% relief)   IMPRESSION/RESULTS: Patient tolerated the procedure well without any complications. We used a 5 inch needle and 2% lidocaine .  Immediately after the procedure she rated her pain 3/10.  She will follow up with [facility] in 3 to 4 weeks.       INFORMED CONSENT: The patient understood the potential risks and benefits of the procedure, which were explained to the patient prior to the procedure.  The patient read and signed the consent stating complete understanding of the information, and wished to proceed with the procedure, there were no barriers to understanding.  Ample time was given for any questions to be answered prior to the procedure. The risks of the procedure were explained including, but not limited to the risk of bleeding and/or infection into the spinal area, intervertebral discs, or epidural space; nerve injury; nerve irritation; reaction to medications; etc.  The patient denied any history of bleeding disorders, allergies to medications used or other medical contraindications to the procedure.  No promises were given to any expected outcome.   PROCEDURE: A time out was performed by physician and assistant.  Correct patient, procedure,  plan of care, site, position, and equipment were verified.  The patient was sterilely prepped with a  triple scrub of betadine solution and draped in the prone position.  Careful attention was paid to aseptic technique throughout the procedure.  Then, the Right L4-5 neuroforamen was identified under fluoroscopic guidance.  The overlying skin and subcutaneous tissues were anesthetized with approximately 2 cc of 1% Xylocaine .  A 25 gauge spinal needle was inserted down into the foramen.  Approximately 2 cc of Omnipaque -240 mgI/mL contrast was infiltrated under real time-fluoroscopy demonstrating satisfactory spread along the exiting spinal nerve and into the epidural space without vascular uptake.  No aspirate was noted.  Spot films were taken.  A solution containing 1.0 cc of dexamethasone  sodium phosphate  10 mg/cc and 1.0 cc of 2% preservative-free lidocaine  was slowly infiltrated around the spinal nerve and into the epidural space under real-time fluoroscopy.  There was good contrast washout.  The needle was removed.  There were no complications.   DISCHARGE SUMMARY: The patient was monitored post-injection for 30 minutes in the recovery area and remained stable without evidence of complications.  After the procedure, the patient ambulated in and from the office without difficulty.  The patient was discharged with discharge instructions in stable condition.  Patient was instructed to contact us  with any problems.  Procedure fluoro time: 31 seconds. Fluoroscopy dose: 15.47 mGy.  This is below the dose threshold #1 (5000 mGy).    BENJAMIN CHARLES CHASNIS, DO

## 2024-08-13 NOTE — Progress Notes (Signed)
 Chief Complaint  Patient presents with  . Neck Pain    Radiates to left side of neck - red swollen spot - noticed on Saturday    Patient is agreeable to [facility] scribe.   History of Present Illness Kelsey Woods is a 73 year old female who presents with a painful, enlarging lesion on her neck.  A painful, enlarging lesion appeared on her neck on Saturday morning, initially thought to be a bug bite or a pimple. Over the past two days, it has progressively worsened, becoming larger and more painful, with a sensation of it moving down her neck. The significant pain prevented her from sleeping last night.  She had her hair cut last Thursday, which she believes may have triggered the issue. She applied Mepron, an ointment previously prescribed for her husband, to the area last night, which provided minimal relief.  No fevers or chills, but the area has been warm. She applied a hot compress last night to alleviate the pain.  She has a known allergy to sulfa, specifically in eye drops, and is concerned about developing a yeast infection from antibiotic use, as she has experienced this in the past. She typically requires a Diflucan  pill to manage this side effect.    ROS  Review of systems is unremarkable for any active cardiac, respiratory, GI, GU, hematologic, neurologic, dermatologic, HEENT, or psychiatric symptoms except as noted above.  No fevers, chills, or constitutional symptoms.   Current Outpatient Medications  Medication Sig Dispense Refill  . acetaminophen  (TYLENOL ) 500 MG tablet Take 500 mg by mouth every 4 (four) hours    . ALPHA LIPOIC ACID ORAL Take 600 mg by mouth once daily    . aspirin  81 MG EC tablet Take 81 mg by mouth once daily      . CRANBERRY ORAL Take 600 mg by mouth once daily    . cyanocobalamin (VITAMIN B12) 1000 MCG tablet Take 1,000 mcg by mouth once daily    . EPINEPHrine  (EPIPEN ) 0.3 mg/0.3 mL pen injector Inject 0.3 mg into the muscle as directed     . fluticasone  propionate (FLONASE ) 50 mcg/actuation nasal spray USE ONE SPRAY INTO EACH NOSTRIL TWICE A DAY 16 g 0  . magnesium  oxide (MAG-OX) 400 mg (241.3 mg magnesium ) tablet Take 400 mg by mouth once daily    . meloxicam (MOBIC) 7.5 MG tablet TAKE 1 TABLET BY MOUTH TWICE DAILY 60 tablet 1  . VITAMIN D3 ORAL Take 1,000 mg by mouth once daily    . doxycycline (VIBRA-TABS) 100 MG tablet Take 1 tablet (100 mg total) by mouth 2 (two) times daily for 10 days 20 tablet 0  . fluconazole  (DIFLUCAN ) 150 MG tablet Take 1 tablet (150 mg total) by mouth once for 1 dose 1 tablet 0   No current facility-administered medications for this visit.    Allergies as of 08/13/2024 - Reviewed 08/13/2024  Allergen Reaction Noted  . Aleve [naproxen sodium] Swelling   . Vioxx [rofecoxib] Other (See Comments) 12/21/2023  . Alpha-gal (galactose-alpha-1,3-galactose) Nausea And Vomiting 01/13/2018  . Sulfa (sulfonamide antibiotics) Other (See Comments)     Patient Active Problem List  Diagnosis  . Knee joint replacement status  . Sleep apnea  . Arthritis  . History of hypothyroidism  . DDD (degenerative disc disease), lumbar  . Lumbar radiculitis  . Impingement syndrome of both shoulders  . Status post total replacement of right hip  . Bilateral carpal tunnel syndrome  . Rotator cuff tendinitis,  left  . Status post reverse total shoulder replacement, left  . Morbid obesity with body mass index (BMI) of 40.0 or higher (CMS-HCC)  . Other chest pain  . SOB (shortness of breath)  . Syncope, vasovagal    Past Medical History:  Diagnosis Date  . Arthritis   . Chronic kidney disease    Nephrolithiasis  . COVID-19 09/2019  . Depression    Anxiety  . H/O multiple allergies   . Obesity   . Periodic limb movement   . S/P colonoscopy 11/09/2011  . Shingles   . Sleep apnea    CPAP  . Varicella     Past Surgical History:  Procedure Laterality Date  . CHOLECYSTECTOMY  2004  . KNEE ARTHROSCOPY   01/2003   Right knee arthroscopy  . Right total hip arthroplasty  04/15/2005  . Right total knee arthroplasty  05/11/2007  . COMBINED HYSTEROSCOPY DIAGNOSTIC / D&C  2012  . COLONOSCOPY  2013  . Left total knee arthroplasty using computer-assisted navigation Left 04/22/14  . Reverse left total shoulder arthroplasty  Left 06/15/2018   Dr.Poggi   . EXTRACTION CATARACT EXTRACAPSULAR W/INSERTION INTRAOCULAR PROSTHESIS Left 07/27/2023   Procedure: Left - EXTRACTION CATARACT EXTRACAPSULAR WITH PHACO WITH INSERTION INTRAOCULAR PROSTHESIS;  Surgeon: Thurman Therisa Cohn, MD;  Location: [facility];  Service: Ophthalmology;  Laterality: Left;  . EXTRACTION CATARACT EXTRACAPSULAR W/INSERTION INTRAOCULAR PROSTHESIS Right 08/17/2023   Procedure: Right - EXTRACTION CATARACT EXTRACAPSULAR WITH PHACO WITH INSERTION INTRAOCULAR PROSTHESIS;  Surgeon: Thurman Therisa Cohn, MD;  Location: [facility];  Service: Ophthalmology;  Laterality: Right;  . CESAREAN SECTION  1983 and 1985   x2  . Ganglion excised    . Septal deviation repair      Vitals:   08/13/24 1240  BP: 128/78  Pulse: 98  SpO2: 99%  Weight: (!) 121.8 kg (268 lb 9.6 oz)  Height: 170.2 cm (5' 7)  PainSc:   8  PainLoc: Neck   Body mass index is 42.07 kg/m.  Exam BP 128/78 (BP Location: Left upper arm, Patient Position: Sitting, BP Cuff Size: Large Adult)   Pulse 98   Ht 170.2 cm (5' 7)   Wt (!) 121.8 kg (268 lb 9.6 oz)   SpO2 99%   BMI 42.07 kg/m   General. Well appearing; NAD; VS reviewed     Eyes. Sclera and conjunctiva clear; Vision grossly intact; extraocular movements intact Oropharynx. No suspicious lesions Neck. Supple. No swelling, masses, thyroid normal size, no masses palpated.   Lungs. Respirations unlabored; clear to auscultation bilaterally Cardiovascular. Heart regular rate and rhythm without murmurs, gallops, or rubs Extremities: without edema and with 2+ pulses bilaterally Skin. Normal color and turgor; small area  of erythema on the inferior portion of the hairline.  1 hair follicle appears to be inflamed with some erythema measuring 3 cm x 1 cm.  Indurated skin but no notable abscess.  No drainage at this time. Neurologic. Alert and oriented x3; CN 2-12 grossly intact; no focal deficits   Assessment & Plan  Cellulitis Painful, erythematous slightly swollen but nonfluctuant. No systemic symptoms.  Will go and place referral to general surgeon in case abscess formation happens. - Prescribed doxycycline with food twice daily. - Advised warm compresses for natural drainage. - Referred to general surgeon for potential drainage if no improvement. - Instructed to seek immediate care for high fevers or spreading redness.  Antibiotic-associated vulvovaginal candidiasis prophylaxis Develops yeast infections with antibiotics, not allergic to Diflucan . - Prescribed Diflucan  as  a one-time prophylactic dose.    F/U: Patient to follow-up as needed.  JASON HESTLE WHITAKER, PA  This note has been created using automated tools and reviewed for accuracy by JASON HESTLE WHITAKER.   Note: This dictation was prepared with Dragon dictation along with smaller phrase technology. Any transcriptional errors that result from this process are unintentional.
